# Patient Record
Sex: Female | Born: 1975 | Race: White | Hispanic: No | Marital: Married | Smoking: Never smoker
Health system: Southern US, Community
[De-identification: ages and names within clinical notes are randomized; demographics above are authoritative.]

## PROBLEM LIST (undated history)

## (undated) DIAGNOSIS — Z923 Personal history of irradiation: Secondary | ICD-10-CM

## (undated) DIAGNOSIS — Z8042 Family history of malignant neoplasm of prostate: Secondary | ICD-10-CM

## (undated) DIAGNOSIS — Z808 Family history of malignant neoplasm of other organs or systems: Secondary | ICD-10-CM

## (undated) DIAGNOSIS — K219 Gastro-esophageal reflux disease without esophagitis: Secondary | ICD-10-CM

## (undated) DIAGNOSIS — F329 Major depressive disorder, single episode, unspecified: Secondary | ICD-10-CM

## (undated) DIAGNOSIS — Z8049 Family history of malignant neoplasm of other genital organs: Secondary | ICD-10-CM

## (undated) DIAGNOSIS — T7840XA Allergy, unspecified, initial encounter: Secondary | ICD-10-CM

## (undated) DIAGNOSIS — F32A Depression, unspecified: Secondary | ICD-10-CM

## (undated) HISTORY — PX: ADENOIDECTOMY: SHX5191

## (undated) HISTORY — DX: Family history of malignant neoplasm of prostate: Z80.42

## (undated) HISTORY — DX: Allergy, unspecified, initial encounter: T78.40XA

## (undated) HISTORY — DX: Depression, unspecified: F32.A

## (undated) HISTORY — DX: Family history of malignant neoplasm of other genital organs: Z80.49

## (undated) HISTORY — DX: Gastro-esophageal reflux disease without esophagitis: K21.9

## (undated) HISTORY — DX: Family history of malignant neoplasm of other organs or systems: Z80.8

## (undated) HISTORY — PX: APPENDECTOMY: SHX54

## (undated) HISTORY — DX: Major depressive disorder, single episode, unspecified: F32.9

## (undated) HISTORY — PX: BREAST LUMPECTOMY: SHX2

---

## 1998-05-05 ENCOUNTER — Encounter: Payer: Self-pay | Admitting: Emergency Medicine

## 1998-05-05 ENCOUNTER — Emergency Department (HOSPITAL_COMMUNITY): Admission: EM | Admit: 1998-05-05 | Discharge: 1998-05-05 | Payer: Self-pay | Admitting: Emergency Medicine

## 2000-11-05 ENCOUNTER — Encounter: Admission: RE | Admit: 2000-11-05 | Discharge: 2000-11-05 | Payer: Self-pay | Admitting: Endocrinology

## 2000-11-05 ENCOUNTER — Encounter: Payer: Self-pay | Admitting: Endocrinology

## 2000-11-06 ENCOUNTER — Encounter: Payer: Self-pay | Admitting: Endocrinology

## 2000-11-06 ENCOUNTER — Encounter: Admission: RE | Admit: 2000-11-06 | Discharge: 2000-11-06 | Payer: Self-pay | Admitting: Endocrinology

## 2000-12-25 ENCOUNTER — Encounter (INDEPENDENT_AMBULATORY_CARE_PROVIDER_SITE_OTHER): Payer: Self-pay | Admitting: *Deleted

## 2000-12-25 ENCOUNTER — Observation Stay (HOSPITAL_COMMUNITY): Admission: EM | Admit: 2000-12-25 | Discharge: 2000-12-26 | Payer: Self-pay | Admitting: Emergency Medicine

## 2002-07-20 ENCOUNTER — Other Ambulatory Visit: Admission: RE | Admit: 2002-07-20 | Discharge: 2002-07-20 | Payer: Self-pay | Admitting: Obstetrics and Gynecology

## 2003-09-13 ENCOUNTER — Ambulatory Visit (HOSPITAL_COMMUNITY): Admission: RE | Admit: 2003-09-13 | Discharge: 2003-09-13 | Payer: Self-pay | Admitting: Obstetrics and Gynecology

## 2003-09-13 ENCOUNTER — Encounter (INDEPENDENT_AMBULATORY_CARE_PROVIDER_SITE_OTHER): Payer: Self-pay | Admitting: Specialist

## 2003-09-26 ENCOUNTER — Other Ambulatory Visit: Admission: RE | Admit: 2003-09-26 | Discharge: 2003-09-26 | Payer: Self-pay | Admitting: Obstetrics and Gynecology

## 2004-09-13 ENCOUNTER — Encounter (INDEPENDENT_AMBULATORY_CARE_PROVIDER_SITE_OTHER): Payer: Self-pay | Admitting: *Deleted

## 2004-09-13 ENCOUNTER — Inpatient Hospital Stay (HOSPITAL_COMMUNITY): Admission: AD | Admit: 2004-09-13 | Discharge: 2004-09-16 | Payer: Self-pay | Admitting: Obstetrics and Gynecology

## 2004-10-23 ENCOUNTER — Other Ambulatory Visit: Admission: RE | Admit: 2004-10-23 | Discharge: 2004-10-23 | Payer: Self-pay | Admitting: Obstetrics and Gynecology

## 2008-01-07 ENCOUNTER — Encounter: Admission: RE | Admit: 2008-01-07 | Discharge: 2008-01-31 | Payer: Self-pay | Admitting: Psychiatry

## 2008-02-04 ENCOUNTER — Encounter: Admission: RE | Admit: 2008-02-04 | Discharge: 2008-04-10 | Payer: Self-pay | Admitting: Psychiatry

## 2008-05-29 ENCOUNTER — Encounter: Admission: RE | Admit: 2008-05-29 | Discharge: 2008-08-27 | Payer: Self-pay | Admitting: Psychiatry

## 2010-08-16 NOTE — Op Note (Signed)
NAME:  [name], [name]              ACCOUNT NO.:  [id number]   MEDICAL RECORD NO.:  [id number]          PATIENT TYPE:  INP   LOCATION:  9160                          FACILITY:  [facility]   PHYSICIAN:  Duke Salvia. Marcelle Overlie, M.D.DATE OF BIRTH:  02/17/1976   DATE OF PROCEDURE:  09/13/2004  DATE OF DISCHARGE:                                 OPERATIVE REPORT   PREOPERATIVE DIAGNOSES:  1.  Term intrauterine pregnancy, history of positive GBS.  2.  Chorioamnionitis.  3.  Severe fetal tachycardia with fetal intolerance of labor.   POSTOPERATIVE DIAGNOSES:  1.  Term intrauterine pregnancy, history of positive GBS.  2.  Chorioamnionitis.  3.  Severe fetal tachycardia with fetal intolerance of labor.  4.  OP presentation.   PROCEDURE:  Primary low transverse cesarean section.   SURGEON:  Dr. Marcelle Overlie   ANESTHESIA:  Epidural.   COMPLICATIONS:  None.   DRAINS:  Foley catheter.   ESTIMATED BLOOD LOSS:  800.   PROCEDURE AND FINDINGS:  The patient to the operating room.  After an  adequate level of epidural anesthesia was obtained with the patient in left  tilt position, the prepped and draped in the usual manner for sterile  abdominal procedures.  Foley catheter positioned draining clear urine.  She  had been on IV ampicillin during her labor, immediately prepartum had a 103  fever with persistent fetal tachycardia in the 210-220 range with decreased  variability.  Decision made to proceed with cesarean section.   After prepping and draping, tranverse Pfannenstiel incision made 2  fingerbreadths above the symphysis, carried down to the fascia which was  incised and extended transversely.  Rectus muscles divided in the midline.  Peritoneum entered superiorly without incident and extended in a vertical  fashion.  The vesicouterine serosa was then incised, and the bladder was  bluntly and sharply dissected off of the lower uterine segment, and a  bladder blade was repositioned.  A transverse  incision made in the lower  uterine segment which was extended with blunt dissection.  Clear fluid was  then noted.  The fetus was noted to be straight OP, was easily rotated to  effect easy delivery of a female.  Apgars were 8 and 9, cord pH 7.29.  The  infant was suctioned, cord clamped, and passed to pediatric team for further  care.  Placenta was delivered manually intact, sent to pathology, uterus  exteriorized, cavity wiped clean with a laparotomy pack, closure obtained in  the first layer of 0 chromic interlocked fashion followed by an imbricating  layer of 0 chromic sutures were used to reapproximate this laceration with  good hemostasis.  After this was sutured initially by palpation, could  palpate the ureter lateral and below this operative site.  The remainder of  the incision was closed with running locked 0 chromic suture followed by an  imbricating later of 0 chromic.  This was hemostatic, and the bladder flap  area was intact and hemostatic.  Tubes and ovaries were normal.  Prior to  closure, sponge, needle, and instrument counts were reported as correct x 2.  The peritoneum closed with a running 3-0 Vicryl suture.  Rectus muscles  reapproximated in the midline with 3-0 Vicryl interrupted sutures.  Fascia  closed from laterally to midline on either side with a 0 PDS suture.  Subcutaneous fat was hemostatic.  Clips and Steri-Strips used on the skin.  She tolerated this well and went to recovery room in good condition.  She  did receive Unasyn 3 g IV after the cord was clamped, and this will be  continued.       RMH/MEDQ  D:  09/13/2004  T:  09/13/2004  Job:  [id number]

## 2010-08-16 NOTE — Discharge Summary (Signed)
NAME:  [name], [name]              ACCOUNT NO.:  [id number]   MEDICAL RECORD NO.:  [id number]          PATIENT TYPE:  INP   LOCATION:  9110                          FACILITY:  [facility]   PHYSICIAN:  Guy Sandifer. Henderson Cloud, M.D. DATE OF BIRTH:  05-13-1975   DATE OF ADMISSION:  09/13/2004  DATE OF DISCHARGE:  09/16/2004                                 DISCHARGE SUMMARY   ADMITTING DIAGNOSIS:  1.  Intrauterine pregnancy at term  2.  Spontaneous onset of labor  3.  History of positive group B beta strep.   DISCHARGE DIAGNOSIS:  1.  Status post low transverse cesarean section secondary to fetal      intolerance of labor  2.  Chorioamnionitis  3.  Viable female infant.   PROCEDURE:  Primary low transverse cesarean section.   REASON FOR ADMISSION:  Please see written H&P.   HOSPITAL COURSE:  The patient is 35 year old gravida 2, para 0 that was  admitted to [facility] in labor. Pregnancy had been  otherwise uncomplicated. 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  On admission vital signs were stable with temperature 98.9, blood pressure  120/78. Fetal heart tones were reactive at 142 beats per minute, cervix was  examined which revealed cervical dilatation of 2 cm, 50% effaced vertex.  Artificial rupture of membranes was performed revealing clear fluid. The  patient was started on IV antibiotics prophylactically. An epidural was  administered for pain control. Later that afternoon maternal temperature was  noted to be 103 with persistent fetal tachycardia greater than 200 beats per  minute. No obvious decelerations but decreased beat-to-beat variability.  Decision was made to proceed with the primary cesarean delivery. The patient  was then transferred to the operating room where epidural was dosed to an  adequate surgical level. A low transverse incision was made with delivery of  a viable female infant weighing 7 pounds 3 ounces Apgars of 7 at one minute  and 8 at 5  minutes. Arterial cord pH of 7.29. The patient tolerated  procedure well and taken to the recovery room in stable condition. On  postoperative day #1 the patient was without complaint. Vital signs stable.  She was afebrile. Abdomen soft with good return of bowel function.  Laboratory findings revealed hemoglobin of 10.4. WBC count of 17.2. The  patient continued on IV Unasyn. On postoperative day #2 the patient was  without complaint. She was afebrile. Abdomen soft. Abdominal dressing had  been removed revealing an incision that is clean, dry and intact. Fundus was  firm and nontender. IV antibiotics were discontinued and the patient was  started on oral Augmentin. On postoperative day #3 the patient was without  complaint. Vital signs were stable. She was afebrile. Abdomen soft. Fundus  was firm and nontender and decision was clean, dry and intact. Staples were  removed and the patient was discharged home.   CONDITION ON DISCHARGE:  Good.   DIET:  Regular as tolerated.   ACTIVITY:  No heavy lifting, no driving x2 weeks, no vaginal entry.   FOLLOW-UP:  Patient follow up in  the office in 1-2 weeks for incision check.  She is to call for temperature greater than 100 degrees, persistent nausea  and vomiting, heavy vaginal bleeding and/or redness or drainage from  incisional site.   DISCHARGE MEDICATIONS:  1.  Percocet 5/325 #30 one p.o. every 4 to 6 hours p.r.n.  2.  Motrin 600 milligrams every 6 hours  3.  Augmentin 875 milligrams one p.o. b.i.d. x7 days.      Julio Sicks, N.P.      Guy Sandifer. Henderson Cloud, M.D.  Electronically Signed    CC/MEDQ  D:  10/21/2004  T:  10/21/2004  Job:  [id number]

## 2011-04-21 ENCOUNTER — Encounter (INDEPENDENT_AMBULATORY_CARE_PROVIDER_SITE_OTHER): Payer: 59 | Admitting: Family Medicine

## 2011-04-21 ENCOUNTER — Encounter: Payer: Self-pay | Admitting: Family Medicine

## 2011-04-21 DIAGNOSIS — Z0000 Encounter for general adult medical examination without abnormal findings: Secondary | ICD-10-CM

## 2011-04-21 DIAGNOSIS — F329 Major depressive disorder, single episode, unspecified: Secondary | ICD-10-CM | POA: Insufficient documentation

## 2011-04-21 DIAGNOSIS — F418 Other specified anxiety disorders: Secondary | ICD-10-CM | POA: Insufficient documentation

## 2011-04-21 DIAGNOSIS — G43009 Migraine without aura, not intractable, without status migrainosus: Secondary | ICD-10-CM | POA: Insufficient documentation

## 2011-04-21 DIAGNOSIS — K219 Gastro-esophageal reflux disease without esophagitis: Secondary | ICD-10-CM

## 2011-07-28 ENCOUNTER — Other Ambulatory Visit: Payer: Self-pay | Admitting: Family Medicine

## 2011-11-20 ENCOUNTER — Other Ambulatory Visit: Payer: Self-pay | Admitting: Internal Medicine

## 2011-12-11 ENCOUNTER — Other Ambulatory Visit: Payer: Self-pay | Admitting: Internal Medicine

## 2012-05-17 ENCOUNTER — Other Ambulatory Visit: Payer: Self-pay | Admitting: Physician Assistant

## 2012-07-09 ENCOUNTER — Other Ambulatory Visit: Payer: Self-pay | Admitting: Physician Assistant

## 2012-07-09 NOTE — Telephone Encounter (Signed)
LMOM to CB w/follow-up plans. Has been over a year since last OV.

## 2012-07-09 NOTE — Telephone Encounter (Signed)
Pt called back and reported that she did not get the message on prescriptions that she was due for f/up. I transferred pt to 104 to set up appt to est care w/new provider. She will try to see someone w/in the month. Can we send in a RF of these meds?

## 2012-07-13 ENCOUNTER — Encounter: Payer: Self-pay | Admitting: Family Medicine

## 2012-07-13 ENCOUNTER — Other Ambulatory Visit: Payer: Self-pay | Admitting: Physician Assistant

## 2012-07-13 ENCOUNTER — Ambulatory Visit (INDEPENDENT_AMBULATORY_CARE_PROVIDER_SITE_OTHER): Payer: 59 | Admitting: Family Medicine

## 2012-07-13 VITALS — BP 126/82 | HR 104 | Temp 98.4°F | Resp 18 | Ht 63.5 in | Wt 239.0 lb

## 2012-07-13 DIAGNOSIS — K219 Gastro-esophageal reflux disease without esophagitis: Secondary | ICD-10-CM

## 2012-07-13 DIAGNOSIS — H60399 Other infective otitis externa, unspecified ear: Secondary | ICD-10-CM

## 2012-07-13 DIAGNOSIS — H60391 Other infective otitis externa, right ear: Secondary | ICD-10-CM

## 2012-07-13 DIAGNOSIS — Z760 Encounter for issue of repeat prescription: Secondary | ICD-10-CM

## 2012-07-13 DIAGNOSIS — F329 Major depressive disorder, single episode, unspecified: Secondary | ICD-10-CM

## 2012-07-13 MED ORDER — CEFUROXIME AXETIL 500 MG PO TABS: 500.0000 mg | ORAL_TABLET | Freq: Two times a day (BID) | ORAL | Status: DC

## 2012-07-13 MED ORDER — PANTOPRAZOLE SODIUM 40 MG PO TBEC: 40.0000 mg | DELAYED_RELEASE_TABLET | Freq: Every day | ORAL | Status: AC

## 2012-07-13 MED ORDER — ANTIPYRINE-BENZOCAINE 5.4-1.4 % OT SOLN: 3.0000 [drp] | OTIC | Status: DC | PRN

## 2012-07-13 MED ORDER — SERTRALINE HCL 100 MG PO TABS: ORAL_TABLET | ORAL | Status: DC

## 2012-07-13 NOTE — Patient Instructions (Signed)
Otitis Externa Otitis externa is a bacterial or fungal infection of the outer ear canal. This is the area from the eardrum to the outside of the ear. Otitis externa is sometimes called "swimmer's ear." CAUSES  Possible causes of infection include:  Swimming in dirty water.  Moisture remaining in the ear after swimming or bathing.  Mild injury (trauma) to the ear.  Objects stuck in the ear (foreign body).  Cuts or scrapes (abrasions) on the outside of the ear. SYMPTOMS  The first symptom of infection is often itching in the ear canal. Later signs and symptoms may include swelling and redness of the ear canal, ear pain, and yellowish-white fluid (pus) coming from the ear. The ear pain may be worse when pulling on the earlobe. DIAGNOSIS  Your caregiver will perform a physical exam. A sample of fluid may be taken from the ear and examined for bacteria or fungi. TREATMENT  Antibiotic ear drops are often given for 10 to 14 days. Treatment may also include pain medicine or corticosteroids to reduce itching and swelling. PREVENTION   Keep your ear dry. Use the corner of a towel to absorb water out of the ear canal after swimming or bathing.  Avoid scratching or putting objects inside your ear. This can damage the ear canal or remove the protective wax that lines the canal. This makes it easier for bacteria and fungi to grow.  Avoid swimming in lakes, polluted water, or poorly chlorinated pools.  You may use ear drops made of rubbing alcohol and vinegar after swimming. Combine equal parts of white vinegar and alcohol in a bottle. Put 3 or 4 drops into each ear after swimming. HOME CARE INSTRUCTIONS   Apply antibiotic ear drops to the ear canal as prescribed by your caregiver.  Only take over-the-counter or prescription medicines for pain, discomfort, or fever as directed by your caregiver.  If you have diabetes, follow any additional treatment instructions from your caregiver.  Keep all  follow-up appointments as directed by your caregiver. SEEK MEDICAL CARE IF:   You have a fever.  Your ear is still red, swollen, painful, or draining pus after 3 days.  Your redness, swelling, or pain gets worse.  You have a severe headache.  You have redness, swelling, pain, or tenderness in the area behind your ear. MAKE SURE YOU:   Understand these instructions.  Will watch your condition.  Will get help right away if you are not doing well or get worse. Document Released: 03/17/2005 Document Revised: 06/09/2011 Document Reviewed: 04/03/2011 [employer] Patient Information 2013 [employer].  

## 2012-07-14 ENCOUNTER — Emergency Department (HOSPITAL_COMMUNITY): Payer: 59

## 2012-07-14 ENCOUNTER — Encounter (HOSPITAL_COMMUNITY): Payer: Self-pay | Admitting: *Deleted

## 2012-07-14 DIAGNOSIS — Y9301 Activity, walking, marching and hiking: Secondary | ICD-10-CM | POA: Insufficient documentation

## 2012-07-14 DIAGNOSIS — K219 Gastro-esophageal reflux disease without esophagitis: Secondary | ICD-10-CM | POA: Insufficient documentation

## 2012-07-14 DIAGNOSIS — F3289 Other specified depressive episodes: Secondary | ICD-10-CM | POA: Insufficient documentation

## 2012-07-14 DIAGNOSIS — Y929 Unspecified place or not applicable: Secondary | ICD-10-CM | POA: Insufficient documentation

## 2012-07-14 DIAGNOSIS — F329 Major depressive disorder, single episode, unspecified: Secondary | ICD-10-CM | POA: Insufficient documentation

## 2012-07-14 DIAGNOSIS — W108XXA Fall (on) (from) other stairs and steps, initial encounter: Secondary | ICD-10-CM | POA: Insufficient documentation

## 2012-07-14 DIAGNOSIS — G43909 Migraine, unspecified, not intractable, without status migrainosus: Secondary | ICD-10-CM | POA: Insufficient documentation

## 2012-07-14 DIAGNOSIS — S82899A Other fracture of unspecified lower leg, initial encounter for closed fracture: Secondary | ICD-10-CM | POA: Insufficient documentation

## 2012-07-14 DIAGNOSIS — Z79899 Other long term (current) drug therapy: Secondary | ICD-10-CM | POA: Insufficient documentation

## 2012-07-14 MED ORDER — OXYCODONE-ACETAMINOPHEN 5-325 MG PO TABS
1.0000 | ORAL_TABLET | Freq: Once | ORAL | Status: AC
  Administered 2012-07-14: 1 via ORAL
  Filled 2012-07-14: qty 1

## 2012-07-14 NOTE — ED Notes (Signed)
Pt states she fell down 1 step around 8:30 this evening and twisted her right ankle. C/o right ankle pain and swelling.

## 2012-07-15 ENCOUNTER — Emergency Department (HOSPITAL_COMMUNITY)
Admission: EM | Admit: 2012-07-15 | Discharge: 2012-07-15 | Disposition: A | Discharge status: Home / Self Care | Payer: 59 | Attending: Emergency Medicine | Admitting: Emergency Medicine

## 2012-07-15 DIAGNOSIS — S82891A Other fracture of right lower leg, initial encounter for closed fracture: Secondary | ICD-10-CM

## 2012-07-15 MED ORDER — OXYCODONE-ACETAMINOPHEN 5-325 MG PO TABS
1.0000 | ORAL_TABLET | Freq: Once | ORAL | Status: AC
  Administered 2012-07-15: 1 via ORAL
  Filled 2012-07-15: qty 1

## 2012-07-15 MED ORDER — HYDROCODONE-ACETAMINOPHEN 5-325 MG PO TABS: 1.0000 | ORAL_TABLET | Freq: Four times a day (QID) | ORAL | Status: DC | PRN

## 2012-07-15 MED ORDER — NAPROXEN 500 MG PO TABS: 500.0000 mg | ORAL_TABLET | Freq: Two times a day (BID) | ORAL | Status: DC

## 2012-07-15 NOTE — Progress Notes (Signed)
S:  This 37 y.o. Cauc female is here for follow-up re: Depression and GERD. She is doing well on current medications; pt feels less anxious and irritable. Her mood is stable and she denies concentration or behavior difficulties. Sleep hygiene is better and suicidal ideation is absent. reflux symptoms recur very time she attempts to discontinue medication. Reflux is not food-dependent. Otherwise, GI function is normal.  Pt c/o ear discomfort w/o drainage or fever. Feels like swimmer's ear but pt has not been swimming. She tries to use Q-tip to remove excess water after bathing. Pt denies sore throat or nasal discharge.  ROS: As per HPI.  O: Filed Vitals:   07/13/12 1456  BP: 126/82  Pulse: 104  Temp: 98.4 F (36.9 C)  Resp: 18   GEN: In NAD; Wn,WD. HEENT: [place]/AT. EOMI w/ clear conj/sclerae. R EAC-erythematous and slightly edematous. TMs-normal. Nasal mucosa/ post ph normal. NECK: No LAN or TMG. COR: RRR. LUNGS: Normal resp rate and effort. ABD: Soft; mild epig tenderness w/o guarding, HSM, masses or rebound. SKIN: W&D. NEURO: A&O x 3; CNs intact. Nonfocal.  A/P: Depression- continue Sertraline.  GERD (gastroesophageal reflux disease)- continue medication w/ food anti-reflux measures.  Otitis, externa, infective, right- RX: Cefuroxime 500 mg bid x 7 days plus otic gtts.  Issue of repeat prescriptions  Meds ordered this encounter  Medications  . promethazine (PHENERGAN) 12.5 MG tablet    Sig: Take 12.5 mg by mouth every 6 (six) hours as needed for nausea.  . rizatriptan (MAXALT) 10 MG tablet    Sig: Take 10 mg by mouth as needed for migraine. May repeat in 2 hours if needed  . cyanocobalamin 100 MCG tablet    Sig: Take 100 mcg by mouth daily.  . Multiple Vitamin (MULTIVITAMIN) tablet    Sig: Take 1 tablet by mouth daily.  . pantoprazole (PROTONIX) 40 MG tablet    Sig: Take 1 tablet (40 mg total) by mouth daily.    Dispense:  90 tablet    Refill:  1  . DISCONTD: sertraline  (ZOLOFT) 100 MG tablet    Sig: TAKE 1&1/2 TABLETS ONCE DAILY.    Dispense:  140 tablet    Refill:  1  . antipyrine-benzocaine (AURALGAN) otic solution    Sig: Place 3 drops into the right ear every 2 (two) hours as needed for pain.    Dispense:  10 mL    Refill:  0  . cefUROXime (CEFTIN) 500 MG tablet    Sig: Take 1 tablet (500 mg total) by mouth 2 (two) times daily.    Dispense:  14 tablet    Refill:  0

## 2012-07-15 NOTE — ED Provider Notes (Signed)
Medical screening examination/treatment/procedure(s) were performed by non-physician practitioner and as supervising physician I was immediately available for consultation/collaboration.  Harveer Sadler M Emmelia Holdsworth, MD 07/15/12 0645 

## 2012-07-15 NOTE — ED Provider Notes (Signed)
History     CSN: [id number]  Arrival date & time 07/14/12  2242   First MD Initiated Contact with Patient 07/15/12 0023      Chief Complaint  Patient presents with  . Fall  . Ankle Pain    (Consider location/radiation/quality/duration/timing/severity/associated sxs/prior treatment) Patient is a 37 y.o. female presenting with fall. The history is provided by the patient.  Fall The accident occurred 3 to 5 hours ago. The fall occurred while walking (dwn stairs). She fell from a height of 1 to 2 ft. She landed on a hard floor. There was no blood loss. Point of impact: R ankle. Pain location: R ankle. The pain is at a severity of 5/10. The pain is moderate. She was not ambulatory at the scene. There was no entrapment after the fall. There was no drug use involved in the accident. There was no alcohol use involved in the accident. Pertinent negatives include no numbness, no abdominal pain, no nausea, no headaches, no loss of consciousness and no tingling. The symptoms are aggravated by activity, flexion, extension, pressure on the injury, standing, use of the injured limb, rotation and ambulation. She has tried nothing for the symptoms. The treatment provided no relief.    Past Medical History  Diagnosis Date  . Depression   . Migraine   . GERD (gastroesophageal reflux disease)   . Allergy     Past Surgical History  Procedure Laterality Date  . Appendectomy    . Adenoidectomy    . Cesarean section      Family History  Problem Relation Age of Onset  . Hypertension Father   . Hypertension Mother   . Heart failure Maternal Grandmother   . Emphysema Maternal Grandmother   . Diabetes Maternal Grandfather   . Parkinson's disease Paternal Grandmother   . Heart disease Paternal Grandfather     History  Substance Use Topics  . Smoking status: Never Smoker   . Smokeless tobacco: Not on file  . Alcohol Use: Yes    OB History   Grav Para Term Preterm Abortions TAB SAB Ect Mult  Living                  Review of Systems  Gastrointestinal: Negative for nausea and abdominal pain.  Neurological: Negative for tingling, loss of consciousness, numbness and headaches.  All other systems reviewed and are negative.    Allergies  Review of patient's allergies indicates no known allergies.  Home Medications   Current Outpatient Rx  Name  Route  Sig  Dispense  Refill  . antipyrine-benzocaine (AURALGAN) otic solution   Right Ear   Place 3 drops into the right ear every 2 (two) hours as needed for pain.   10 mL   0   . cefUROXime (CEFTIN) 500 MG tablet   Oral   Take 1 tablet (500 mg total) by mouth 2 (two) times daily.   14 tablet   0   . cyanocobalamin 100 MCG tablet   Oral   Take 100 mcg by mouth daily.         . Multiple Vitamin (MULTIVITAMIN) tablet   Oral   Take 1 tablet by mouth daily.         . naproxen (NAPROSYN) 500 MG tablet   Oral   Take 500 mg by mouth 2 (two) times daily as needed (for pain).          Marland Kitchen NUVARING 0.12-0.015 MG/24HR vaginal ring   Vaginal  Place 1 each vaginally every 30 (thirty) days.         . pantoprazole (PROTONIX) 40 MG tablet   Oral   Take 1 tablet (40 mg total) by mouth daily.   90 tablet   1   . promethazine (PHENERGAN) 12.5 MG tablet   Oral   Take 12.5 mg by mouth every 6 (six) hours as needed for nausea.         . rizatriptan (MAXALT) 10 MG tablet   Oral   Take 10 mg by mouth as needed for migraine. May repeat in 2 hours if needed         . sertraline (ZOLOFT) 100 MG tablet   Oral   Take 150 mg by mouth daily.         . SUMAtriptan (IMITREX) 100 MG tablet   Oral   Take 100 mg by mouth every 2 (two) hours as needed for migraine.          Marland Kitchen zolpidem (AMBIEN) 5 MG tablet   Oral   Take 5 mg by mouth at bedtime as needed for sleep.            BP 147/62  Pulse 111  Temp(Src) 99.1 F (37.3 C) (Oral)  Resp 16  SpO2 95%  LMP 07/11/2012  Physical Exam  Nursing note and vitals  reviewed. Constitutional: She is oriented to person, place, and time. She appears well-developed and well-nourished. No distress.  HENT:  Head: Normocephalic and atraumatic.  Eyes: Conjunctivae and EOM are normal.  Neck: Normal range of motion.  Cardiovascular:  Intact distal pulses  Pulmonary/Chest: Effort normal.  Musculoskeletal: Normal range of motion.  Tenderness to palpation over medial malleolus.  Mild swelling of the ankle joint.  Pain with inversion and eversion of ankle.  Normal range of motion of toes.  No tenderness to palpation over metatarsal region. No proximal fibula tenderness  Neurological: She is alert and oriented to person, place, and time.  Strength 5 out of 5 in extremities bilaterally. sensation intact  Skin: Skin is warm and dry. No rash noted. She is not diaphoretic.  Superficial abrasions to left anterior leg  Psychiatric: She has a normal mood and affect. Her behavior is normal.    ED Course  Procedures (including critical care time)  Labs Reviewed - No data to display Dg Ankle Complete Right  07/14/2012  *RADIOLOGY REPORT*  Clinical Data: Lateral pain and swelling secondary to a twisting fall.  RIGHT ANKLE - COMPLETE 3+ VIEW  Comparison: None.  Findings: There is an acute fracture of an old avulsion or accessory ossification center adjacent to the tip of the lateral malleolus.  Prominent soft tissue swelling.  Slight spurring at the lateral aspect of the calcaneus.  No other acute abnormality.  IMPRESSION: Fracture of an accessory ossicle or old avulsion of the tip of the lateral malleolus.   Original Report Authenticated By: Francene Boyers, M.D.      No diagnosis found.    MDM  Ankle fracture Pt to ER w ankle injury s/p mechanical fall. Tdap up to date. Nerovascularly intact on exam, no muscle soreness. Imaging as above. Pt placed in hard splint, conservative hm tx & return precautions discussed. Ortho follow up and pain meds given.          Jaci Carrel, [place] 07/15/12 0128

## 2012-07-15 NOTE — Progress Notes (Signed)
Orthopedic Tech Progress Note Patient Details:  [name] [birth year] [id number]  Ortho Devices Type of Ortho Device: Stirrup splint;Post (short) splint;Crutches   Haskell Flirt 07/15/2012, 1:57 AM

## 2013-12-17 DIAGNOSIS — H6690 Otitis media, unspecified, unspecified ear: Secondary | ICD-10-CM | POA: Insufficient documentation

## 2014-01-12 ENCOUNTER — Ambulatory Visit (INDEPENDENT_AMBULATORY_CARE_PROVIDER_SITE_OTHER): Payer: 59 | Admitting: Emergency Medicine

## 2014-01-12 VITALS — BP 132/74 | HR 105 | Temp 98.2°F | Resp 16 | Ht 63.5 in | Wt 226.4 lb

## 2014-01-12 DIAGNOSIS — H6092 Unspecified otitis externa, left ear: Secondary | ICD-10-CM

## 2014-01-12 MED ORDER — NEOMYCIN-POLYMYXIN-HC 3.5-10000-1 OT SUSP: 4.0000 [drp] | Freq: Four times a day (QID) | OTIC | Status: DC

## 2014-01-12 NOTE — Patient Instructions (Signed)

## 2014-01-12 NOTE — Progress Notes (Signed)
[facility] [address], [place] [address] [phone]  Date:  01/12/2014   Name:  [name]   DOB:  05-28-1975   MRN:  [id number]  PCP:  Hayden Rasmussen., MD    Chief Complaint: Ear Pain   History of Present Illness:  [name] is a 38 y.o. very pleasant female patient who presents with the following:  Patient was treated for a right OM 2 weeks ago. Resolved with omnicef treatment Awoke this morning with left ear pain and pain around the ear.   No fever, chills, cough, coryza.  No wheezing or shortness of breath. No sore throat. No improvement with over the counter medications or other home remedies.  Denies other complaint or health concern today.   Patient Active Problem List   Diagnosis Date Noted  . Migraine 04/21/2011  . GERD (gastroesophageal reflux disease) 04/21/2011  . Depression 04/21/2011    Past Medical History  Diagnosis Date  . Depression   . Migraine   . GERD (gastroesophageal reflux disease)   . Allergy     Past Surgical History  Procedure Laterality Date  . Appendectomy    . Adenoidectomy    . Cesarean section      History  Substance Use Topics  . Smoking status: Never Smoker   . Smokeless tobacco: Not on file  . Alcohol Use: Yes    Family History  Problem Relation Age of Onset  . Hypertension Father   . Hypertension Mother   . Heart failure Maternal Grandmother   . Emphysema Maternal Grandmother   . Diabetes Maternal Grandfather   . Parkinson's disease Paternal Grandmother   . Heart disease Paternal Grandfather     No Known Allergies  Medication list has been reviewed and updated.  Current Outpatient Prescriptions on File Prior to Visit  Medication Sig Dispense Refill  . antipyrine-benzocaine (AURALGAN) otic solution Place 3 drops into the right ear every 2 (two) hours as needed for pain.  10 mL  0  . NUVARING 0.12-0.015 MG/24HR vaginal ring Place 1 each vaginally every 30 (thirty) days.       . pantoprazole (PROTONIX) 40 MG tablet Take 1 tablet (40 mg total) by mouth daily.  90 tablet  1  . promethazine (PHENERGAN) 12.5 MG tablet Take 12.5 mg by mouth every 6 (six) hours as needed for nausea.      . rizatriptan (MAXALT) 10 MG tablet Take 10 mg by mouth as needed for migraine. May repeat in 2 hours if needed      . sertraline (ZOLOFT) 100 MG tablet Take 150 mg by mouth daily.      . SUMAtriptan (IMITREX) 100 MG tablet Take 100 mg by mouth every 2 (two) hours as needed for migraine.       Marland Kitchen zolpidem (AMBIEN) 5 MG tablet Take 5 mg by mouth at bedtime as needed for sleep.       . cefUROXime (CEFTIN) 500 MG tablet Take 1 tablet (500 mg total) by mouth 2 (two) times daily.  14 tablet  0  . cyanocobalamin 100 MCG tablet Take 100 mcg by mouth daily.      Marland Kitchen HYDROcodone-acetaminophen (NORCO/VICODIN) 5-325 MG per tablet Take 1 tablet by mouth every 6 (six) hours as needed for pain.  15 tablet  0  . Multiple Vitamin (MULTIVITAMIN) tablet Take 1 tablet by mouth daily.      . naproxen (NAPROSYN) 500 MG tablet Take 1 tablet (500 mg  total) by mouth 2 (two) times daily.  30 tablet  0   No current facility-administered medications on file prior to visit.    Review of Systems:  As per HPI, otherwise negative.  Marland Kitchen  Physical Examination: Filed Vitals:   01/12/14 1909  BP: 132/74  Pulse: 105  Temp: 98.2 F (36.8 C)  Resp: 16   Filed Vitals:   01/12/14 1909  Height: 5' 3.5" (1.613 m)  Weight: 226 lb 6.4 oz (102.694 kg)   Body mass index is 39.47 kg/(m^2). Ideal Body Weight: Weight in (lb) to have BMI = 25: 143.1   GEN: WDWN, NAD, Non-toxic, Alert & Oriented x 3 HEENT: Atraumatic, Normocephalic.   Left external otitis Ears and Nose: No external deformity. EXTR: No clubbing/cyanosis/edema NEURO: Normal gait.  PSYCH: Normally interactive. Conversant. Not depressed or anxious appearing.  Calm demeanor.    Assessment and Plan: Otitis externa Cortisporin  Signed,  Ellison Carwin, MD

## 2014-05-08 ENCOUNTER — Other Ambulatory Visit: Payer: Self-pay | Admitting: Family Medicine

## 2016-01-31 DIAGNOSIS — L309 Dermatitis, unspecified: Secondary | ICD-10-CM | POA: Insufficient documentation

## 2017-01-05 DIAGNOSIS — E7800 Pure hypercholesterolemia, unspecified: Secondary | ICD-10-CM | POA: Insufficient documentation

## 2018-02-02 DIAGNOSIS — E669 Obesity, unspecified: Secondary | ICD-10-CM | POA: Insufficient documentation

## 2018-08-30 DIAGNOSIS — C50919 Malignant neoplasm of unspecified site of unspecified female breast: Secondary | ICD-10-CM

## 2018-08-30 HISTORY — DX: Malignant neoplasm of unspecified site of unspecified female breast: C50.919

## 2018-09-15 ENCOUNTER — Other Ambulatory Visit: Payer: Self-pay | Admitting: Obstetrics and Gynecology

## 2018-09-15 DIAGNOSIS — R928 Other abnormal and inconclusive findings on diagnostic imaging of breast: Secondary | ICD-10-CM

## 2018-09-22 ENCOUNTER — Ambulatory Visit
Admission: RE | Admit: 2018-09-22 | Discharge: 2018-09-22 | Disposition: A | Discharge status: Home / Self Care | Payer: 59 | Source: Ambulatory Visit | Attending: Obstetrics and Gynecology | Admitting: Obstetrics and Gynecology

## 2018-09-22 ENCOUNTER — Other Ambulatory Visit: Payer: Self-pay | Admitting: Obstetrics and Gynecology

## 2018-09-22 ENCOUNTER — Other Ambulatory Visit: Payer: Self-pay

## 2018-09-22 ENCOUNTER — Encounter (INDEPENDENT_AMBULATORY_CARE_PROVIDER_SITE_OTHER): Payer: Self-pay

## 2018-09-22 DIAGNOSIS — R928 Other abnormal and inconclusive findings on diagnostic imaging of breast: Secondary | ICD-10-CM

## 2018-09-22 DIAGNOSIS — N6310 Unspecified lump in the right breast, unspecified quadrant: Secondary | ICD-10-CM

## 2018-09-27 ENCOUNTER — Ambulatory Visit
Admission: RE | Admit: 2018-09-27 | Discharge: 2018-09-27 | Disposition: A | Discharge status: Home / Self Care | Payer: 59 | Source: Ambulatory Visit | Attending: Obstetrics and Gynecology | Admitting: Obstetrics and Gynecology

## 2018-09-27 ENCOUNTER — Other Ambulatory Visit: Payer: Self-pay

## 2018-09-27 DIAGNOSIS — N6310 Unspecified lump in the right breast, unspecified quadrant: Secondary | ICD-10-CM

## 2018-10-04 ENCOUNTER — Ambulatory Visit: Payer: Self-pay | Admitting: Surgery

## 2018-10-04 DIAGNOSIS — C50911 Malignant neoplasm of unspecified site of right female breast: Secondary | ICD-10-CM

## 2018-10-04 NOTE — H&P (View-Only) (Signed)
[name] Documented: 10/04/2018 2:33 PM Location: [facility] Patient #: [id number] DOB: 03-14-1976 Married / Language: Cleophus Molt / Race: White Female  History of Present Illness Kaitlyn Moores A. Catlyn Shipton MD; 10/04/2018 3:49 PM) Patient words: Patient sent at the request of Lajean Manes M.D. for abnormal screening mammogram. She underwent screening mammography which showed an 8 mm lesion right breast lower outer quadrant. 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 She has no family history of breast cancer other malignancies. She is otherwise in good health she states. The patient denies any history of breast pain, breast mass, nipple discharge or other problem with either breast.                  CLINICAL DATA: Screening recall for possible right breast mass.  EXAM: DIGITAL DIAGNOSTIC RIGHT MAMMOGRAM WITH CAD AND TOMO  ULTRASOUND RIGHT BREAST  COMPARISON: Previous exam(s).  ACR Breast Density Category c: The breast tissue is heterogeneously dense, which may obscure small masses.  FINDINGS: The possible mass noted in the posterior, inferior right breast persists on the spot compression imaging. It measures approximately 7 mm in size with partly irregular margins.  Mammographic images were processed with CAD.  On physical exam, no mass is palpated along the inferior right breast.  Targeted ultrasound is performed, showing a hypoechoic oval, vascular mass in the right breast at 6:30 o'clock, 15 cm the nipple, measuring 8 x 5 x 7 mm. There is a tiny adjacent mass, with a 3 mm, measuring 2 mm. Sonographic evaluation of the right axilla shows no enlarged or abnormal lymph nodes.  IMPRESSION: 1. 8 mm mass in the inferior right breast highly suspicious for breast carcinoma. Tissue sampling is warranted.  RECOMMENDATION: 1. Ultrasound-guided core needle biopsy of the 8 mm mass at the 6:30 o'clock position the right breast.  I  have discussed the findings and recommendations with the patient. Results were also provided in writing at the conclusion of the visit. If applicable, a reminder letter will be sent to the patient regarding the next appointment.  BI-RADS CATEGORY 5: Highly suggestive of malignancy.   Electronically Signed By: Lajean Manes M.D. On: 09/22/2018 14:16                     ADDITIONAL INFORMATION: PROGNOSTIC INDICATORS Results: IMMUNOHISTOCHEMICAL AND MORPHOMETRIC ANALYSIS PERFORMED MANUALLY The tumor cells are NEGATIVE for Her2 (0). Estrogen Receptor: 0%, NEGATIVE Progesterone Receptor: 0%, NEGATIVE Proliferation Marker Ki67: 90% COMMENT: The negative hormone receptor study(ies) in this case has An internal positive control. REFERENCE RANGE ESTROGEN RECEPTOR NEGATIVE 0% POSITIVE =>1% REFERENCE RANGE PROGESTERONE RECEPTOR NEGATIVE 0% POSITIVE =>1% All controls stained appropriately Enid Cutter MD Pathologist, Electronic Signature ( Signed 09/30/2018) FINAL DIAGNOSIS 1 of 2 FINAL for [name], [name] [phone]) Diagnosis Breast, right, needle core biopsy, lower outer 6:30 o'clock position - INVASIVE DUCTAL CARCINOMA. - DUCTAL CARCINOMA IN SITU. - SEE COMMENT. Microscopic Comment The carcinoma is grade III. A breast prognostic profile will be performed and the results reported separately. The results were called to the [facility] on 09/28/2018. (JBK:kh 09/28/2018) Enid Cutter MD Pathologist, Electronic Signature (Case signed 09/28/2018) Specimen Gross and Clinical Information Specimen Comment In formalin 8:45, extracted < 65 min; 43 year old female with immediately adjacent 0.8 cm and 0.2 cm masses in LO right breast Specimen(s) Obtained: Breast, right, needle core biopsy, lower outer 6:30 o'clock position Gross Received labeled as "[name],[name]" and "Rt breast 6:30  LO breast" (TIF 0845 CIT <1 min) are 4 cores of yellow  to gray white soft to firm tissue, ranging from 0.9 x 0.15 x 0.15 cm to 1.4 x 0.15 x 0.15 cm. One block submitted. (SSW 6/29) Stain(s) used in Diagnosis: The following stain(s) were used in diagnosing the case: ER-ACIS, PR-ACIS, Her2 by IHC, KI-67-ACIS. The control(s) stained appropriately. Disclaimer Estrogen receptor (6F11), immunohistochemical stains are performed on formalin fixed, paraffin embedded tissue using a 3,3"-diaminobenzidine (DAB) chromogen and Leica Bond Autostainer System. The staining intensity of the nucleus is scored manually and is reported as the percentage of tumor cell nuclei demonstrating specific nuclear staining.Specimens are fixed in 10% Neutral Buffered Formalin for at least 6 hours and up to 72 hours. These tests have not be validated on decalcified tissue. Results should be interpreted with caution given the possibility of false negative results on decalcified specimens. IHC scores are reported using ASCO/CAP scoring criteria. An IHC Score of 0 or 1+ is NEGATIVE for HER2, 3+ is POSITIVE for HER2, and 2+ is EQUIVOCAL. Equivocal results are reflexed to either FISH or IHC testing. Specimens are fixed in 10% Neutral Buffered Formalin for at least 6 hours and up to 72 hours. These tests have not be validated on decalcified tissue. Results should be interpreted with caution given the possibility of false negative results on decalcified specimens. Ki-67 (MM1), immunohistochemical stains are performed on formalin fixed, paraffin embedded tissue using a 3,3"-diaminobenzidine (DAB) chromogen and Leica Bond Autostainer System. The staining intensity of the nucleus is scored manually and is reported as the percentage of tumor cell nuclei demonstrating specific nuclear staining.Specimens are fixed in 10% Neutral Buffered Formalin for at least 6 hours and up to 72 hours. These tests have not be validated on decalcified tissue. Results should be interpreted with caution given the  possibility of false negative results on decalcified specimens. PR progesterone receptor (16), immunohistochemical stains are performed on formalin fixed, paraffin embedded tissue using a 3,3"-diaminobenzidine (DAB) chromogen and Leica Bond Autostainer System. The staining intensity of the nucleus is scored manually and is reported as the percentage of tumor cell nuclei demonstrating specific nuclear staining.Specimens are fixed in 10% Neutral Buffered Formalin for at least 6 hours and up to 72 hours. These tests have.  The patient is a 43 year old female.   Past Surgical History [name], [place]; 10/04/2018 2:33 PM) Appendectomy Breast Biopsy Right. Cesarean Section - 1 Oral Surgery Tonsillectomy  Diagnostic Studies History [name], [place]; 10/04/2018 2:33 PM) Colonoscopy never Mammogram within last year Pap Smear 1-5 years ago  Allergies [name], [place]; 10/04/2018 2:34 PM) No Known Drug Allergies [10/04/2018]: Allergies Reconciled  Medication History [name], CMA; 10/04/2018 2:37 PM) Topiramate (100MG Tablet, Oral) Active. Rizatriptan Benzoate (10MG Tablet, Oral) Active. SUMAtriptan Succinate (100MG Tablet, Oral) Active. Sertraline HCl (100MG Tablet, Oral) Active. NuvaRing (0.12-0.015MG/24HR Ring, Vaginal) Active. Aimovig (70MG/ML Soln Auto-inj, Subcutaneous) Active. Protonix (40MG Packet, Oral) Active. Phenergan (12.5MG Tablet, Oral) Active. traMADol HCl (50MG Tablet, Oral) Active. Medications Reconciled  Social History [name], [place]; 10/04/2018 2:33 PM) Alcohol use Occasional alcohol use. Caffeine use Carbonated beverages, Coffee, Tea. Tobacco use Former smoker.  Family History [name], [place]; 10/04/2018 2:33 PM) Arthritis Mother. Depression Mother, Sister. Hypertension Father. Migraine Headache Mother, Tora Duck, Pandora Leiter.  Pregnancy / Birth History [name], [place]; 10/04/2018 2:33 PM) Age at menarche 81  years. Contraceptive History Contraceptive implant. Gravida 2 Irregular periods Maternal age 5-30 Para 1  Other Problems [name], [place]; 10/04/2018 2:33 PM)  Anxiety Disorder Gastroesophageal Reflux Disease Migraine Headache     Review of Systems [name] CMA; 10/04/2018 2:33 PM) General Not Present- Appetite Loss, Chills, Fatigue, Fever, Night Sweats, Weight Gain and Weight Loss. Skin Not Present- Change in Wart/Mole, Dryness, Hives, Jaundice, New Lesions, Non-Healing Wounds, Rash and Ulcer. HEENT Present- Wears glasses/contact lenses. Not Present- Earache, Hearing Loss, Hoarseness, Nose Bleed, Oral Ulcers, Ringing in the Ears, Seasonal Allergies, Sinus Pain, Sore Throat, Visual Disturbances and Yellow Eyes. Breast Present- Breast Mass. Not Present- Breast Pain, Nipple Discharge and Skin Changes. Cardiovascular Not Present- Chest Pain, Difficulty Breathing Lying Down, Leg Cramps, Palpitations, Rapid Heart Rate, Shortness of Breath and Swelling of Extremities. Gastrointestinal Not Present- Abdominal Pain, Bloating, Bloody Stool, Change in Bowel Habits, Chronic diarrhea, Constipation, Difficulty Swallowing, Excessive gas, Gets full quickly at meals, Hemorrhoids, Indigestion, Nausea, Rectal Pain and Vomiting. Female Genitourinary Not Present- Frequency, Nocturia, Painful Urination, Pelvic Pain and Urgency. Musculoskeletal Not Present- Back Pain, Joint Pain, Joint Stiffness, Muscle Pain, Muscle Weakness and Swelling of Extremities. Neurological Not Present- Decreased Memory, Fainting, Headaches, Numbness, Seizures, Tingling, Tremor, Trouble walking and Weakness. Psychiatric Not Present- Anxiety, Bipolar, Change in Sleep Pattern, Depression, Fearful and Frequent crying. Endocrine Not Present- Cold Intolerance, Excessive Hunger, Hair Changes, Heat Intolerance, Hot flashes and New Diabetes. Hematology Not Present- Blood Thinners, Easy Bruising, Excessive bleeding, Gland  problems, HIV and Persistent Infections.  Vitals [name] CMA; 10/04/2018 2:34 PM) 10/04/2018 2:34 PM Weight: 236 lb Height: 64in Body Surface Area: 2.1 m Body Mass Index: 40.51 kg/m  Temp.: 98.22F  Pulse: 112 (Regular)  BP: 140/78 (Sitting, Left Arm, Standard)        Physical Exam (Lester Crickenberger A. Caterine Mcmeans MD; 10/04/2018 3:50 PM)  General Mental Status-Alert. General Appearance-Consistent with stated age. Hydration-Well hydrated. Voice-Normal.  Head and Neck Head-normocephalic, atraumatic with no lesions or palpable masses. Trachea-midline. Thyroid Gland Characteristics - normal size and consistency.  Eye Eyeball - Bilateral-Extraocular movements intact. Sclera/Conjunctiva - Bilateral-No scleral icterus.  Breast Breast - Left-Symmetric, Non Tender, No Biopsy scars, no Dimpling - Left, No Inflammation, No Lumpectomy scars, No Mastectomy scars, No Peau d' Orange. Breast - Right-Symmetric, Non Tender, No Biopsy scars, no Dimpling - Right, No Inflammation, No Lumpectomy scars, No Mastectomy scars, No Peau d' Orange. Breast Lump-No Palpable Breast Mass.  Cardiovascular Cardiovascular examination reveals -normal heart sounds, regular rate and rhythm with no murmurs and normal pedal pulses bilaterally.  Neurologic Neurologic evaluation reveals -alert and oriented x 3 with no impairment of recent or remote memory. Mental Status-Normal.  Lymphatic Head & Neck  General Head & Neck Lymphatics: Bilateral - Description - Normal. Axillary  General Axillary Region: Bilateral - Description - Normal. Tenderness - Non Tender.    Assessment & Plan (Malaki Koury A. Orrie Schubert MD; 10/04/2018 3:51 PM)  BREAST CANCER, RIGHT (C50.911) Impression: Referral for medical oncology consultation, radiation oncology consultation, genetics consultation. Discussed breast conserving surgery mastectomy and reconstruction. Discussed the role of each the pros and cons  of each as well as long-term expectations, recurrence rates and the need further procedures. Discussed potential Port-A-Cath placement she qualifies for chemotherapy as well. She would like to proceed with right breast lumpectomy with right sentinel lymph node mapping as long as her genetics are negative. Risk of lumpectomy include bleeding, infection, seroma, more surgery, use of seed/wire, wound care, cosmetic deformity and the need for other treatments, death , blood clots, death. Pt agrees to proceed. Risk of sentinel lymph node mapping include bleeding, infection, lymphedema, shoulder pain. stiffness, dye  allergy. cosmetic deformity , blood clots, death, need for more surgery. Pt agres to proceed. Pt requires port placement for chemotherapy. Risk include bleeding, infection, pneumothorax, hemothorax, mediastinal injury, nerve injury , blood vessel injury, strke, blood clots, death, migration. embolization and need for additional procedures. Pt agrees to proceed.  Current Plans Pt Education - [facility] Free Text Education/Instructions: discussed with patient and provided information. Pt Education - [facility] Breast Cancer Information Given - [facility] "Breast Journey" Package You are being scheduled for surgery- Our schedulers will call you.  You should hear from our office's scheduling department within 5 working days about the location, date, and time of surgery. We try to make accommodations for patient's preferences in scheduling surgery, but sometimes the OR schedule or the surgeon's schedule prevents [place] from making those accommodations.  If you have not heard from our office [phone]) in 5 working days, call the office and ask for your surgeon's nurse.  If you have other questions about your diagnosis, plan, or surgery, call the office and ask for your surgeon's nurse.  We discussed the staging and pathophysiology of breast cancer. We discussed all of the different options for treatment for breast  cancer including surgery, chemotherapy, radiation therapy, Herceptin, and antiestrogen therapy. We discussed a sentinel lymph node biopsy as she does not appear to having lymph node involvement right now. We discussed the performance of that with injection of radioactive tracer and blue dye. We discussed that she would have an incision underneath her axillary hairline. We discussed that there is a bout a 10-20% chance of having a positive node with a sentinel lymph node biopsy and we will await the permanent pathology to make any other first further decisions in terms of her treatment. One of these options might be to return to the operating room to perform an axillary lymph node dissection. We discussed about a 1-2% risk lifetime of chronic shoulder pain as well as lymphedema associated with a sentinel lymph node biopsy. We discussed the options for treatment of the breast cancer which included lumpectomy versus a mastectomy. We discussed the performance of the lumpectomy with a wire placement. We discussed a 10-20% chance of a positive margin requiring reexcision in the operating room. We also discussed that she may need radiation therapy or antiestrogen therapy or both if she undergoes lumpectomy. We discussed the mastectomy and the postoperative care for that as well. We discussed that there is no difference in her survival whether she undergoes lumpectomy with radiation therapy or antiestrogen therapy versus a mastectomy. There is a slight difference in the local recurrence rate being 3-5% with lumpectomy and about 1% with a mastectomy. We discussed the risks of operation including bleeding, infection, possible reoperation. She understands her further therapy will be based on what her stages at the time of her operation.  Pt Education - Pamphlet Given - Breast Biopsy: discussed with patient and provided information. Pt Education - [facility]b breast cancer surgery: discussed with patient and provided  information. Pt Education - [facility] Breast Biopsy HCI: discussed with patient and provided information.

## 2018-10-04 NOTE — H&P (Signed)
[name] Documented: 10/04/2018 2:33 PM Location: [facility] Patient #: [id number] DOB: 03-14-1976 Married / Language: [name] / Race: White Female  History of Present Illness Marcello Moores A. Kaylean Tupou MD; 10/04/2018 3:49 PM) Patient words: Patient sent at the request of [name] M.D. for abnormal screening mammogram. She underwent screening mammography which showed an 8 mm lesion right breast lower outer quadrant. 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 She has no family history of breast cancer other malignancies. She is otherwise in good health she states. The patient denies any history of breast pain, breast mass, nipple discharge or other problem with either breast.                  CLINICAL DATA: Screening recall for possible right breast mass.  EXAM: DIGITAL DIAGNOSTIC RIGHT MAMMOGRAM WITH CAD AND TOMO  ULTRASOUND RIGHT BREAST  COMPARISON: Previous exam(s).  ACR Breast Density Category c: The breast tissue is heterogeneously dense, which may obscure small masses.  FINDINGS: The possible mass noted in the posterior, inferior right breast persists on the spot compression imaging. It measures approximately 7 mm in size with partly irregular margins.  Mammographic images were processed with CAD.  On physical exam, no mass is palpated along the inferior right breast.  Targeted ultrasound is performed, showing a hypoechoic oval, vascular mass in the right breast at 6:30 o'clock, 15 cm the nipple, measuring 8 x 5 x 7 mm. There is a tiny adjacent mass, with a 3 mm, measuring 2 mm. Sonographic evaluation of the right axilla shows no enlarged or abnormal lymph nodes.  IMPRESSION: 1. 8 mm mass in the inferior right breast highly suspicious for breast carcinoma. Tissue sampling is warranted.  RECOMMENDATION: 1. Ultrasound-guided core needle biopsy of the 8 mm mass at the 6:30 o'clock position the right breast.  I  have discussed the findings and recommendations with the patient. Results were also provided in writing at the conclusion of the visit. If applicable, a reminder letter will be sent to the patient regarding the next appointment.  BI-RADS CATEGORY 5: Highly suggestive of malignancy.   Electronically Signed By: [name] M.D. On: 09/22/2018 14:16                     ADDITIONAL INFORMATION: PROGNOSTIC INDICATORS Results: IMMUNOHISTOCHEMICAL AND MORPHOMETRIC ANALYSIS PERFORMED MANUALLY The tumor cells are NEGATIVE for Her2 (0). Estrogen Receptor: 0%, NEGATIVE Progesterone Receptor: 0%, NEGATIVE Proliferation Marker Ki67: 90% COMMENT: The negative hormone receptor study(ies) in this case has An internal positive control. REFERENCE RANGE ESTROGEN RECEPTOR NEGATIVE 0% POSITIVE =>1% REFERENCE RANGE PROGESTERONE RECEPTOR NEGATIVE 0% POSITIVE =>1% All controls stained appropriately Enid Cutter MD Pathologist, Electronic Signature ( Signed 09/30/2018) FINAL DIAGNOSIS 1 of 2 FINAL for Anderson, Kaitlyn WALLER [phone]) Diagnosis Breast, right, needle core biopsy, lower outer 6:30 o'clock position - INVASIVE DUCTAL CARCINOMA. - DUCTAL CARCINOMA IN SITU. - SEE COMMENT. Microscopic Comment The carcinoma is grade III. A breast prognostic profile will be performed and the results reported separately. The results were called to the [place] on 09/28/2018. (JBK:kh 09/28/2018) Enid Cutter MD Pathologist, Electronic Signature (Case signed 09/28/2018) Specimen Gross and Clinical Information Specimen Comment In formalin 8:45, extracted < 65 min; 43 year old female with immediately adjacent 0.8 cm and 0.2 cm masses in LO right breast Specimen(s) Obtained: Breast, right, needle core biopsy, lower outer 6:30 o'clock position Gross Received labeled as "Anderson,Kaitlyn" and "Rt breast 6:30  LO breast" (TIF 0845 CIT <1 min) are 4 cores of yellow  to gray white soft to firm tissue, ranging from 0.9 x 0.15 x 0.15 cm to 1.4 x 0.15 x 0.15 cm. One block submitted. (SSW 6/29) Stain(s) used in Diagnosis: The following stain(s) were used in diagnosing the case: ER-ACIS, PR-ACIS, Her2 by IHC, KI-67-ACIS. The control(s) stained appropriately. Disclaimer Estrogen receptor (6F11), immunohistochemical stains are performed on formalin fixed, paraffin embedded tissue using a 3,3"-diaminobenzidine (DAB) chromogen and Leica Bond Autostainer System. The staining intensity of the nucleus is scored manually and is reported as the percentage of tumor cell nuclei demonstrating specific nuclear staining.Specimens are fixed in 10% Neutral Buffered Formalin for at least 6 hours and up to 72 hours. These tests have not be validated on decalcified tissue. Results should be interpreted with caution given the possibility of false negative results on decalcified specimens. IHC scores are reported using ASCO/CAP scoring criteria. An IHC Score of 0 or 1+ is NEGATIVE for HER2, 3+ is POSITIVE for HER2, and 2+ is EQUIVOCAL. Equivocal results are reflexed to either FISH or IHC testing. Specimens are fixed in 10% Neutral Buffered Formalin for at least 6 hours and up to 72 hours. These tests have not be validated on decalcified tissue. Results should be interpreted with caution given the possibility of false negative results on decalcified specimens. Ki-67 (MM1), immunohistochemical stains are performed on formalin fixed, paraffin embedded tissue using a 3,3"-diaminobenzidine (DAB) chromogen and Leica Bond Autostainer System. The staining intensity of the nucleus is scored manually and is reported as the percentage of tumor cell nuclei demonstrating specific nuclear staining.Specimens are fixed in 10% Neutral Buffered Formalin for at least 6 hours and up to 72 hours. These tests have not be validated on decalcified tissue. Results should be interpreted with caution given the  possibility of false negative results on decalcified specimens. PR progesterone receptor (16), immunohistochemical stains are performed on formalin fixed, paraffin embedded tissue using a 3,3"-diaminobenzidine (DAB) chromogen and Leica Bond Autostainer System. The staining intensity of the nucleus is scored manually and is reported as the percentage of tumor cell nuclei demonstrating specific nuclear staining.Specimens are fixed in 10% Neutral Buffered Formalin for at least 6 hours and up to 72 hours. These tests have.  The patient is a 43 year old female.   Past Surgical History Emeline Gins, [place]; 10/04/2018 2:33 PM) Appendectomy Breast Biopsy Right. Cesarean Section - 1 Oral Surgery Tonsillectomy  Diagnostic Studies History Emeline Gins, [place]; 10/04/2018 2:33 PM) Colonoscopy never Mammogram within last year Pap Smear 1-5 years ago  Allergies Emeline Gins, [place]; 10/04/2018 2:34 PM) No Known Drug Allergies [10/04/2018]: Allergies Reconciled  Medication History Emeline Gins, CMA; 10/04/2018 2:37 PM) Topiramate (100MG Tablet, Oral) Active. Rizatriptan Benzoate (10MG Tablet, Oral) Active. SUMAtriptan Succinate (100MG Tablet, Oral) Active. Sertraline HCl (100MG Tablet, Oral) Active. NuvaRing (0.12-0.015MG/24HR Ring, Vaginal) Active. Aimovig (70MG/ML Soln Auto-inj, Subcutaneous) Active. Protonix (40MG Packet, Oral) Active. Phenergan (12.5MG Tablet, Oral) Active. traMADol HCl (50MG Tablet, Oral) Active. Medications Reconciled  Social History Emeline Gins, [place]; 10/04/2018 2:33 PM) Alcohol use Occasional alcohol use. Caffeine use Carbonated beverages, Coffee, Tea. Tobacco use Former smoker.  Family History Emeline Gins, [place]; 10/04/2018 2:33 PM) Arthritis Mother. Depression Mother, Sister. Hypertension Father. Migraine Headache Mother, Tora Duck, Pandora Leiter.  Pregnancy / Birth History Emeline Gins, [place]; 10/04/2018 2:33 PM) Age at menarche 81  years. Contraceptive History Contraceptive implant. Gravida 2 Irregular periods Maternal age 5-30 Para 1  Other Problems Emeline Gins, [place]; 10/04/2018 2:33 PM)  Anxiety Disorder Gastroesophageal Reflux Disease Migraine Headache     Review of Systems Emeline Gins CMA; 10/04/2018 2:33 PM) General Not Present- Appetite Loss, Chills, Fatigue, Fever, Night Sweats, Weight Gain and Weight Loss. Skin Not Present- Change in Wart/Mole, Dryness, Hives, Jaundice, New Lesions, Non-Healing Wounds, Rash and Ulcer. HEENT Present- Wears glasses/contact lenses. Not Present- Earache, Hearing Loss, Hoarseness, Nose Bleed, Oral Ulcers, Ringing in the Ears, Seasonal Allergies, Sinus Pain, Sore Throat, Visual Disturbances and Yellow Eyes. Breast Present- Breast Mass. Not Present- Breast Pain, Nipple Discharge and Skin Changes. Cardiovascular Not Present- Chest Pain, Difficulty Breathing Lying Down, Leg Cramps, Palpitations, Rapid Heart Rate, Shortness of Breath and Swelling of Extremities. Gastrointestinal Not Present- Abdominal Pain, Bloating, Bloody Stool, Change in Bowel Habits, Chronic diarrhea, Constipation, Difficulty Swallowing, Excessive gas, Gets full quickly at meals, Hemorrhoids, Indigestion, Nausea, Rectal Pain and Vomiting. Female Genitourinary Not Present- Frequency, Nocturia, Painful Urination, Pelvic Pain and Urgency. Musculoskeletal Not Present- Back Pain, Joint Pain, Joint Stiffness, Muscle Pain, Muscle Weakness and Swelling of Extremities. Neurological Not Present- Decreased Memory, Fainting, Headaches, Numbness, Seizures, Tingling, Tremor, Trouble walking and Weakness. Psychiatric Not Present- Anxiety, Bipolar, Change in Sleep Pattern, Depression, Fearful and Frequent crying. Endocrine Not Present- Cold Intolerance, Excessive Hunger, Hair Changes, Heat Intolerance, Hot flashes and New Diabetes. Hematology Not Present- Blood Thinners, Easy Bruising, Excessive bleeding, Gland  problems, HIV and Persistent Infections.  Vitals Emeline Gins CMA; 10/04/2018 2:34 PM) 10/04/2018 2:34 PM Weight: 236 lb Height: 64in Body Surface Area: 2.1 m Body Mass Index: 40.51 kg/m  Temp.: 98.22F  Pulse: 112 (Regular)  BP: 140/78 (Sitting, Left Arm, Standard)        Physical Exam (Payeton Germani A. Edona Schreffler MD; 10/04/2018 3:50 PM)  General Mental Status-Alert. General Appearance-Consistent with stated age. Hydration-Well hydrated. Voice-Normal.  Head and Neck Head-normocephalic, atraumatic with no lesions or palpable masses. Trachea-midline. Thyroid Gland Characteristics - normal size and consistency.  Eye Eyeball - Bilateral-Extraocular movements intact. Sclera/Conjunctiva - Bilateral-No scleral icterus.  Breast Breast - Left-Symmetric, Non Tender, No Biopsy scars, no Dimpling - Left, No Inflammation, No Lumpectomy scars, No Mastectomy scars, No Peau d' Orange. Breast - Right-Symmetric, Non Tender, No Biopsy scars, no Dimpling - Right, No Inflammation, No Lumpectomy scars, No Mastectomy scars, No Peau d' Orange. Breast Lump-No Palpable Breast Mass.  Cardiovascular Cardiovascular examination reveals -normal heart sounds, regular rate and rhythm with no murmurs and normal pedal pulses bilaterally.  Neurologic Neurologic evaluation reveals -alert and oriented x 3 with no impairment of recent or remote memory. Mental Status-Normal.  Lymphatic Head & Neck  General Head & Neck Lymphatics: Bilateral - Description - Normal. Axillary  General Axillary Region: Bilateral - Description - Normal. Tenderness - Non Tender.    Assessment & Plan (Olive Motyka A. Iisha Soyars MD; 10/04/2018 3:51 PM)  BREAST CANCER, RIGHT (C50.911) Impression: Referral for medical oncology consultation, radiation oncology consultation, genetics consultation. Discussed breast conserving surgery mastectomy and reconstruction. Discussed the role of each the pros and cons  of each as well as long-term expectations, recurrence rates and the need further procedures. Discussed potential Port-A-Cath placement she qualifies for chemotherapy as well. She would like to proceed with right breast lumpectomy with right sentinel lymph node mapping as long as her genetics are negative. Risk of lumpectomy include bleeding, infection, seroma, more surgery, use of seed/wire, wound care, cosmetic deformity and the need for other treatments, death , blood clots, death. Pt agrees to proceed. Risk of sentinel lymph node mapping include bleeding, infection, lymphedema, shoulder pain. stiffness, dye  allergy. cosmetic deformity , blood clots, death, need for more surgery. Pt agres to proceed. Pt requires port placement for chemotherapy. Risk include bleeding, infection, pneumothorax, hemothorax, mediastinal injury, nerve injury , blood vessel injury, strke, blood clots, death, migration. embolization and need for additional procedures. Pt agrees to proceed.  Current Plans Pt Education - [facility] Free Text Education/Instructions: discussed with patient and provided information. Pt Education - [facility] Breast Cancer Information Given - [facility] "Breast Journey" Package You are being scheduled for surgery- Our schedulers will call you.  You should hear from our office's scheduling department within 5 working days about the location, date, and time of surgery. We try to make accommodations for patient's preferences in scheduling surgery, but sometimes the OR schedule or the surgeon's schedule prevents [place] from making those accommodations.  If you have not heard from our office [phone]) in 5 working days, call the office and ask for your surgeon's nurse.  If you have other questions about your diagnosis, plan, or surgery, call the office and ask for your surgeon's nurse.  We discussed the staging and pathophysiology of breast cancer. We discussed all of the different options for treatment for breast  cancer including surgery, chemotherapy, radiation therapy, Herceptin, and antiestrogen therapy. We discussed a sentinel lymph node biopsy as she does not appear to having lymph node involvement right now. We discussed the performance of that with injection of radioactive tracer and blue dye. We discussed that she would have an incision underneath her axillary hairline. We discussed that there is a bout a 10-20% chance of having a positive node with a sentinel lymph node biopsy and we will await the permanent pathology to make any other first further decisions in terms of her treatment. One of these options might be to return to the operating room to perform an axillary lymph node dissection. We discussed about a 1-2% risk lifetime of chronic shoulder pain as well as lymphedema associated with a sentinel lymph node biopsy. We discussed the options for treatment of the breast cancer which included lumpectomy versus a mastectomy. We discussed the performance of the lumpectomy with a wire placement. We discussed a 10-20% chance of a positive margin requiring reexcision in the operating room. We also discussed that she may need radiation therapy or antiestrogen therapy or both if she undergoes lumpectomy. We discussed the mastectomy and the postoperative care for that as well. We discussed that there is no difference in her survival whether she undergoes lumpectomy with radiation therapy or antiestrogen therapy versus a mastectomy. There is a slight difference in the local recurrence rate being 3-5% with lumpectomy and about 1% with a mastectomy. We discussed the risks of operation including bleeding, infection, possible reoperation. She understands her further therapy will be based on what her stages at the time of her operation.  Pt Education - Pamphlet Given - Breast Biopsy: discussed with patient and provided information. Pt Education - flb breast cancer surgery: discussed with patient and provided  information. Pt Education - [facility] Breast Biopsy HCI: discussed with patient and provided information.

## 2018-10-05 ENCOUNTER — Other Ambulatory Visit: Payer: 59

## 2018-10-05 ENCOUNTER — Telehealth: Payer: Self-pay | Admitting: Radiation Oncology

## 2018-10-05 NOTE — Telephone Encounter (Signed)
New message: ° ° °LVM for patient to return call to schedule from referral received. °

## 2018-10-06 ENCOUNTER — Encounter: Payer: Self-pay | Admitting: Adult Health

## 2018-10-06 DIAGNOSIS — C50511 Malignant neoplasm of lower-outer quadrant of right female breast: Secondary | ICD-10-CM | POA: Insufficient documentation

## 2018-10-06 DIAGNOSIS — Z171 Estrogen receptor negative status [ER-]: Secondary | ICD-10-CM | POA: Insufficient documentation

## 2018-10-07 ENCOUNTER — Encounter: Payer: Self-pay | Admitting: Genetic Counselor

## 2018-10-07 ENCOUNTER — Telehealth: Payer: Self-pay | Admitting: *Deleted

## 2018-10-07 ENCOUNTER — Inpatient Hospital Stay: Payer: 59 | Attending: Genetic Counselor | Admitting: Genetic Counselor

## 2018-10-07 ENCOUNTER — Inpatient Hospital Stay: Payer: 59

## 2018-10-07 ENCOUNTER — Other Ambulatory Visit: Payer: Self-pay | Admitting: Surgery

## 2018-10-07 ENCOUNTER — Ambulatory Visit: Payer: Self-pay | Admitting: Surgery

## 2018-10-07 ENCOUNTER — Other Ambulatory Visit: Payer: Self-pay

## 2018-10-07 DIAGNOSIS — Z793 Long term (current) use of hormonal contraceptives: Secondary | ICD-10-CM | POA: Insufficient documentation

## 2018-10-07 DIAGNOSIS — Z8042 Family history of malignant neoplasm of prostate: Secondary | ICD-10-CM | POA: Insufficient documentation

## 2018-10-07 DIAGNOSIS — Z808 Family history of malignant neoplasm of other organs or systems: Secondary | ICD-10-CM | POA: Insufficient documentation

## 2018-10-07 DIAGNOSIS — F329 Major depressive disorder, single episode, unspecified: Secondary | ICD-10-CM | POA: Insufficient documentation

## 2018-10-07 DIAGNOSIS — Z79899 Other long term (current) drug therapy: Secondary | ICD-10-CM | POA: Insufficient documentation

## 2018-10-07 DIAGNOSIS — Z8049 Family history of malignant neoplasm of other genital organs: Secondary | ICD-10-CM | POA: Diagnosis not present

## 2018-10-07 DIAGNOSIS — Z171 Estrogen receptor negative status [ER-]: Secondary | ICD-10-CM

## 2018-10-07 DIAGNOSIS — C50911 Malignant neoplasm of unspecified site of right female breast: Secondary | ICD-10-CM

## 2018-10-07 DIAGNOSIS — C50511 Malignant neoplasm of lower-outer quadrant of right female breast: Secondary | ICD-10-CM | POA: Insufficient documentation

## 2018-10-07 NOTE — Progress Notes (Signed)
REFERRING PROVIDER: Erroll Luna, MD [address] [place],   [address]  PRIMARY PROVIDER:  Loyola Mast, PA-C  PRIMARY REASON FOR VISIT:  1. Malignant neoplasm of lower-outer quadrant of right breast of female, estrogen receptor negative ([place])   2. Family history of melanoma   3. Family history of prostate cancer   4. Family history of cervical cancer      HISTORY OF PRESENT ILLNESS:   Ms. Akopyan, a 43 y.o. female, was seen for a [place] cancer genetics consultation at the request of Dr. Brantley Stage due to a personal history of breast cancer.  Ms. Capell presents to clinic today to discuss the possibility of a hereditary predisposition to cancer, genetic testing, and to further clarify her future cancer risks, as well as potential cancer risks for family members.   In 2020, at the age of 43, Ms. Bettes was diagnosed with DCIS and IDS, ER-/PR-/Her2-, of the right breast. The treatment plan includes surgery.   CANCER HISTORY:  Oncology History  Malignant neoplasm of lower-outer quadrant of right breast of female, estrogen receptor negative ([place])  09/27/2018 Cancer Staging   Staging form: Breast, AJCC 8th Edition - Clinical stage from 09/27/2018: Stage IB (cT1b, cN0, cM0, G3, ER-, PR-, HER2-) - Signed by Gardenia Phlegm, NP on 10/06/2018   10/06/2018 Initial Diagnosis   Malignant neoplasm of lower-outer quadrant of right breast of female, estrogen receptor negative ([place])      RISK FACTORS:  Menarche was at age 36.  First live birth at age 35.  OCP use for approximately 10 years.  Ovaries intact: yes.  Hysterectomy: yes.  Menopausal status: premenopausal.  HRT use: 0 years. Mammogram within the last year: yes. Number of breast biopsies: 1.  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    Employer: UNEMPLOYED  Social Designer, fashion/clothing strain: Not on file   Food insecurity    Worry: Not on file    Inability: Not on file   Transportation needs    Medical: Not on file    Non-medical: Not on file  Tobacco Use   Smoking status: Never Smoker  Substance and Sexual Activity   Alcohol use: Yes   Drug use: No   Sexual activity: Yes  Lifestyle   Physical activity    Days per week: Not on file    Minutes per session: Not on file   Stress: Not on file  Relationships   Social connections    Talks on phone: Not on file    Gets together: Not on file    Attends religious service: Not on file    Active member of club or organization: Not on file    Attends meetings of clubs or organizations: Not on file  Relationship status: Not on file  Other Topics Concern   Not on file  Social History Narrative   Not on file     FAMILY HISTORY:  We obtained a detailed, 4-generation family history.  Significant diagnoses are listed below: Family History  Problem Relation Age of Onset   Hypertension Father    Hypertension Mother    Heart failure Maternal Grandmother    Emphysema Maternal Grandmother    Diabetes Maternal Grandfather    Parkinson's disease Paternal Grandmother    Heart disease Paternal Grandfather    Prostate cancer Paternal Grandfather 75       not metastatic   Melanoma Maternal Aunt 58       sun exposure   Cervical cancer Maternal Great-grandmother    Breast cancer Neg Hx    Ms. Preisler has a maternal aunt who had melanoma in  her late 62s who frequently sunbathed prior to her diagnosis. She also has a maternal great-grandmother who had cervical cancer at an unknown age. There are no other known cancer diagnoses on her maternal side.  Ms. Rumler had a paternal grandfather who had prostate cancer at age 52. His prostate cancer was not metastatic. There are no other known cancer diagnoses on her paternal side.  Ms. Schlottman is unaware of previous family history of genetic testing for hereditary cancer risks. Ms. Everly maternal ancestors are of European descent, and paternal ancestors are of [place] and Native American descent. There is no reported Ashkenazi Jewish ancestry. There is no known consanguinity.    GENETIC COUNSELING ASSESSMENT: Ms. Teehan is a 43 y.o. female with a personal history of breast cancer which is somewhat suggestive of a hereditary cancer syndrome and predisposition to cancer. We, therefore, discussed and recommended the following at today's visit.   DISCUSSION: We discussed that 5 - 10% of breast cancer is hereditary, with most cases associated with BRCA1/2.  There are other genes that can be associated with hereditary cancer syndromes.  These include ATM, CHEK2, PALB2, etc.  We discussed that testing is beneficial for several reasons including surgical decision-making for breast cancer, knowing how to follow individuals after completing their treatment, and to understand if other family members could be at risk for cancer and allow them to undergo genetic testing.   We reviewed the characteristics, features and inheritance patterns of hereditary cancer syndromes. We also discussed genetic testing, including the appropriate family members to test, the process of testing, insurance coverage and turn-around-time for results. We discussed the implications of a negative, positive and/or variant of uncertain significant result. In order to get genetic test results in a timely manner so that Ms. Fehrenbach can use  these genetic test results for surgical decisions, we recommended Ms. Breithaupt pursue genetic testing for the [facility] Breast Cancer STAT gene panel. Once complete, we recommend Ms. Moncada pursue reflex genetic testing to the [facility] Common Hereditary Cancers gene panel.   Based on Ms. Blue's personal history of cancer, she meets medical criteria for genetic testing. Despite that she meets criteria, she may still have an out of pocket cost.   PLAN: After considering the risks, benefits, and limitations, Ms. Almendarez provided informed consent to pursue genetic testing and the blood sample was sent to [facility] for analysis of the Breast Cancer STAT gene panel + Common Hereditary Cancers gene panel. Results should be available within approximately one weeks' time, at which point they will be disclosed by telephone to Ms. Willison, as will any additional recommendations warranted by  these results. Ms. Corona will receive a summary of her genetic counseling visit and a copy of her results once available. This information will also be available in Epic.   Ms. Karel questions were answered to her satisfaction today. Our contact information was provided should additional questions or concerns arise. Thank you for the referral and allowing [place] to share in the care of your patient.   Clint Guy, MS Genetic Counselor [email or web address] Phone: [phone]   The patient was seen for a total of 30 minutes in face-to-face genetic counseling.  This patient was discussed with Drs. Magrinat, Lindi Adie and/or Burr Medico who agrees with the above.   _______________________________________________________________________ For Office Staff:  Number of people involved in session: 1 Was an Intern/ student involved with case: no

## 2018-10-07 NOTE — Telephone Encounter (Signed)
Received call from patient inquiring about her referral status and new appt with oncologist. She has an appt with Dr. Lisbeth Renshaw in [place] on 10/12/18.   Will she be seen by one of the oncologists here as well?  Please advise patient

## 2018-10-07 NOTE — Progress Notes (Signed)
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  Mammogram: 8 mm mass in the inferior right breast highly suspicious for breast carcinoma.  Sonographic evaluation of the right axilla shows no enlarged or abnormal lymph nodes.  Histology per Pathology Report: Right Breast 09/27/2018  Receptor Status: ER(0% -), PR (0% -), Her2-neu (-), Ki-67(90%)   Past/Anticipated interventions by surgeon, if any: Dr. Brantley Stage 10/04/2018 -Discussed breast conserving surgery, mastectomy, and reconstruction.  Discussed the potential Port-A-Cath placement she qualifies for chemotherapy as well. -She would like to proceed with right breast lumpectomy with sentinel lymph node mapping as long as her genetics are negative. -referral to medical oncology, radiation oncology and genetics. -Right Breast lumpectomy with radioactive seed and right sentinel lymph node mapping 11/03/2018.  Past/Anticipated interventions by medical oncology, if any: Chemotherapy  Dr. Lindi Adie 10/11/2018 Recommendations: 1. Breast conserving surgery followed by 2.  Adjuvant chemotherapy with dose dense Adriamycin and Cytoxan followed by Taxol weekly x12 (the final tumor is 6 mm or greater) 3. Adjuvant radiation therapy followed by 4. Adjuvant antiestrogen therapy  Lymphedema issues, if any:  No  Pain issues, if any: No  SAFETY ISSUES:  Prior radiation? No  Pacemaker/ICD? No  Possible current pregnancy? No  Is the patient on methotrexate? No  Current Complaints / other details:   -Genetic testing  10/07/2018    Cori Razor, RN 10/07/2018,2:04 PM

## 2018-10-09 NOTE — Progress Notes (Signed)
[place] CONSULT NOTE  Patient Care Team: Annye English as PCP - General (Physician Assistant)  CHIEF COMPLAINTS/PURPOSE OF CONSULTATION:  Newly diagnosed breast cancer  HISTORY OF PRESENTING ILLNESS:  [name] 43 y.o. female is here because of recent diagnosis of invasive ductal carcinoma of the right breast. The cancer was detected on a routine screening mammogram. Diagnostic mammogram on 09/22/18 showed an 63m mass and adjacent 337mmass in the right breast suspicious for malignancy with no axillary adenopathy. 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 She presents to the clinic today for initial evaluation.   I reviewed her records extensively and collaborated the history with the patient.  SUMMARY OF ONCOLOGIC HISTORY: Oncology History  Malignant neoplasm of lower-outer quadrant of right breast of female, estrogen receptor negative [place] 09[place] Cancer Staging   Staging form: Breast, AJCC 8th Edition - Clinical stage from 09/27/2018: Stage IB (cT1b, cN0, cM0, G3, ER-, PR-, HER2-) - Signed by CaGardenia PhlegmNP on 10/06/2018   10/06/2018 Initial Diagnosis   Screening mammogram detected right breast mass. Diagnostic mammogram showed 50m15mass with adjacent 3mm62mss in the right breast with no axillary adenopathy. 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       MEDICAL HISTORY:  Past Medical History:  Diagnosis Date  . Allergy   . Depression   . Family history of cervical cancer   . Family history of melanoma   . Family history of prostate cancer   . GERD (gastroesophageal reflux disease)   . Migraine     SURGICAL HISTORY: Past Surgical History:  Procedure Laterality Date  . ADENOIDECTOMY    . APPENDECTOMY    . CESAREAN SECTION      SOCIAL HISTORY: Social History   Socioeconomic History  . Marital status:  Married    Spouse name: Not on file  . Number of children: Not on file  . Years of education: Not on file  . Highest education level: Not on file  Occupational History  . 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 Financial resource strain: Not on file  . Food insecurity    Worry: Not on file    Inability: Not on file  . Transportation needs    Medical: Not on file    Non-medical: Not on file  Tobacco Use  . Smoking status: Never Smoker  Substance and Sexual Activity  . Alcohol use: Yes  . Drug use: No  . Sexual activity: Yes  Lifestyle  . Physical activity    Days per week: Not on file    Minutes per session: Not on file  . Stress: Not on file  Relationships  . Social connHerbalistphone: Not on file    Gets together: Not on file    Attends religious service: Not on file    Active member of club or organization: Not on file    Attends meetings of clubs or organizations: Not on file    Relationship status: Not on file  . Intimate partner violence    Fear of current or ex partner: Not on file    Emotionally abused: Not on file    Physically abused: Not on file    Forced sexual activity: Not on file  Other Topics Concern  . Not on file  Social History  Narrative  . Not on file    FAMILY HISTORY: Family History  Problem Relation Age of Onset  . Hypertension Father   . Hypertension Mother   . Heart failure Maternal Grandmother   . Emphysema Maternal Grandmother   . Diabetes Maternal Grandfather   . Parkinson's disease Paternal Grandmother   . Heart disease Paternal Grandfather   . Prostate cancer Paternal Grandfather 63       not metastatic  . Melanoma Maternal Aunt 58       sun exposure  . Cervical cancer Maternal Great-grandmother   . Breast cancer Neg Hx     ALLERGIES:  has No Known Allergies.  MEDICATIONS:  Current Outpatient Medications  Medication Sig Dispense Refill  . antipyrine-benzocaine (AURALGAN) otic  solution Place 3 drops into the right ear every 2 (two) hours as needed for pain. 10 mL 0  . cefUROXime (CEFTIN) 500 MG tablet Take 1 tablet (500 mg total) by mouth 2 (two) times daily. 14 tablet 0  . cyanocobalamin 100 MCG tablet Take 100 mcg by mouth daily.    Marland Kitchen HYDROcodone-acetaminophen (NORCO/VICODIN) 5-325 MG per tablet Take 1 tablet by mouth every 6 (six) hours as needed for pain. 15 tablet 0  . Multiple Vitamin (MULTIVITAMIN) tablet Take 1 tablet by mouth daily.    . naproxen (NAPROSYN) 500 MG tablet Take 1 tablet (500 mg total) by mouth 2 (two) times daily. 30 tablet 0  . neomycin-polymyxin-hydrocortisone (CORTISPORIN) 3.5-10000-1 otic suspension Place 4 drops into the left ear 4 (four) times daily. 10 mL 0  . NUVARING 0.12-0.015 MG/24HR vaginal ring Place 1 each vaginally every 30 (thirty) days.    . pantoprazole (PROTONIX) 40 MG tablet Take 1 tablet (40 mg total) by mouth daily. 90 tablet 1  . promethazine (PHENERGAN) 12.5 MG tablet Take 12.5 mg by mouth every 6 (six) hours as needed for nausea.    . rizatriptan (MAXALT) 10 MG tablet Take 10 mg by mouth as needed for migraine. May repeat in 2 hours if needed    . sertraline (ZOLOFT) 100 MG tablet Take 1.5 tablets (150 mg total) by mouth daily. PATIENT NEEDS OFFICE VISIT FOR ADDITIONAL REFILLS 45 tablet 0  . SUMAtriptan (IMITREX) 100 MG tablet Take 100 mg by mouth every 2 (two) hours as needed for migraine.     . topiramate (TOPAMAX) 100 MG tablet Take 100 mg by mouth daily.    Marland Kitchen zolpidem (AMBIEN) 5 MG tablet Take 5 mg by mouth at bedtime as needed for sleep.      No current facility-administered medications for this visit.     REVIEW OF SYSTEMS:   Constitutional: Denies fevers, chills or abnormal night sweats Eyes: Denies blurriness of vision, double vision or watery eyes Ears, nose, mouth, throat, and face: Denies mucositis or sore throat Respiratory: Denies cough, dyspnea or wheezes Cardiovascular: Denies palpitation, chest  discomfort or lower extremity swelling Gastrointestinal:  Denies nausea, heartburn or change in bowel habits Skin: Denies abnormal skin rashes Lymphatics: Denies new lymphadenopathy or easy bruising Neurological:Denies numbness, tingling or new weaknesses Behavioral/Psych: Mood is stable, no new changes  Breast: Denies any palpable lumps or discharge All other systems were reviewed with the patient and are negative.  PHYSICAL EXAMINATION: ECOG PERFORMANCE STATUS: 1 - Symptomatic but completely ambulatory  Vitals:   10/11/18 1531  BP: (!) 142/90  Pulse: 97  Resp: 18  Temp: 98.7 F (37.1 C)  SpO2: 99%   Filed Weights   10/11/18 1531  Weight: 239  lb 12.8 oz (108.8 kg)    Physical exam not done due to COVID-19 precautions LABORATORY DATA:  I have reviewed the data as listed No results found for: WBC, HGB, HCT, MCV, PLT No results found for: NA, K, CL, CO2  RADIOGRAPHIC STUDIES: I have personally reviewed the radiological reports and agreed with the findings in the report.  ASSESSMENT AND PLAN:  Malignant neoplasm of lower-outer quadrant of right breast of female, estrogen receptor negative ([place]) 10/06/2018:Screening mammogram detected right breast mass. Diagnostic mammogram showed 25m mass with adjacent 361mmass in the right breast with no axillary adenopathy. 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   Pathology and radiology counseling:Discussed with the patient, the details of pathology including the type of breast cancer,the clinical staging, the significance of ER, PR and HER-2/neu receptors and the implications for treatment. After reviewing the pathology in detail, we proceeded to discuss the different treatment options between surgery, radiation, chemotherapy, antiestrogen therapies.  Recommendations: 1. Breast conserving surgery followed by 2.  Adjuvant chemotherapy with dose dense Adriamycin and Cytoxan followed by Taxol  weekly x12 (the final tumor is 6 mm or greater) 3. Adjuvant radiation therapy followed by 4. Adjuvant antiestrogen therapy  Chemotherapy Counseling: I discussed the risks and benefits of chemotherapy including the risks of nausea/ vomiting, risk of infection from low WBC count, fatigue due to chemo or anemia, bruising or bleeding due to low platelets, mouth sores, loss/ change in taste and decreased appetite. Liver and kidney function will be monitored through out chemotherapy as abnormalities in liver and kidney function may be a side effect of treatment. Cardiac dysfunction due to Adriamycin was discussed in detail. Risk of permanent bone marrow dysfunction and leukemia due to chemo were also discussed.  Risk of neuropathy from Taxol was also discussed.  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   All questions were answered. The patient knows to call the clinic with any problems, questions or concerns.   ViRulon EisenmengerMD 10/11/2018    I, Molly Dorshimer, am acting as scribe for ViNicholas LoseMD.  I have reviewed the above documentation for accuracy and completeness, and I agree with the above.

## 2018-10-11 ENCOUNTER — Inpatient Hospital Stay (HOSPITAL_BASED_OUTPATIENT_CLINIC_OR_DEPARTMENT_OTHER): Payer: 59 | Admitting: Hematology and Oncology

## 2018-10-11 ENCOUNTER — Telehealth: Payer: Self-pay | Admitting: Radiation Oncology

## 2018-10-11 ENCOUNTER — Other Ambulatory Visit: Payer: Self-pay

## 2018-10-11 DIAGNOSIS — Z808 Family history of malignant neoplasm of other organs or systems: Secondary | ICD-10-CM

## 2018-10-11 DIAGNOSIS — Z793 Long term (current) use of hormonal contraceptives: Secondary | ICD-10-CM | POA: Diagnosis not present

## 2018-10-11 DIAGNOSIS — Z79899 Other long term (current) drug therapy: Secondary | ICD-10-CM

## 2018-10-11 DIAGNOSIS — Z171 Estrogen receptor negative status [ER-]: Secondary | ICD-10-CM | POA: Diagnosis not present

## 2018-10-11 DIAGNOSIS — Z8042 Family history of malignant neoplasm of prostate: Secondary | ICD-10-CM

## 2018-10-11 DIAGNOSIS — F329 Major depressive disorder, single episode, unspecified: Secondary | ICD-10-CM | POA: Diagnosis not present

## 2018-10-11 DIAGNOSIS — Z8049 Family history of malignant neoplasm of other genital organs: Secondary | ICD-10-CM | POA: Diagnosis not present

## 2018-10-11 DIAGNOSIS — C50511 Malignant neoplasm of lower-outer quadrant of right female breast: Secondary | ICD-10-CM

## 2018-10-11 MED ORDER — AIMOVIG 140 MG/ML ~~LOC~~ SOAJ: 140.0000 mg | SUBCUTANEOUS | Status: DC

## 2018-10-11 MED ORDER — SUMATRIPTAN SUCCINATE REFILL 6 MG/0.5ML ~~LOC~~ SOCT: 6.0000 mg | SUBCUTANEOUS | Status: DC | PRN

## 2018-10-11 NOTE — Assessment & Plan Note (Addendum)
10/06/2018:Screening mammogram detected right breast mass. Diagnostic mammogram showed 56m mass with adjacent 340mmass in the right breast with no axillary adenopathy. 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   Pathology and radiology counseling:Discussed with the patient, the details of pathology including the type of breast cancer,the clinical staging, the significance of ER, PR and HER-2/neu receptors and the implications for treatment. After reviewing the pathology in detail, we proceeded to discuss the different treatment options between surgery, radiation, chemotherapy, antiestrogen therapies.  Recommendations: 1. Breast conserving surgery followed by 2.  Adjuvant chemotherapy with dose dense Adriamycin and Cytoxan followed by Taxol weekly x12 3. Adjuvant radiation therapy followed by 4. Adjuvant antiestrogen therapy  Return to clinic after surgery to discuss final pathology report and then plan her chemo.

## 2018-10-12 ENCOUNTER — Other Ambulatory Visit: Payer: Self-pay

## 2018-10-12 ENCOUNTER — Telehealth: Payer: Self-pay | Admitting: Hematology and Oncology

## 2018-10-12 ENCOUNTER — Encounter: Payer: Self-pay | Admitting: Radiation Oncology

## 2018-10-12 ENCOUNTER — Ambulatory Visit
Admission: RE | Admit: 2018-10-12 | Discharge: 2018-10-12 | Disposition: A | Discharge status: Home / Self Care | Payer: 59 | Source: Ambulatory Visit | Attending: Radiation Oncology | Admitting: Radiation Oncology

## 2018-10-12 ENCOUNTER — Encounter: Payer: Self-pay | Admitting: *Deleted

## 2018-10-12 VITALS — Ht 63.5 in | Wt 239.0 lb

## 2018-10-12 DIAGNOSIS — Z171 Estrogen receptor negative status [ER-]: Secondary | ICD-10-CM

## 2018-10-12 DIAGNOSIS — C50511 Malignant neoplasm of lower-outer quadrant of right female breast: Secondary | ICD-10-CM

## 2018-10-12 NOTE — Telephone Encounter (Signed)
I left a message regarding 8/12

## 2018-10-12 NOTE — Progress Notes (Signed)
Radiation Oncology         ([phone]) [phone] ________________________________  Name: [name]        MRN: [id number]  Date of Service: 10/12/2018 DOB: 1975-06-02  CC:Vincent Gros, MD     REFERRING PHYSICIAN: Erroll Luna, MD   DIAGNOSIS: The encounter diagnosis was Malignant neoplasm of lower-outer quadrant of right breast of female, estrogen receptor negative ([place]).   HISTORY OF PRESENT ILLNESS: [name] is a 43 y.o. female with a new diagnosis of right breast cancer. The patient was noted to have a screening detected abnormality in the right breast. Diagnostic imaging revealed an 8 mm and a 3 mm lesion in the breast and her axilla was negative for adenopathy. 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 Her tumor was triple negative with a Ki 67 of 90%. She has met with genetic counseling, but even with these results would plan breast conservation. She is scheduled to undergo right lumpectomy with sentinel node biopsy on 11/03/2018. She is seen today on MyChart telemedicine to discuss options of adjuvant radiotherapy at the appropriate time.    PREVIOUS RADIATION THERAPY: No   PAST MEDICAL HISTORY:  Past Medical History:  Diagnosis Date  . Allergy   . Depression   . Family history of cervical cancer   . Family history of melanoma   . Family history of prostate cancer   . GERD (gastroesophageal reflux disease)   . Migraine        PAST SURGICAL HISTORY: Past Surgical History:  Procedure Laterality Date  . ADENOIDECTOMY    . APPENDECTOMY    . CESAREAN SECTION       FAMILY HISTORY:  Family History  Problem Relation Age of Onset  . Hypertension Father   . Hypertension Mother   . Heart failure Maternal Grandmother   . Emphysema Maternal Grandmother   . Diabetes Maternal Grandfather   . Parkinson's disease Paternal Grandmother   . Heart disease Paternal Grandfather   . Prostate cancer  Paternal Grandfather 6       not metastatic  . Melanoma Maternal Aunt 58       sun exposure  . Cervical cancer Maternal Great-grandmother   . Breast cancer Neg Hx      SOCIAL HISTORY:  reports that she has never smoked. She has never used smokeless tobacco. She reports current alcohol use. She reports that she does not use drugs. The patient is married and lives in [place]. She has three children. 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    ALLERGIES: Patient has no known allergies.   MEDICATIONS:  Current Outpatient Medications  Medication Sig Dispense Refill  . Erenumab-aooe (AIMOVIG) 140 MG/ML SOAJ Inject 140 mg into the skin every 30 (thirty) days. 1.12 mg   . Multiple Vitamin (MULTIVITAMIN) tablet Take 1 tablet by mouth daily.    Marland Kitchen NUVARING 0.12-0.015 MG/24HR vaginal ring Place 1 each vaginally every 30 (thirty) days.    . pantoprazole (PROTONIX) 40 MG tablet Take 1 tablet (40 mg total) by mouth daily. 90 tablet 1  . promethazine (PHENERGAN) 12.5 MG tablet Take 12.5 mg by mouth every 6 (six) hours as needed for nausea.    . rizatriptan (MAXALT) 10 MG tablet Take 10 mg by mouth as needed for migraine. May repeat in 2 hours if needed    . sertraline (ZOLOFT) 100 MG  tablet Take 1.5 tablets (150 mg total) by mouth daily. PATIENT NEEDS OFFICE VISIT FOR ADDITIONAL REFILLS 45 tablet 0  . SUMAtriptan Succinate Refill 6 MG/0.5ML SOCT Inject 6 mg into the skin as needed. 15 mL   . topiramate (TOPAMAX) 100 MG tablet Take 100 mg by mouth daily.    . traMADol (ULTRAM) 50 MG tablet tramadol 50 mg tablet  Take 1 tablet every 6 hours by oral route.    Marland Kitchen zolpidem (AMBIEN) 5 MG tablet Take 5 mg by mouth at bedtime as needed for sleep.      No current facility-administered medications for this encounter.      REVIEW OF SYSTEMS: On review of systems, the patient reports that she is doing well overall. She denies any chest pain, shortness of  breath, cough, fevers, chills, night sweats, unintended weight changes. She denies any bowel or bladder disturbances, and denies abdominal pain, nausea or vomiting. She denies any new musculoskeletal or joint aches or pains. A complete review of systems is obtained and is otherwise negative.     PHYSICAL EXAM:  Wt Readings from Last 3 Encounters:  10/12/18 239 lb (108.4 kg)  10/11/18 239 lb 12.8 oz (108.8 kg)  01/12/14 226 lb 6.4 oz (102.7 kg)   Temp Readings from Last 3 Encounters:  10/11/18 98.7 F (37.1 C) (Oral)  01/12/14 98.2 F (36.8 C) (Oral)  07/15/12 98.4 F (36.9 C) (Oral)   BP Readings from Last 3 Encounters:  10/11/18 (!) 142/90  01/12/14 132/74  07/15/12 119/75   Pulse Readings from Last 3 Encounters:  10/11/18 97  01/12/14 (!) 105  07/15/12 104    In general this is a well appearing caucasian female in no acute distress. She's alert and oriented x4 and appropriate throughout the examination. Cardiopulmonary assessment is negative for acute distress and she exhibits normal effort. Breast exam is deferred.   ECOG = 0  0 - Asymptomatic (Fully active, able to carry on all predisease activities without restriction)  1 - Symptomatic but completely ambulatory (Restricted in physically strenuous activity but ambulatory and able to carry out work of a light or sedentary nature. For example, light housework, office work)  2 - Symptomatic, <50% in bed during the day (Ambulatory and capable of all self care but unable to carry out any work activities. Up and about more than 50% of waking hours)  3 - Symptomatic, >50% in bed, but not bedbound (Capable of only limited self-care, confined to bed or chair 50% or more of waking hours)  4 - Bedbound (Completely disabled. Cannot carry on any self-care. Totally confined to bed or chair)  5 - Death   Eustace Pen MM, Creech RH, Tormey DC, et al. [phone]). "Toxicity and response criteria of the [facility]". [place] Oncol. 5 (6): 649-55    LABORATORY DATA:  No results found for: WBC, HGB, HCT, MCV, PLT No results found for: NA, K, CL, CO2 No results found for: ALT, AST, GGT, ALKPHOS, BILITOT    RADIOGRAPHY: US Breast Ltd Uni Right Inc Axilla  Result Date: 09/22/2018 CLINICAL DATA:  Screening recall for possible right breast mass. EXAM: DIGITAL DIAGNOSTIC RIGHT MAMMOGRAM WITH CAD AND TOMO ULTRASOUND RIGHT BREAST COMPARISON:  Previous exam(s). ACR Breast Density Category c: The breast tissue is heterogeneously dense, which may obscure small masses. FINDINGS: The possible mass noted in the posterior, inferior right breast persists on the spot compression imaging. It measures approximately 7 mm in size with partly irregular  margins. Mammographic images were processed with CAD. On physical exam, no mass is palpated along the inferior right breast. Targeted ultrasound is performed, showing a hypoechoic oval, vascular mass in the right breast at 6:30 o'clock, 15 cm the nipple, measuring 8 x 5 x 7 mm. There is a tiny adjacent mass, with a 3 mm, measuring 2 mm. Sonographic evaluation of the right axilla shows no enlarged or abnormal lymph nodes. IMPRESSION: 1. 8 mm mass in the inferior right breast highly suspicious for breast carcinoma. Tissue sampling is warranted. RECOMMENDATION: 1. Ultrasound-guided core needle biopsy of the 8 mm mass at the 6:30 o'clock position the right breast. I have discussed the findings and recommendations with the patient. Results were also provided in writing at the conclusion of the visit. If applicable, a reminder letter will be sent to the patient regarding the next appointment. BI-RADS CATEGORY  5: Highly suggestive of malignancy. Electronically Signed   By: Lajean Manes M.D.   On: 09/22/2018 14:16   Mm Diag Breast Tomo Uni Right  Result Date: 09/22/2018 CLINICAL DATA:  Screening recall for possible right breast mass. EXAM: DIGITAL DIAGNOSTIC RIGHT MAMMOGRAM WITH CAD AND TOMO  ULTRASOUND RIGHT BREAST COMPARISON:  Previous exam(s). ACR Breast Density Category c: The breast tissue is heterogeneously dense, which may obscure small masses. FINDINGS: The possible mass noted in the posterior, inferior right breast persists on the spot compression imaging. It measures approximately 7 mm in size with partly irregular margins. Mammographic images were processed with CAD. On physical exam, no mass is palpated along the inferior right breast. Targeted ultrasound is performed, showing a hypoechoic oval, vascular mass in the right breast at 6:30 o'clock, 15 cm the nipple, measuring 8 x 5 x 7 mm. There is a tiny adjacent mass, with a 3 mm, measuring 2 mm. Sonographic evaluation of the right axilla shows no enlarged or abnormal lymph nodes. IMPRESSION: 1. 8 mm mass in the inferior right breast highly suspicious for breast carcinoma. Tissue sampling is warranted. RECOMMENDATION: 1. Ultrasound-guided core needle biopsy of the 8 mm mass at the 6:30 o'clock position the right breast. I have discussed the findings and recommendations with the patient. Results were also provided in writing at the conclusion of the visit. If applicable, a reminder letter will be sent to the patient regarding the next appointment. BI-RADS CATEGORY  5: Highly suggestive of malignancy. Electronically Signed   By: Lajean Manes M.D.   On: 09/22/2018 14:16   Mm Clip Placement Right  Result Date: 09/27/2018 CLINICAL DATA:  Evaluate HEART clip placement following ultrasound-guided RIGHT breast biopsy. EXAM: DIAGNOSTIC RIGHT MAMMOGRAM POST ULTRASOUND BIOPSY COMPARISON:  Previous exam(s). FINDINGS: Mammographic images were obtained following ultrasound guided biopsy of the adjacent 0.8 cm and 0.2 cm masses at the 6:30 position of the RIGHT breast. The HEART clip is in satisfactory position. IMPRESSION: Satisfactory position of HEART biopsy clip following ultrasound-guided RIGHT breast biopsy. Final Assessment: Post Procedure  Mammograms for Marker Placement Electronically Signed   By: Margarette Canada M.D.   On: 09/27/2018 09:13   [place] Rt Breast Bx W Loc Dev 1st Lesion Img Bx Spec US Guide  Addendum Date: 09/28/2018   ADDENDUM REPORT: 09/28/2018 14:59 ADDENDUM: Pathology revealed GRADE III INVASIVE DUCTAL CARCINOMA, DUCTAL CARCINOMA IN SITU of the RIGHT breast, lower outer 6:30 o'clock position. This was found to be concordant by Dr. Hassan Rowan. Pathology results were discussed with the patient by telephone. The patient reported doing well after the biopsy with tenderness at  the site. Post biopsy instructions and care were reviewed and questions were answered. The patient was encouraged to call [facility] for any additional concerns. Surgical consultation has been arranged with Dr. Erroll Luna at [facility] on October 04, 2018. Pathology results reported by Terie Purser, RN on 09/28/2018. Electronically Signed   By: Margarette Canada M.D.   On: 09/28/2018 14:59   Result Date: 09/28/2018 CLINICAL DATA:  43 year old female for tissue sampling of immediately adjacent 0.8 cm and 0.2 cm masses within the LOWER OUTER RIGHT breast. EXAM: ULTRASOUND GUIDED RIGHT BREAST CORE NEEDLE BIOPSY COMPARISON:  Previous exam(s). FINDINGS: I met with the patient and we discussed the procedure of ultrasound-guided biopsy, including benefits and alternatives. We discussed the high likelihood of a successful procedure. We discussed the risks of the procedure, including infection, bleeding, tissue injury, clip migration, and inadequate sampling. Informed written consent was given. The usual time-out protocol was performed immediately prior to the procedure. Lesion quadrant: LOWER OUTER RIGHT breast Using sterile technique and 1% Lidocaine as local anesthetic, under direct ultrasound visualization, a 12 gauge spring-loaded device was used to perform biopsy of the adjacent 0.8 cm and 0.2 cm masses at the 6:30 position of the RIGHT  breast using a LATERAL approach. Given very close proximity, these 2 masses were biopsied as 1 entity. At the conclusion of the procedure a HEART tissue marker clip was deployed into the biopsy cavity between the 2 masses. Follow up 2 view mammogram was performed and dictated separately. IMPRESSION: Ultrasound guided biopsy of adjacent 0.8 cm and 0.2 cm masses within the LOWER OUTER RIGHT breast. The HEART biopsy clip was placed between the 2 masses. No apparent complications. Electronically Signed: By: Margarette Canada M.D. On: 09/27/2018 09:08       IMPRESSION/PLAN: 1. 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 Dr. Lisbeth Renshaw discusses the pathology findings and reviews the nature of triple negative breast disease. She is awaiting her genetic testing but reports that the results would not lead her to choose mastectomy. She would benefit from adjuvant radiotherapy following chemotherapy to reduce the risks of local recurrence.  We discussed the risks, benefits, short, and long term effects of radiotherapy, and the patient is interested in proceeding. Dr. Lisbeth Renshaw discusses the delivery and logistics of radiotherapy and anticipates a course of 6 1/2 weeks of radiotherapy. We will see her back about 2-3 weeks after completing chemotherapy and coordinate treatment planning at that time.  2. Contraception. The patient is currently using Nuvaring, but Dr. Lindi Adie anticipates other forms of contraception during chemotherapy. We will follow up with her decision making but even if she has nuvaring at the time of radiotherapy, we would proceed with urine pregnancy testing prior to simulation.   This encounter was provided by telemedicine platform MyChart.  The patient has given verbal consent for this type of encounter and has been advised to only accept a meeting of this type in a secure network environment. The time spent during this encounter was 45 minutes. The attendants for  this meeting include Blenda Nicely, RN, Dr. Lisbeth Renshaw, Hayden Pedro  and Massie Bougie. Mr. Bouvier also joined our conversation. During the encounter,  Blenda Nicely, RN, Dr. Lisbeth Renshaw, and Hayden Pedro were located at [facility].  Massie Bougie and Mr. Single were located at home.    The above documentation reflects my direct findings during this shared  patient visit. Please see the separate note by Dr. Lisbeth Renshaw on this date for the remainder of the patient's plan of care.    Carola Rhine, PAC

## 2018-10-13 ENCOUNTER — Telehealth: Payer: Self-pay | Admitting: Genetic Counselor

## 2018-10-13 DIAGNOSIS — Z1379 Encounter for other screening for genetic and chromosomal anomalies: Secondary | ICD-10-CM | POA: Insufficient documentation

## 2018-10-13 NOTE — Telephone Encounter (Signed)
Revealed negative genetic testing for the [facility] Breast Cancer STAT panel.  Discussed that we do not know why she has breast cancer or why there is cancer in the family. It could be due to a different gene that we are not testing, or maybe our current technology may not be able to pick something up.  Also advised [name] to call [employer] regarding the cost of her genetic testing.

## 2018-10-13 NOTE — Telephone Encounter (Signed)
Revealed genetic testing results for the [facility] Common Hereditary Cancers panel. 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 Since she has only one pathogenic mutation in MUTYH, she is NOT affected with autosomal recessive MUTYH-associated polyposis, but instead is a carrier. Because she has no family history of colon cancer, no specialized screening is warranted per NCCN guidelines (v3.2019). We also discussed how this information pertains to her son, who has a 50% chance to also be a carrier. Genetic testing also revealed a variant of uncertain significance in one of her NTHL1 genes called c.16G>A.

## 2018-10-14 ENCOUNTER — Ambulatory Visit: Payer: Self-pay | Admitting: Genetic Counselor

## 2018-10-14 ENCOUNTER — Encounter: Payer: Self-pay | Admitting: Genetic Counselor

## 2018-10-14 DIAGNOSIS — Z1379 Encounter for other screening for genetic and chromosomal anomalies: Secondary | ICD-10-CM

## 2018-10-14 NOTE — Progress Notes (Signed)
HPI:  Ms. Weseman was previously seen in the [place] clinic due to a personal history of breast cancer and concerns regarding a hereditary predisposition to cancer. Please refer to our prior cancer genetics clinic note for more information regarding our discussion, assessment and recommendations, at the time. Ms. Heward recent genetic test results were disclosed to her, as were recommendations warranted by these results. These results and recommendations are discussed in more detail below.  CANCER HISTORY:  Oncology History  Malignant neoplasm of lower-outer quadrant of right breast of female, estrogen receptor negative ([place])  09/27/2018 Cancer Staging   Staging form: Breast, AJCC 8th Edition - Clinical stage from 09/27/2018: Stage IB (cT1b, cN0, cM0, G3, ER-, PR-, HER2-) - Signed by Gardenia Phlegm, NP on 10/06/2018   10/06/2018 Initial Diagnosis   Screening mammogram detected right breast mass. Diagnostic mammogram showed 23m mass with adjacent 369mmass in the right breast with no axillary adenopathy. 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  The report date is 10/13/2018.  The Common Hereditary Gene Panel offered by [facility] includes sequencing and/or deletion duplication testing of the following 48 genes: APC, ATM, AXIN2, BARD1, BMPR1A, BRCA1, BRCA2, BRIP1, CDH1, CDK4, CDKN2A (p14ARF), CDKN2A (p16INK4a), CHEK2, CTNNA1, DICER1, EPCAM (Deletion/duplication testing only), GREM1 (promoter region deletion/duplication testing only), KIT, MEN1, MLH1, MSH2, MSH3, MSH6, MUTYH, NBN, NF1, NHTL1, PALB2, PDGFRA, PMS2, POLD1, POLE, PTEN, RAD50, RAD51C, RAD51D, RNF43, SDHB, SDHC, SDHD, SMAD4, SMARCA4. STK11, TP53, TSC1, TSC2, and VHL.  The  following genes were evaluated for sequence changes only: SDHA and HOXB13 c.251G>A variant only.      FAMILY HISTORY:  We obtained a detailed, 4-generation family history.  Significant diagnoses are listed below: Family History  Problem Relation Age of Onset  . Hypertension Father   . Hypertension Mother   . Heart failure Maternal Grandmother   . Emphysema Maternal Grandmother   . Diabetes Maternal Grandfather   . Parkinson's disease Paternal Grandmother   . Heart disease Paternal Grandfather   . Prostate cancer Paternal Grandfather 756     not metastatic  . Melanoma Maternal Aunt 58       sun exposure  . Cervical cancer Maternal Great-grandmother   . Breast cancer Neg Hx     Ms. McBilliotas a maternal aunt who had melanoma in her late 89sho frequently sunbathed prior to her diagnosis. She also has a maternal great-grandmother who had cervical cancer at an unknown age. There are no other known cancer diagnoses on her maternal side.  Ms. McKinserad a paternal grandfather who had prostate cancer at age 1620His prostate cancer was not metastatic. There are no other known cancer diagnoses on her paternal side.  Ms. McDaughetys unaware of previous family history of genetic testing for hereditary cancer risks. Ms. McPerrymanaternal ancestors are of European descent, and paternal ancestors are of [place] Native [place] descent. There is no reported Ashkenazi Jewish ancestry. There is no known consanguinity.    GENETIC TEST RESULTS: Initial genetic testing reported out on 10/13/2018 through the Breast Cancer STAT panel found no pathogenic variants. The STAT Breast cancer panel offered by [facility] includes sequencing and rearrangement analysis for the following 9 genes:  ATM, BRCA1, BRCA2, CDH1, CHEK2, PALB2,  PTEN, STK11 and TP53.   We discussed with Ms. Pankowski that because current genetic testing is not perfect, it is possible there may be a pathogenic variant in one of these genes that  current testing cannot detect, but that chance is small.  We also discussed that there could be another gene that has not yet been discovered, or that we have not yet tested, that is responsible for her cancer diagnosis. Therefore, it is important to remain in touch with cancer genetics in the future so that we can continue to offer Ms. Tankard the most up to date genetic testing.   At the time of Ms. Swickard's visit, we also recommended reflex testing to the Common Hereditary Cancers gene panel offered by [facility].  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    Since Ms. Hor has only one pathogenic variant in MUTYH, she is NOT affected with MYH-associated polyposis, but instead is a carrier. A copy of the test report has been scanned into Epic and is located under the Molecular Pathology section of the Results Review tab. A portion of the report is included below for reference.     Genetic testing did identify a variant of uncertain significance (VUS) was identified in the [place] gene called c.16G>A.  At this time, it is unknown if this variant is associated with increased cancer risk or if this is a normal finding, but most variants such as this get reclassified to being inconsequential. It should not be used to make medical management decisions. With time, we suspect the lab will determine the significance of this variant, if any. If we do learn more about it, we will try to contact Ms. Russi to discuss it further. However, it is important to stay in touch with [place] periodically and keep the address and phone number up to date.  SCREENING RECOMMENDATIONS: We discussed the implications of a heterozygous MUTYH pathogenic variant for Ms. Lupo, and discussed who else in the family should have genetic testing. We recommended Ms. Vanwagner follow the most updated medical management guidelines (NCCN Guidelines v3.2019) for heterozygous  MUTYH pathogenic variants; all of which are outlined below. These can be coordinated by Ms. Lowder's GI doctor or her primary provider.   Personal history of colon cancer  Follow instructions provided by your physician based on your personal history.  Do not have a personal history of colon cancer but have a parent/sibling/child with colon cancer: Colonoscopy every 5 years starting at age 68 or 78 years younger than the earliest age of onset, whichever is younger.  Do not have a personal history of colon cancer but do not have a parent/sibling/child with colon cancer: Data is uncertain on whether specialized screening is warranted.  Of note, Ms. Breon does not currently have a parent, sibling, or child affected with colon cancer. Therefore, no specialized screening is recommending based on her MUTYH pathogenic variant at this time.  FAMILY MEMBERS: Regarding Ms. Sapia's personal history of breast cancer: Individuals in her family might be at some increased risk of developing cancer over the general population risk simply due to the family history of cancer.  We recommended women in this family have a yearly mammogram beginning at age 10, or 59 years younger than the earliest onset of cancer, an annual clinical breast exam, and perform monthly breast self-exams. Women in this family should also have a gynecological exam as recommended by their primary provider. All family members  should have a colonoscopy by age 53.  Regarding Ms. Bergfeld's heterozygous MUTYH pathogenic variant: Since we now know the pathogenic variant in Ms. Presswood, we can test at-risk relatives to determine whether or not they have inherited the pathogenic variant to determine both their cancer risks and their reproductive risks. We will be happy to meet with any of the family members or refer them to a genetic counselor in their local area. To locate genetic counselors in other cities, individuals can visit the website of the  [employer] (ArtistMovie.[email or web address]) and Secretary/administrator for a Social worker by zip code.   FOLLOW-UP: Lastly, we discussed with Ms. Wojnar that cancer genetics is a rapidly advancing field and it is possible that new genetic tests will be appropriate for her and/or her family members in the future. We encouraged her to remain in contact with cancer genetics on an annual basis so we can update her personal and family histories and let her know of advances in cancer genetics that may benefit this family.   Our contact number was provided. Ms. Sara questions were answered to her satisfaction, and she knows she is welcome to call us at anytime with additional questions or concerns.   Clint Guy, MS Genetic Counselor [email or web address] Phone: [phone]

## 2018-10-26 ENCOUNTER — Other Ambulatory Visit: Payer: Self-pay

## 2018-10-26 ENCOUNTER — Encounter (HOSPITAL_BASED_OUTPATIENT_CLINIC_OR_DEPARTMENT_OTHER): Payer: Self-pay | Admitting: *Deleted

## 2018-10-30 ENCOUNTER — Other Ambulatory Visit (HOSPITAL_COMMUNITY)
Admission: RE | Admit: 2018-10-30 | Discharge: 2018-10-30 | Disposition: A | Discharge status: Home / Self Care | Payer: 59 | Source: Ambulatory Visit | Attending: Surgery | Admitting: Surgery

## 2018-10-30 DIAGNOSIS — Z01812 Encounter for preprocedural laboratory examination: Secondary | ICD-10-CM | POA: Insufficient documentation

## 2018-10-30 DIAGNOSIS — Z20828 Contact with and (suspected) exposure to other viral communicable diseases: Secondary | ICD-10-CM | POA: Insufficient documentation

## 2018-10-30 LAB — SARS CORONAVIRUS 2 (TAT 6-24 HRS): SARS Coronavirus 2: NEGATIVE

## 2018-11-01 ENCOUNTER — Other Ambulatory Visit: Payer: Self-pay

## 2018-11-01 ENCOUNTER — Encounter (HOSPITAL_BASED_OUTPATIENT_CLINIC_OR_DEPARTMENT_OTHER)
Admission: RE | Admit: 2018-11-01 | Discharge: 2018-11-01 | Disposition: A | Discharge status: Home / Self Care | Payer: 59 | Source: Ambulatory Visit | Attending: Surgery | Admitting: Surgery

## 2018-11-01 DIAGNOSIS — D0511 Intraductal carcinoma in situ of right breast: Secondary | ICD-10-CM | POA: Diagnosis not present

## 2018-11-01 DIAGNOSIS — K219 Gastro-esophageal reflux disease without esophagitis: Secondary | ICD-10-CM | POA: Diagnosis not present

## 2018-11-01 DIAGNOSIS — F419 Anxiety disorder, unspecified: Secondary | ICD-10-CM | POA: Diagnosis not present

## 2018-11-01 DIAGNOSIS — Z79899 Other long term (current) drug therapy: Secondary | ICD-10-CM | POA: Diagnosis not present

## 2018-11-01 DIAGNOSIS — Z6841 Body Mass Index (BMI) 40.0 and over, adult: Secondary | ICD-10-CM | POA: Diagnosis not present

## 2018-11-01 DIAGNOSIS — Z171 Estrogen receptor negative status [ER-]: Secondary | ICD-10-CM | POA: Diagnosis not present

## 2018-11-01 DIAGNOSIS — G43909 Migraine, unspecified, not intractable, without status migrainosus: Secondary | ICD-10-CM | POA: Diagnosis not present

## 2018-11-01 DIAGNOSIS — Z87891 Personal history of nicotine dependence: Secondary | ICD-10-CM | POA: Diagnosis not present

## 2018-11-01 LAB — COMPREHENSIVE METABOLIC PANEL
ALT: 15 U/L (ref 0–44)
AST: 17 U/L (ref 15–41)
Albumin: 3.3 g/dL — ABNORMAL LOW (ref 3.5–5.0)
Alkaline Phosphatase: 86 U/L (ref 38–126)
Anion gap: 10 (ref 5–15)
BUN: 10 mg/dL (ref 6–20)
CO2: 20 mmol/L — ABNORMAL LOW (ref 22–32)
Calcium: 9 mg/dL (ref 8.9–10.3)
Chloride: 109 mmol/L (ref 98–111)
Creatinine, Ser: 0.97 mg/dL (ref 0.44–1.00)
GFR calc Af Amer: >60 mL/min (ref >60)
GFR calc non Af Amer: >60 mL/min (ref >60)
Glucose, Bld: 106 mg/dL — ABNORMAL HIGH (ref 70–99)
Potassium: 3.7 mmol/L (ref 3.5–5.1)
Sodium: 139 mmol/L (ref 135–145)
Total Bilirubin: 0.2 mg/dL — ABNORMAL LOW (ref 0.3–1.2)
Total Protein: 6.7 g/dL (ref 6.5–8.1)

## 2018-11-01 LAB — CBC WITH DIFFERENTIAL/PLATELET
Abs Immature Granulocytes: 0.02 10*3/uL (ref 0.00–0.07)
Basophils Absolute: 0.1 10*3/uL (ref 0.0–0.1)
Basophils Relative: 1 %
Eosinophils Absolute: 0.1 10*3/uL (ref 0.0–0.5)
Eosinophils Relative: 2 %
HCT: 47.4 % — ABNORMAL HIGH (ref 36.0–46.0)
Hemoglobin: 15.5 g/dL — ABNORMAL HIGH (ref 12.0–15.0)
Immature Granulocytes: 0 %
Lymphocytes Relative: 38 %
Lymphs Abs: 2.9 10*3/uL (ref 0.7–4.0)
MCH: 28.6 pg (ref 26.0–34.0)
MCHC: 32.7 g/dL (ref 30.0–36.0)
MCV: 87.5 fL (ref 80.0–100.0)
Monocytes Absolute: 0.6 10*3/uL (ref 0.1–1.0)
Monocytes Relative: 7 %
Neutro Abs: 4 10*3/uL (ref 1.7–7.7)
Neutrophils Relative %: 52 %
Platelets: 248 10*3/uL (ref 150–400)
RBC: 5.42 MIL/uL — ABNORMAL HIGH (ref 3.87–5.11)
RDW: 13.5 % (ref 11.5–15.5)
WBC: 7.7 10*3/uL (ref 4.0–10.5)
nRBC: 0 % (ref 0.0–0.2)

## 2018-11-01 LAB — POCT PREGNANCY, URINE: Preg Test, Ur: NEGATIVE

## 2018-11-01 NOTE — Progress Notes (Signed)
Ensure pre surgery drink and surgical soap given with instructions, pt verbalized understanding. 

## 2018-11-02 ENCOUNTER — Ambulatory Visit
Admission: RE | Admit: 2018-11-02 | Discharge: 2018-11-02 | Disposition: A | Discharge status: Home / Self Care | Payer: 59 | Source: Ambulatory Visit | Attending: Surgery | Admitting: Surgery

## 2018-11-02 DIAGNOSIS — C50911 Malignant neoplasm of unspecified site of right female breast: Secondary | ICD-10-CM

## 2018-11-02 DIAGNOSIS — Z171 Estrogen receptor negative status [ER-]: Secondary | ICD-10-CM

## 2018-11-03 ENCOUNTER — Ambulatory Visit
Admission: RE | Admit: 2018-11-03 | Discharge: 2018-11-03 | Disposition: A | Discharge status: Home / Self Care | Payer: 59 | Source: Ambulatory Visit | Attending: Surgery | Admitting: Surgery

## 2018-11-03 ENCOUNTER — Ambulatory Visit (HOSPITAL_BASED_OUTPATIENT_CLINIC_OR_DEPARTMENT_OTHER)
Admission: RE | Admit: 2018-11-03 | Discharge: 2018-11-03 | Disposition: A | Discharge status: Home / Self Care | Payer: 59 | Attending: Surgery | Admitting: Surgery

## 2018-11-03 ENCOUNTER — Ambulatory Visit (HOSPITAL_COMMUNITY): Payer: 59

## 2018-11-03 ENCOUNTER — Encounter (HOSPITAL_BASED_OUTPATIENT_CLINIC_OR_DEPARTMENT_OTHER)
Admission: RE | Disposition: A | Discharge status: Home / Self Care | Payer: Self-pay | Source: Home / Self Care | Attending: Surgery

## 2018-11-03 ENCOUNTER — Ambulatory Visit (HOSPITAL_BASED_OUTPATIENT_CLINIC_OR_DEPARTMENT_OTHER): Payer: 59 | Admitting: Anesthesiology

## 2018-11-03 ENCOUNTER — Other Ambulatory Visit: Payer: Self-pay

## 2018-11-03 ENCOUNTER — Encounter (HOSPITAL_BASED_OUTPATIENT_CLINIC_OR_DEPARTMENT_OTHER): Payer: Self-pay | Admitting: *Deleted

## 2018-11-03 ENCOUNTER — Ambulatory Visit (HOSPITAL_COMMUNITY)
Admission: RE | Admit: 2018-11-03 | Discharge: 2018-11-03 | Disposition: A | Discharge status: Home / Self Care | Payer: 59 | Source: Ambulatory Visit | Attending: Surgery | Admitting: Surgery

## 2018-11-03 DIAGNOSIS — D0511 Intraductal carcinoma in situ of right breast: Secondary | ICD-10-CM | POA: Diagnosis not present

## 2018-11-03 DIAGNOSIS — Z79899 Other long term (current) drug therapy: Secondary | ICD-10-CM | POA: Insufficient documentation

## 2018-11-03 DIAGNOSIS — K219 Gastro-esophageal reflux disease without esophagitis: Secondary | ICD-10-CM | POA: Insufficient documentation

## 2018-11-03 DIAGNOSIS — C50911 Malignant neoplasm of unspecified site of right female breast: Secondary | ICD-10-CM

## 2018-11-03 DIAGNOSIS — Z95828 Presence of other vascular implants and grafts: Secondary | ICD-10-CM

## 2018-11-03 DIAGNOSIS — Z87891 Personal history of nicotine dependence: Secondary | ICD-10-CM | POA: Insufficient documentation

## 2018-11-03 DIAGNOSIS — F419 Anxiety disorder, unspecified: Secondary | ICD-10-CM | POA: Insufficient documentation

## 2018-11-03 DIAGNOSIS — G43909 Migraine, unspecified, not intractable, without status migrainosus: Secondary | ICD-10-CM | POA: Insufficient documentation

## 2018-11-03 DIAGNOSIS — Z6841 Body Mass Index (BMI) 40.0 and over, adult: Secondary | ICD-10-CM | POA: Insufficient documentation

## 2018-11-03 DIAGNOSIS — Z171 Estrogen receptor negative status [ER-]: Secondary | ICD-10-CM | POA: Insufficient documentation

## 2018-11-03 HISTORY — PX: BREAST LUMPECTOMY WITH RADIOACTIVE SEED AND SENTINEL LYMPH NODE BIOPSY: SHX6550

## 2018-11-03 HISTORY — PX: OPERATIVE ULTRASOUND: SHX5996

## 2018-11-03 HISTORY — PX: PORTACATH PLACEMENT: SHX2246

## 2018-11-03 SURGERY — BREAST LUMPECTOMY WITH RADIOACTIVE SEED AND SENTINEL LYMPH NODE BIOPSY
Anesthesia: General | Site: Neck | Laterality: Right

## 2018-11-03 MED ORDER — LACTATED RINGERS IV SOLN
INTRAVENOUS | Status: DC
  Administered 2018-11-03 (×2): via INTRAVENOUS

## 2018-11-03 MED ORDER — HEPARIN (PORCINE) IN NACL 2-0.9 UNITS/ML
INTRAMUSCULAR | Status: AC | PRN
  Administered 2018-11-03: 500 mL via INTRAVENOUS

## 2018-11-03 MED ORDER — CEFAZOLIN SODIUM-DEXTROSE 2-4 GM/100ML-% IV SOLN
2.0000 g | INTRAVENOUS | Status: AC
  Administered 2018-11-03: 2 g via INTRAVENOUS

## 2018-11-03 MED ORDER — SCOPOLAMINE 1 MG/3DAYS TD PT72: 1.0000 | MEDICATED_PATCH | Freq: Once | TRANSDERMAL | Status: DC

## 2018-11-03 MED ORDER — ACETAMINOPHEN 500 MG PO TABS
1000.0000 mg | ORAL_TABLET | ORAL | Status: AC
  Administered 2018-11-03: 1000 mg via ORAL

## 2018-11-03 MED ORDER — MIDAZOLAM HCL 2 MG/2ML IJ SOLN
INTRAMUSCULAR | Status: AC
  Filled 2018-11-03: qty 2

## 2018-11-03 MED ORDER — FENTANYL CITRATE (PF) 100 MCG/2ML IJ SOLN
INTRAMUSCULAR | Status: AC
  Filled 2018-11-03: qty 2

## 2018-11-03 MED ORDER — CEFAZOLIN SODIUM-DEXTROSE 2-4 GM/100ML-% IV SOLN
INTRAVENOUS | Status: AC
  Filled 2018-11-03: qty 100

## 2018-11-03 MED ORDER — MEPERIDINE HCL 25 MG/ML IJ SOLN: 6.2500 mg | INTRAMUSCULAR | Status: DC | PRN

## 2018-11-03 MED ORDER — PROPOFOL 10 MG/ML IV BOLUS
INTRAVENOUS | Status: DC | PRN
  Administered 2018-11-03: 180 mg via INTRAVENOUS

## 2018-11-03 MED ORDER — IBUPROFEN 800 MG PO TABS: 800.0000 mg | ORAL_TABLET | Freq: Three times a day (TID) | ORAL | 0 refills | Status: DC | PRN

## 2018-11-03 MED ORDER — TECHNETIUM TC 99M SULFUR COLLOID FILTERED: 1.0000 | Freq: Once | INTRAVENOUS | Status: DC | PRN

## 2018-11-03 MED ORDER — ACETAMINOPHEN 500 MG PO TABS
ORAL_TABLET | ORAL | Status: AC
  Filled 2018-11-03: qty 2

## 2018-11-03 MED ORDER — OXYCODONE HCL 5 MG PO TABS: 5.0000 mg | ORAL_TABLET | Freq: Four times a day (QID) | ORAL | 0 refills | Status: DC | PRN

## 2018-11-03 MED ORDER — METOCLOPRAMIDE HCL 5 MG/ML IJ SOLN: 10.0000 mg | Freq: Once | INTRAMUSCULAR | Status: DC | PRN

## 2018-11-03 MED ORDER — HEPARIN SOD (PORK) LOCK FLUSH 100 UNIT/ML IV SOLN
INTRAVENOUS | Status: DC | PRN
  Administered 2018-11-03: 500 [IU] via INTRAVENOUS

## 2018-11-03 MED ORDER — ONDANSETRON HCL 4 MG/2ML IJ SOLN
INTRAMUSCULAR | Status: AC
  Filled 2018-11-03: qty 2

## 2018-11-03 MED ORDER — FENTANYL CITRATE (PF) 100 MCG/2ML IJ SOLN
50.0000 ug | INTRAMUSCULAR | Status: AC | PRN
  Administered 2018-11-03 (×3): 50 ug via INTRAVENOUS
  Administered 2018-11-03 (×2): 25 ug via INTRAVENOUS
  Administered 2018-11-03: 100 ug via INTRAVENOUS

## 2018-11-03 MED ORDER — DEXAMETHASONE SODIUM PHOSPHATE 10 MG/ML IJ SOLN
INTRAMUSCULAR | Status: AC
  Filled 2018-11-03: qty 1

## 2018-11-03 MED ORDER — BUPIVACAINE-EPINEPHRINE 0.25% -1:200000 IJ SOLN
INTRAMUSCULAR | Status: DC | PRN
  Administered 2018-11-03: 30 mL

## 2018-11-03 MED ORDER — PROPOFOL 10 MG/ML IV BOLUS
INTRAVENOUS | Status: AC
  Filled 2018-11-03: qty 40

## 2018-11-03 MED ORDER — CHLORHEXIDINE GLUCONATE CLOTH 2 % EX PADS: 6.0000 | MEDICATED_PAD | Freq: Once | CUTANEOUS | Status: DC

## 2018-11-03 MED ORDER — LACTATED RINGERS IV SOLN: INTRAVENOUS | Status: DC

## 2018-11-03 MED ORDER — GABAPENTIN 300 MG PO CAPS
300.0000 mg | ORAL_CAPSULE | ORAL | Status: AC
  Administered 2018-11-03: 300 mg via ORAL

## 2018-11-03 MED ORDER — LIDOCAINE 2% (20 MG/ML) 5 ML SYRINGE
INTRAMUSCULAR | Status: AC
  Filled 2018-11-03: qty 5

## 2018-11-03 MED ORDER — DEXAMETHASONE SODIUM PHOSPHATE 4 MG/ML IJ SOLN
INTRAMUSCULAR | Status: DC | PRN
  Administered 2018-11-03: 5 mg via INTRAVENOUS

## 2018-11-03 MED ORDER — FENTANYL CITRATE (PF) 100 MCG/2ML IJ SOLN
25.0000 ug | INTRAMUSCULAR | Status: DC | PRN
  Administered 2018-11-03 (×2): 50 ug via INTRAVENOUS

## 2018-11-03 MED ORDER — CEFAZOLIN SODIUM-DEXTROSE 2-4 GM/100ML-% IV SOLN: 2.0000 g | INTRAVENOUS | Status: DC

## 2018-11-03 MED ORDER — GABAPENTIN 300 MG PO CAPS
ORAL_CAPSULE | ORAL | Status: AC
  Filled 2018-11-03: qty 1

## 2018-11-03 MED ORDER — ROPIVACAINE HCL 5 MG/ML IJ SOLN
INTRAMUSCULAR | Status: DC | PRN
  Administered 2018-11-03: 30 mL via PERINEURAL

## 2018-11-03 MED ORDER — LIDOCAINE HCL 1 % IJ SOLN
INTRAMUSCULAR | Status: DC | PRN
  Administered 2018-11-03: 40 mg via INTRADERMAL

## 2018-11-03 MED ORDER — MIDAZOLAM HCL 2 MG/2ML IJ SOLN
1.0000 mg | INTRAMUSCULAR | Status: DC | PRN
  Administered 2018-11-03: 2 mg via INTRAVENOUS
  Administered 2018-11-03 (×2): 1 mg via INTRAVENOUS

## 2018-11-03 MED ORDER — ONDANSETRON HCL 4 MG/2ML IJ SOLN
INTRAMUSCULAR | Status: DC | PRN
  Administered 2018-11-03: 4 mg via INTRAVENOUS

## 2018-11-03 SURGICAL SUPPLY — 77 items
APPLIER CLIP 9.375 MED OPEN (MISCELLANEOUS) ×4
BAG DECANTER FOR FLEXI CONT (MISCELLANEOUS) ×4 IMPLANT
BENZOIN TINCTURE PRP APPL 2/3 (GAUZE/BANDAGES/DRESSINGS) IMPLANT
BINDER BREAST 3XL (GAUZE/BANDAGES/DRESSINGS) ×4 IMPLANT
BINDER BREAST LRG (GAUZE/BANDAGES/DRESSINGS) IMPLANT
BINDER BREAST MEDIUM (GAUZE/BANDAGES/DRESSINGS) IMPLANT
BINDER BREAST XLRG (GAUZE/BANDAGES/DRESSINGS) IMPLANT
BINDER BREAST XXLRG (GAUZE/BANDAGES/DRESSINGS) IMPLANT
BLADE HEX COATED 2.75 (ELECTRODE) IMPLANT
BLADE SURG 11 STRL SS (BLADE) ×4 IMPLANT
BLADE SURG 15 STRL LF DISP TIS (BLADE) ×2 IMPLANT
BLADE SURG 15 STRL SS (BLADE) ×2
CANISTER SUC SOCK COL 7IN (MISCELLANEOUS) IMPLANT
CANISTER SUCT 1200ML W/VALVE (MISCELLANEOUS) ×4 IMPLANT
CHLORAPREP W/TINT 26 (MISCELLANEOUS) ×4 IMPLANT
CLIP APPLIE 9.375 MED OPEN (MISCELLANEOUS) ×2 IMPLANT
CLOSURE WOUND 1/2 X4 (GAUZE/BANDAGES/DRESSINGS)
COVER BACK TABLE REUSABLE LG (DRAPES) ×4 IMPLANT
COVER MAYO STAND REUSABLE (DRAPES) ×4 IMPLANT
COVER PROBE 5X48 (MISCELLANEOUS) ×2
COVER PROBE W GEL 5X96 (DRAPES) ×4 IMPLANT
COVER WAND RF STERILE (DRAPES) IMPLANT
DECANTER SPIKE VIAL GLASS SM (MISCELLANEOUS) IMPLANT
DERMABOND ADVANCED (GAUZE/BANDAGES/DRESSINGS) ×2
DERMABOND ADVANCED .7 DNX12 (GAUZE/BANDAGES/DRESSINGS) ×2 IMPLANT
DRAPE C-ARM 42X72 X-RAY (DRAPES) ×4 IMPLANT
DRAPE LAPAROSCOPIC ABDOMINAL (DRAPES) ×4 IMPLANT
DRAPE UTILITY XL STRL (DRAPES) ×8 IMPLANT
DRSG TEGADERM 2-3/8X2-3/4 SM (GAUZE/BANDAGES/DRESSINGS) IMPLANT
DRSG TEGADERM 4X4.75 (GAUZE/BANDAGES/DRESSINGS) IMPLANT
ELECT COATED BLADE 2.86 ST (ELECTRODE) ×4 IMPLANT
ELECT REM PT RETURN 9FT ADLT (ELECTROSURGICAL) ×4
ELECTRODE REM PT RTRN 9FT ADLT (ELECTROSURGICAL) ×2 IMPLANT
GAUZE SPONGE 4X4 12PLY STRL LF (GAUZE/BANDAGES/DRESSINGS) IMPLANT
GLOVE BIOGEL PI IND STRL 7.0 (GLOVE) ×2 IMPLANT
GLOVE BIOGEL PI IND STRL 8 (GLOVE) ×2 IMPLANT
GLOVE BIOGEL PI INDICATOR 7.0 (GLOVE) ×2
GLOVE BIOGEL PI INDICATOR 8 (GLOVE) ×2
GLOVE ECLIPSE 8.0 STRL XLNG CF (GLOVE) ×4 IMPLANT
GLOVE EXAM NITRILE MD LF STRL (GLOVE) ×4 IMPLANT
GLOVE SURG SS PI 6.5 STRL IVOR (GLOVE) ×4 IMPLANT
GOWN STRL REUS W/ TWL LRG LVL3 (GOWN DISPOSABLE) ×4 IMPLANT
GOWN STRL REUS W/TWL LRG LVL3 (GOWN DISPOSABLE) ×4
HEMOSTAT ARISTA ABSORB 3G PWDR (HEMOSTASIS) ×4 IMPLANT
HEMOSTAT SNOW SURGICEL 2X4 (HEMOSTASIS) IMPLANT
IV KIT MINILOC 20X1 SAFETY (NEEDLE) IMPLANT
KIT CVR 48X5XPRB PLUP LF (MISCELLANEOUS) ×2 IMPLANT
KIT MARKER MARGIN INK (KITS) ×4 IMPLANT
KIT PORT POWER 8FR ISP CVUE (Port) ×4 IMPLANT
NDL SAFETY ECLIPSE 18X1.5 (NEEDLE) IMPLANT
NEEDLE HYPO 18GX1.5 SHARP (NEEDLE)
NEEDLE HYPO 22GX1.5 SAFETY (NEEDLE) IMPLANT
NEEDLE HYPO 25X1 1.5 SAFETY (NEEDLE) ×4 IMPLANT
NEEDLE SPNL 22GX3.5 QUINCKE BK (NEEDLE) IMPLANT
NS IRRIG 1000ML POUR BTL (IV SOLUTION) ×4 IMPLANT
PACK BASIN DAY SURGERY FS (CUSTOM PROCEDURE TRAY) ×4 IMPLANT
PENCIL BUTTON HOLSTER BLD 10FT (ELECTRODE) ×4 IMPLANT
SET SHEATH INTRODUCER 10FR (MISCELLANEOUS) IMPLANT
SHEATH COOK PEEL AWAY SET 9F (SHEATH) IMPLANT
SLEEVE SCD COMPRESS KNEE MED (MISCELLANEOUS) ×4 IMPLANT
SPONGE LAP 4X18 RFD (DISPOSABLE) ×4 IMPLANT
STRIP CLOSURE SKIN 1/2X4 (GAUZE/BANDAGES/DRESSINGS) IMPLANT
SUT MNCRL AB 4-0 PS2 18 (SUTURE) ×8 IMPLANT
SUT MON AB 4-0 PC3 18 (SUTURE) ×4 IMPLANT
SUT PROLENE 2 0 CT2 30 (SUTURE) IMPLANT
SUT PROLENE 2 0 SH DA (SUTURE) ×4 IMPLANT
SUT SILK 2 0 TIES 17X18 (SUTURE)
SUT SILK 2-0 18XBRD TIE BLK (SUTURE) IMPLANT
SUT VICRYL 3-0 CR8 SH (SUTURE) ×4 IMPLANT
SYR 5ML LUER SLIP (SYRINGE) ×4 IMPLANT
SYR BULB 3OZ (MISCELLANEOUS) ×4 IMPLANT
SYR CONTROL 10ML LL (SYRINGE) ×8 IMPLANT
TOWEL GREEN STERILE FF (TOWEL DISPOSABLE) ×8 IMPLANT
TRAY FAXITRON CT DISP (TRAY / TRAY PROCEDURE) ×4 IMPLANT
TUBE CONNECTING 20'X1/4 (TUBING) ×1
TUBE CONNECTING 20X1/4 (TUBING) ×3 IMPLANT
YANKAUER SUCT BULB TIP NO VENT (SUCTIONS) ×4 IMPLANT

## 2018-11-03 NOTE — Progress Notes (Signed)
Emotional support during breast injections °

## 2018-11-03 NOTE — Anesthesia Procedure Notes (Signed)
Anesthesia Regional Block: Pectoralis block   Pre-Anesthetic Checklist: ,, timeout performed, Correct Patient, Correct Site, Correct Laterality, Correct Procedure, Correct Position, site marked, Risks and benefits discussed,  Surgical consent,  Pre-op evaluation,  At surgeon's request and post-op pain management  Laterality: Right  Prep: Maximum Sterile Barrier Precautions used, chloraprep       Needles:  Injection technique: Single-shot  Needle Type: Echogenic Stimulator Needle     Needle Length: 10cm      Additional Needles:   Procedures:,,,, ultrasound used (permanent image in chart),,,,  Narrative:  Start time: 11/03/2018 9:41 AM End time: 11/03/2018 9:51 AM Injection made incrementally with aspirations every 5 mL.  Performed by: Personally  Anesthesiologist: Montez Hageman, MD  Additional Notes: Risks, benefits and alternative to block explained extensively.  Patient tolerated procedure well, without complications.

## 2018-11-03 NOTE — Anesthesia Procedure Notes (Signed)
Procedure Name: LMA Insertion Date/Time: 11/03/2018 10:43 AM Performed by: Garrel Ridgel, CRNA Pre-anesthesia Checklist: Patient identified, Emergency Drugs available, Suction available, Patient being monitored and Timeout performed Patient Re-evaluated:Patient Re-evaluated prior to induction Oxygen Delivery Method: Circle system utilized Induction Type: IV induction Ventilation: Mask ventilation without difficulty LMA: LMA inserted LMA Size: 4.0 Number of attempts: 1 Tube secured with: Tape Dental Injury: Teeth and Oropharynx as per pre-operative assessment

## 2018-11-03 NOTE — Anesthesia Preprocedure Evaluation (Signed)
Anesthesia Evaluation  Patient identified by MRN, date of birth, ID band Patient awake    Reviewed: Allergy & Precautions, NPO status , Patient's Chart, lab work & pertinent test results  Airway Mallampati: II  TM Distance: >3 FB Neck ROM: Full    Dental no notable dental hx.    Pulmonary neg pulmonary ROS,    Pulmonary exam normal breath sounds clear to auscultation       Cardiovascular negative cardio ROS Normal cardiovascular exam Rhythm:Regular Rate:Normal     Neuro/Psych negative neurological ROS  negative psych ROS   GI/Hepatic Neg liver ROS, GERD  Controlled,  Endo/Other  Morbid obesity  Renal/GU negative Renal ROS  negative genitourinary   Musculoskeletal negative musculoskeletal ROS (+)   Abdominal   Peds negative pediatric ROS (+)  Hematology negative hematology ROS (+)   Anesthesia Other Findings   Reproductive/Obstetrics negative OB ROS                             Anesthesia Physical Anesthesia Plan  ASA: III  Anesthesia Plan: General   Post-op Pain Management:  Regional for Post-op pain   Induction: Intravenous  PONV Risk Score and Plan: 3 and Ondansetron, Dexamethasone, Midazolam and Treatment may vary due to age or medical condition  Airway Management Planned: LMA  Additional Equipment:   Intra-op Plan:   Post-operative Plan: Extubation in OR  Informed Consent: I have reviewed the patients History and Physical, chart, labs and discussed the procedure including the risks, benefits and alternatives for the proposed anesthesia with the patient or authorized representative who has indicated his/her understanding and acceptance.     Dental advisory given  Plan Discussed with: CRNA  Anesthesia Plan Comments:         Anesthesia Quick Evaluation

## 2018-11-03 NOTE — Assessment & Plan Note (Signed)
10/06/2018:Screening mammogram detected right breast mass. Diagnostic mammogram showed 45m mass with adjacent 31mmass in the right breast with no axillary adenopathy. 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   Recommendations: 1. Breast conserving surgery followed by 2.  Adjuvant chemotherapy with dose dense Adriamycin and Cytoxan followed by Taxol weekly x12 (the final tumor is 6 mm or greater) 3. Adjuvant radiation therapy followed by 4. Adjuvant antiestrogen therapy  Patient works as a [employer], applicationsnd her husband KeLennette Bihariorks as a poEngineer, structural---------------------------------------------------------------------------------------------------------------------------------------- 11/03/2018: Right lumpectomy  Pathology counseling: I discussed the final pathology report of the patient provided  a copy of this report. I discussed the margins as well as lymph node surgeries. We also discussed the final staging along with previously performed ER/PR and HER-2/neu testing.

## 2018-11-03 NOTE — Op Note (Signed)
Preoperative diagnosis: Right breast cancer stage I lower outer quadrant  Postoperative diagnosis: Same  Procedure: Right breast seed localized lumpectomy with right axillary sentinel lymph node mapping of deep right axillary lymph node with placement of right internal jugular 8 French [place] Port-A-Cath using ultrasound and C arm guidance  Surgeon: Erroll Luna, MD  Anesthesia: LMA with pectoral block and local  EBL: 70 cc  Specimen: Right breast tissue with seed and clip verified by Faxitron and 3 right axillary sentinel nodes hot  Drains: None  IV fluids: Per anesthesia record  Indications for procedure: Patient is a 43 year old female with stage I right breast cancer.  She opted for breast conserving surgery and only postoperative chemotherapy after medical oncology evaluation.The procedure has been discussed with the patient. Alternatives to surgery have been discussed with the patient.  Risks of surgery include bleeding,  Infection,  Seroma formation, death,  and the need for further surgery.   The patient understands and wishes to proceed.The procedure has been discussed with the patient. Alternatives to surgery have been discussed with the patient.  Risks of surgery include bleeding,  Infection,  Seroma formation, death,  and the need for further surgery.   The patient understands and wishes to proceed.Sentinel lymph node mapping and dissection has been discussed with the patient.  Risk of bleeding,  Infection,  Seroma formation,  Additional procedures,,  Shoulder weakness ,  Shoulder stiffness,  Nerve and blood vessel injury and reaction to the mapping dyes have been discussed.  Alternatives to surgery have been discussed with the patient.  The patient agrees to proceed.  Risk of port placement include bleeding, infection, vascular injury, nerve injury, injury to structures in the neck and mediastinum, hemothorax, pneumothorax, injury to heart and major blood vessels, catheter  infection, catheter migration, catheter erosion, and the need further treatments.  Also risk of blood clots related to catheter use as well.  Description of procedure: The patient was met in the holding area.  Neoprobe used to verify seed location and questions were answered.  Right side was marked as correct.  She underwent pectoral block and injection of technetium sulfur colloid by degree medicine.  She was then taken to the operative room placed supine.  After induction of LMA anesthesia, the right breast and neck region were prepped and draped in sterile fashion timeout was done.  The Port-A-Cath was placed first.  Her arm was brought to her side.  Ultrasound used to identify the right internal jugular vein and a needle was inserted directly under ultrasound guidance.  A wire was fed through this it was C-arm guidance was fed down through the superior vena cava down through the inferior vena cava.  A small stab incision was made at the wire insertion site.  A second incision was made below the clavicle measuring 2 cm using a scalpel.  A pocket was made from this and an 8 French port was brought to the field attached.  It was flushed.  It was tunneled from lower incision to the upper incision.  With the patient Trendelenburg a dilator introducer complex was fed over the wire moving the wire to and fro without resistance.  This was put in place and then the wire and dilator were removed.  The peel-away sheath was left in place the catheter was cut to 20 cm and fed through this.  The peel-away sheath was peeled away without difficulty.  C arm images obtained showed the catheter in the very distal superior vena  cava.  There is no kinking.  We then drew back on the port and got dark nonpulsatile blood and this was flushed with heparinized saline.  5 cc of half saline were placed to hep lock it.  This is secured to the chest wall with 2-0 Prolene.  Incisions closed with 3-0 Vicryl and 4-0 Monocryl.  The arm was  placed back out on the arm board.  Neoprobe used and seed location minified lower right breast.  Incision made along the inframammary crease to try to hide the incision.  Dissection was carried up in all tissue around the seed and clip were excised with a grossly negative margin.  Faxitron revealed both seed and clip to be in specimen.  This was made hemostatic and clip for radiation therapy.  He was then closed with 3-0 Vicryl and 4-0 Monocryl.  The neoprobe was changed to technetium.  Hot spot identified in the right axilla and a 4 cm incision was made along the right inferior axillary hairline.  Dissection was carried in the level 1 deep contents.  There were 3 nodes that were hot and these were excised for level 1 nodes.  Background counts approached 0.  Hemostasis achieved with cautery and Arista.  Cavity closed with 3-0 Vicryl and 4-0 Monocryl.  Dermabond applied.  Of note to the long thoracic nerve, thoracodorsal trunk and axillary vein were all preserved.  Upon closure Dermabond was placed in all incisions.  Breast binder placed.  All counts were found to be correct.  The patient was awoke extubated taken recovery in satisfactory condition.

## 2018-11-03 NOTE — Discharge Instructions (Signed)
[facility] °Office Phone Number [phone] ° °BREAST BIOPSY/ PARTIAL MASTECTOMY: POST OP INSTRUCTIONS ° °Always review your discharge instruction sheet given to you by the facility where your surgery was performed. ° °IF YOU HAVE DISABILITY OR FAMILY LEAVE FORMS, YOU MUST BRING THEM TO THE OFFICE FOR PROCESSING.  DO NOT GIVE THEM TO YOUR DOCTOR. ° °1. A prescription for pain medication may be given to you upon discharge.  Take your pain medication as prescribed, if needed.  If narcotic pain medicine is not needed, then you may take acetaminophen (Tylenol) or ibuprofen (Advil) as needed. °2. Take your usually prescribed medications unless otherwise directed °3. If you need a refill on your pain medication, please contact your pharmacy.  They will contact our office to request authorization.  Prescriptions will not be filled after 5pm or on week-ends. °4. You should eat very light the first 24 hours after surgery, such as soup, crackers, pudding, etc.  Resume your normal diet the day after surgery. °5. Most patients will experience some swelling and bruising in the breast.  Ice packs and a good support bra will help.  Swelling and bruising can take several days to resolve.  °6. It is common to experience some constipation if taking pain medication after surgery.  Increasing fluid intake and taking a stool softener will usually help or prevent this problem from occurring.  A mild laxative (Milk of Magnesia or Miralax) should be taken according to package directions if there are no bowel movements after 48 hours. °7. Unless discharge instructions indicate otherwise, you may remove your bandages 24-48 hours after surgery, and you may shower at that time.  You may have steri-strips (small skin tapes) in place directly over the incision.  These strips should be left on the skin for 7-10 days.  If your surgeon used skin glue on the incision, you may shower in 24 hours.  The glue will flake off over the  next 2-3 weeks.  Any sutures or staples will be removed at the office during your follow-up visit. °8. ACTIVITIES:  You may resume regular daily activities (gradually increasing) beginning the next day.  Wearing a good support bra or sports bra minimizes pain and swelling.  You may have sexual intercourse when it is comfortable. °a. You may drive when you no longer are taking prescription pain medication, you can comfortably wear a seatbelt, and you can safely maneuver your car and apply brakes. °b. RETURN TO WORK:  ______________________________________________________________________________________ °9. You should see your doctor in the office for a follow-up appointment approximately two weeks after your surgery.  Your doctor’s nurse will typically make your follow-up appointment when she calls you with your pathology report.  Expect your pathology report 2-3 business days after your surgery.  You may call to check if you do not hear from us after three days. °10. OTHER INSTRUCTIONS: _______________________________________________________________________________________________ _____________________________________________________________________________________________________________________________________ °_____________________________________________________________________________________________________________________________________ °_____________________________________________________________________________________________________________________________________ ° °WHEN TO CALL YOUR DOCTOR: °1. Fever over 101.0 °2. Nausea and/or vomiting. °3. Extreme swelling or bruising. °4. Continued bleeding from incision. °5. Increased pain, redness, or drainage from the incision. ° °The clinic staff is available to answer your questions during regular business hours.  Please don’t hesitate to call and ask to speak to one of the nurses for clinical concerns.  If you have a medical emergency, go to the nearest  emergency room or call 911.  A surgeon from [facility] is always on call at the hospital. ° °For further questions, please visit [email or web address]  ° ° ° ° °  PORT-A-CATH: POST OP INSTRUCTIONS  Always review your discharge instruction sheet given to you by the facility where your surgery was performed.   1. A prescription for pain medication may be given to you upon discharge. Take your pain medication as prescribed, if needed. If narcotic pain medicine is not needed, then you make take acetaminophen (Tylenol) or ibuprofen (Advil) as needed.  2. Take your usually prescribed medications unless otherwise directed. 3. If you need a refill on your pain medication, please contact our office. All narcotic pain medicine now requires a paper prescription.  Phoned in and fax refills are no longer allowed by law.  Prescriptions will not be filled after 5 pm or on weekends.  4. You should follow a light diet for the remainder of the day after your procedure. 5. Most patients will experience some mild swelling and/or bruising in the area of the incision. It may take several days to resolve. 6. It is common to experience some constipation if taking pain medication after surgery. Increasing fluid intake and taking a stool softener (such as Colace) will usually help or prevent this problem from occurring. A mild laxative (Milk of Magnesia or Miralax) should be taken according to package directions if there are no bowel movements after 48 hours.  7. Unless discharge instructions indicate otherwise, you may remove your bandages 48 hours after surgery, and you may shower at that time. You may have steri-strips (small white skin tapes) in place directly over the incision.  These strips should be left on the skin for 7-10 days.  If your surgeon used Dermabond (skin glue) on the incision, you may shower in 24 hours.  The glue will flake off over the next 2-3 weeks.  8. If your port is left accessed at the  end of surgery (needle left in port), the dressing cannot get wet and should only by changed by a healthcare professional. When the port is no longer accessed (when the needle has been removed), follow step 7.   9. ACTIVITIES:  Limit activity involving your arms for the next 72 hours. Do no strenuous exercise or activity for 1 week. You may drive when you are no longer taking prescription pain medication, you can comfortably wear a seatbelt, and you can maneuver your car. 10.You may need to see your doctor in the office for a follow-up appointment.  Please       check with your doctor.  11.When you receive a new Port-a-Cath, you will get a product guide and        ID card.  Please keep them in case you need them.  WHEN TO CALL YOUR DOCTOR [phone]): 1. Fever over 101.0 2. Chills 3. Continued bleeding from incision 4. Increased redness and tenderness at the site 5. Shortness of breath, difficulty breathing   The clinic staff is available to answer your questions during regular business hours. Please dont hesitate to call and ask to speak to one of the nurses or medical assistants for clinical concerns. If you have a medical emergency, go to the nearest emergency room or call 911.  A surgeon from [facility] is always on call at the hospital.     For further information, please visit [email or web address]    Post Anesthesia Home Care Instructions  Activity: Get plenty of rest for the remainder of the day. A responsible individual must stay with you for 24 hours following the procedure.  For the next 24 hours, DO NOT: -Drive a car -Operate  machinery -Drink alcoholic beverages -Take any medication unless instructed by your physician -Make any legal decisions or sign important papers.  Meals: Start with liquid foods such as gelatin or soup. Progress to regular foods as tolerated. Avoid greasy, spicy, heavy foods. If nausea and/or vomiting occur, drink only  clear liquids until the nausea and/or vomiting subsides. Call your physician if vomiting continues.  Special Instructions/Symptoms: Your throat may feel dry or sore from the anesthesia or the breathing tube placed in your throat during surgery. If this causes discomfort, gargle with warm salt water. The discomfort should disappear within 24 hours.  If you had a scopolamine patch placed behind your ear for the management of post- operative nausea and/or vomiting:  1. The medication in the patch is effective for 72 hours, after which it should be removed.  Wrap patch in a tissue and discard in the trash. Wash hands thoroughly with soap and water. 2. You may remove the patch earlier than 72 hours if you experience unpleasant side effects which may include dry mouth, dizziness or visual disturbances. 3. Avoid touching the patch. Wash your hands with soap and water after contact with the patch.

## 2018-11-03 NOTE — Progress Notes (Signed)
Assisted Dr. Carignan with right, ultrasound guided, pectoralis block. Side rails up, monitors on throughout procedure. See vital signs in flow sheet. Tolerated Procedure well. 

## 2018-11-03 NOTE — Anesthesia Postprocedure Evaluation (Signed)
Anesthesia Post Note  Patient: [name]  Procedure(s) Performed: RIGHT BREAST LUMPECTOMY WITH RADIOACTIVE SEED AND RIGHT SENTINEL LYMPH NODE MAPPING (Right Breast) INSERTION PORT-A-CATH (Right Neck) OPERATIVE ULTRASOUND (Right Neck)     Patient location during evaluation: PACU Anesthesia Type: General Level of consciousness: awake and alert Pain management: pain level controlled Vital Signs Assessment: post-procedure vital signs reviewed and stable Respiratory status: spontaneous breathing, nonlabored ventilation, respiratory function stable and patient connected to nasal cannula oxygen Cardiovascular status: blood pressure returned to baseline and stable Postop Assessment: no apparent nausea or vomiting Anesthetic complications: no    Last Vitals:  Vitals:   11/03/18 1245 11/03/18 1300  BP: 126/77 137/67  Pulse: 98 (!) 102  Resp: 15 16  Temp:    SpO2: 99% 99%    Last Pain:  Vitals:   11/03/18 1300  TempSrc:   PainSc: 4                  Montez Hageman

## 2018-11-03 NOTE — Transfer of Care (Signed)
Immediate Anesthesia Transfer of Care Note  Patient: [name]  Procedure(s) Performed: RIGHT BREAST LUMPECTOMY WITH RADIOACTIVE SEED AND RIGHT SENTINEL LYMPH NODE MAPPING (Right Breast) INSERTION PORT-A-CATH (Right Neck) OPERATIVE ULTRASOUND (Right Neck)  Patient Location: PACU  Anesthesia Type:General  Level of Consciousness: awake, alert , oriented and patient cooperative  Airway & Oxygen Therapy: Patient Spontanous Breathing and Patient connected to nasal cannula oxygen  Post-op Assessment: Report given to RN and Post -op Vital signs reviewed and stable  Post vital signs: Reviewed and stable  Last Vitals:  Vitals Value Taken Time  BP 113/97 11/03/18 1230  Temp    Pulse 104 11/03/18 1232  Resp 13 11/03/18 1232  SpO2 98 % 11/03/18 1232  Vitals shown include unvalidated device data.  Last Pain:  Vitals:   11/03/18 0851  TempSrc: Oral  PainSc: 0-No pain      Patients Stated Pain Goal: 1 (11/46/43 1427)  Complications: No apparent anesthesia complications

## 2018-11-03 NOTE — Interval H&P Note (Signed)
History and Physical Interval Note:  11/03/2018 10:06 AM  [name]  has presented today for surgery, with the diagnosis of RIGHT BREAST CANCER.  The various methods of treatment have been discussed with the patient and family. After consideration of risks, benefits and other options for treatment, the patient has consented to  Procedure(s): RIGHT BREAST LUMPECTOMY WITH RADIOACTIVE SEED AND RIGHT SENTINEL LYMPH NODE MAPPING (Right) INSERTION PORT-A-CATH (N/A) OPERATIVE ULTRASOUND (N/A) as a surgical intervention.  The patient's history has been reviewed, patient examined, no change in status, stable for surgery.  I have reviewed the patient's chart and labs.  Questions were answered to the patient's satisfaction.     [place]

## 2018-11-04 ENCOUNTER — Encounter (HOSPITAL_BASED_OUTPATIENT_CLINIC_OR_DEPARTMENT_OTHER): Payer: Self-pay | Admitting: Surgery

## 2018-11-09 NOTE — Progress Notes (Signed)
Patient Care Team: Annye English as PCP - General (Physician Assistant) Rockwell Germany, RN as Oncology Nurse Navigator Mauro Kaufmann, RN as Oncology Nurse Navigator  DIAGNOSIS:    ICD-10-CM   1. Malignant neoplasm of lower-outer quadrant of right breast of female, estrogen receptor negative ([place])  C50.511    Z17.1     SUMMARY OF ONCOLOGIC HISTORY: Oncology History  Malignant neoplasm of lower-outer quadrant of right breast of female, estrogen receptor negative ([place])  09/27/2018 Cancer Staging   Staging form: Breast, AJCC 8th Edition - Clinical stage from 09/27/2018: Stage IB (cT1b, cN0, cM0, G3, ER-, PR-, HER2-) - Signed by Gardenia Phlegm, NP on 10/06/2018   10/06/2018 Initial Diagnosis   Screening mammogram detected right breast mass. Diagnostic mammogram showed 47m mass with adjacent 375mmass in the right breast with no axillary adenopathy. 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  The report date is 10/13/2018.  The Common Hereditary Gene Panel offered by [facility] includes sequencing and/or deletion duplication testing of the following 48 genes: APC, ATM, AXIN2, BARD1, BMPR1A, BRCA1, BRCA2, BRIP1, CDH1, CDK4, CDKN2A (p14ARF), CDKN2A (p16INK4a), CHEK2, CTNNA1, DICER1, EPCAM (Deletion/duplication testing only), GREM1 (promoter region deletion/duplication testing only), KIT, MEN1, MLH1, MSH2, MSH3, MSH6, MUTYH, NBN, NF1, NHTL1, PALB2, PDGFRA, PMS2, POLD1, POLE, PTEN, RAD50, RAD51C, RAD51D, RNF43, SDHB, SDHC, SDHD, SMAD4, SMARCA4. STK11, TP53, TSC1, TSC2, and VHL.  The following genes were evaluated for sequence changes only: SDHA and HOXB13 c.251G>A variant only.    11/03/2018 Surgery   Right lumpectomy ([facility]): IDC,  0.5cm, grade 3, clear margins, 2 axillary lymph nodes negative.      CHIEF COMPLIANT: Follow-up s/p lumpectomy to review pathology  INTERVAL HISTORY: [name] a [age].o. with above-mentioned history of triple negative right breast cancer. Genetic testing was negative. She underwent a lumpectomy on 11/03/18 with Dr. CoBrantley Stageor which pathology confirmed 0.5cm invasive ductal carcinoma, grade 3, clear margins, 2 axillary lymph nodes negative for carcinoma. She presents to the clinic today for follow-up to discuss her pathology report and further treatment.    REVIEW OF SYSTEMS:   Constitutional: Denies fevers, chills or abnormal weight loss Eyes: Denies blurriness of vision Ears, nose, mouth, throat, and face: Denies mucositis or sore throat Respiratory: Denies cough, dyspnea or wheezes Cardiovascular: Denies palpitation, chest discomfort Gastrointestinal: Denies nausea, heartburn or change in bowel habits Skin: Denies abnormal skin rashes Lymphatics: Denies new lymphadenopathy or easy bruising Neurological: Denies numbness, tingling or new weaknesses Behavioral/Psych: Mood is stable, no new changes  Extremities: No lower extremity edema Breast: s/p right lumpectomy All other systems were reviewed with the patient and are negative.  I have reviewed the past medical history, past surgical history, social history and family history with the patient and they are unchanged from previous note.  ALLERGIES:  has No Known Allergies.  MEDICATIONS:  Current Outpatient Medications  Medication Sig Dispense Refill  . Erenumab-aooe (AIMOVIG) 140 MG/ML SOAJ Inject 140 mg into the skin every 30 (thirty) days. 1.12 mg   . ibuprofen (ADVIL) 800 MG tablet Take 1 tablet (800 mg total) by mouth every 8 (eight) hours as needed. 30 tablet 0  . Multiple Vitamin (MULTIVITAMIN) tablet Take 1 tablet by mouth daily.    .Marland Kitchen  NUVARING 0.12-0.015 MG/24HR vaginal ring Place 1 each vaginally every 30 (thirty)  days.    Marland Kitchen oxyCODONE (OXY IR/ROXICODONE) 5 MG immediate release tablet Take 1 tablet (5 mg total) by mouth every 6 (six) hours as needed for severe pain. 15 tablet 0  . pantoprazole (PROTONIX) 40 MG tablet Take 1 tablet (40 mg total) by mouth daily. 90 tablet 1  . promethazine (PHENERGAN) 12.5 MG tablet Take 12.5 mg by mouth every 6 (six) hours as needed for nausea.    . rizatriptan (MAXALT) 10 MG tablet Take 10 mg by mouth as needed for migraine. May repeat in 2 hours if needed    . sertraline (ZOLOFT) 100 MG tablet Take 1.5 tablets (150 mg total) by mouth daily. PATIENT NEEDS OFFICE VISIT FOR ADDITIONAL REFILLS 45 tablet 0  . SUMAtriptan Succinate Refill 6 MG/0.5ML SOCT Inject 6 mg into the skin as needed. 15 mL   . topiramate (TOPAMAX) 100 MG tablet Take 100 mg by mouth daily.    . traMADol (ULTRAM) 50 MG tablet tramadol 50 mg tablet  Take 1 tablet every 6 hours by oral route.     No current facility-administered medications for this visit.     PHYSICAL EXAMINATION: ECOG PERFORMANCE STATUS: 1 - Symptomatic but completely ambulatory  Vitals:   11/10/18 0935  BP: (!) 143/83  Pulse: 98  Resp: 18  Temp: 98.3 F (36.8 C)  SpO2: 99%   Filed Weights   11/10/18 0935  Weight: 238 lb 11.2 oz (108.3 kg)    GENERAL: alert, no distress and comfortable SKIN: skin color, texture, turgor are normal, no rashes or significant lesions EYES: normal, Conjunctiva are pink and non-injected, sclera clear OROPHARYNX: no exudate, no erythema and lips, buccal mucosa, and tongue normal  NECK: supple, thyroid normal size, non-tender, without nodularity LYMPH: no palpable lymphadenopathy in the cervical, axillary or inguinal LUNGS: clear to auscultation and percussion with normal breathing effort HEART: regular rate & rhythm and no murmurs and no lower extremity edema ABDOMEN: abdomen soft, non-tender and normal bowel sounds MUSCULOSKELETAL: no cyanosis of digits and no clubbing  NEURO: alert &  oriented x 3 with fluent speech, no focal motor/sensory deficits EXTREMITIES: No lower extremity edema  LABORATORY DATA:  I have reviewed the data as listed CMP Latest Ref Rng & Units 11/01/2018  Glucose 70 - 99 mg/dL 106(H)  BUN 6 - 20 mg/dL 10  Creatinine 0.44 - 1.00 mg/dL 0.97  Sodium 135 - 145 mmol/L 139  Potassium 3.5 - 5.1 mmol/L 3.7  Chloride 98 - 111 mmol/L 109  CO2 22 - 32 mmol/L 20(L)  Calcium 8.9 - 10.3 mg/dL 9.0  Total Protein 6.5 - 8.1 g/dL 6.7  Total Bilirubin 0.3 - 1.2 mg/dL 0.2(L)  Alkaline Phos 38 - 126 U/L 86  AST 15 - 41 U/L 17  ALT 0 - 44 U/L 15    Lab Results  Component Value Date   WBC 7.7 11/01/2018   HGB 15.5 (H) 11/01/2018   HCT 47.4 (H) 11/01/2018   MCV 87.5 11/01/2018   PLT 248 11/01/2018   NEUTROABS 4.0 11/01/2018    ASSESSMENT & PLAN:  Malignant neoplasm of lower-outer quadrant of right breast of female, estrogen receptor negative ([place]) 10/06/2018:Screening mammogram detected right breast mass. Diagnostic mammogram showed 80m mass with adjacent 328mmass in the right breast with no axillary adenopathy. 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   Recommendations: 1. Breast conserving surgery 11/03/2018: IDC  0.5 cm, grade 3, 0/2 lymph nodes negative, triple negative, Ki-67 90% 2. 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 ---------------------------------------------------------------------------------------------------------------------------------------- 11/03/2018: Right lumpectomy Right lumpectomy ([facility]): IDC, 0.5cm, grade 3, clear margins, 2 axillary lymph nodes negative.  Triple negative with a Ki-67 of 90% Pathological stage: Stage Ib  Pathology counseling: I discussed the final pathology report of the patient provided  a copy of this report. I discussed the margins as well as lymph node surgeries. We also discussed the final staging  along with previously performed ER/PR and HER-2/neu testing.  Based on the final tumor size, I did not recommend systemic chemotherapy. Patient will undergo adjuvant radiation therapy followed by surveillance.     No orders of the defined types were placed in this encounter.  The patient has a good understanding of the overall plan. she agrees with it. she will call with any problems that may develop before the next visit here.  Nicholas Lose, MD 11/10/2018  Julious Oka Dorshimer am acting as scribe for Dr. Nicholas Lose.  I have reviewed the above documentation for accuracy and completeness, and I agree with the above.

## 2018-11-10 ENCOUNTER — Inpatient Hospital Stay: Payer: 59 | Attending: Genetic Counselor | Admitting: Hematology and Oncology

## 2018-11-10 ENCOUNTER — Other Ambulatory Visit: Payer: Self-pay

## 2018-11-10 DIAGNOSIS — Z171 Estrogen receptor negative status [ER-]: Secondary | ICD-10-CM | POA: Diagnosis not present

## 2018-11-10 DIAGNOSIS — C50511 Malignant neoplasm of lower-outer quadrant of right female breast: Secondary | ICD-10-CM | POA: Diagnosis present

## 2018-11-10 DIAGNOSIS — Z79899 Other long term (current) drug therapy: Secondary | ICD-10-CM | POA: Insufficient documentation

## 2018-11-10 DIAGNOSIS — Z923 Personal history of irradiation: Secondary | ICD-10-CM | POA: Diagnosis not present

## 2018-11-10 DIAGNOSIS — Z791 Long term (current) use of non-steroidal anti-inflammatories (NSAID): Secondary | ICD-10-CM | POA: Insufficient documentation

## 2018-11-10 DIAGNOSIS — Z793 Long term (current) use of hormonal contraceptives: Secondary | ICD-10-CM | POA: Diagnosis not present

## 2018-11-15 ENCOUNTER — Telehealth: Payer: Self-pay

## 2018-11-15 ENCOUNTER — Ambulatory Visit: Payer: Self-pay | Admitting: Surgery

## 2018-11-15 NOTE — Telephone Encounter (Signed)
RN returned call regarding port removal with Dr. Brantley Stage.    Left message notifying patient that we will reach out to Dr. Brantley Stage to ensure port removal is set up.

## 2018-11-20 ENCOUNTER — Ambulatory Visit: Payer: 59 | Admitting: Radiation Oncology

## 2018-11-23 ENCOUNTER — Ambulatory Visit: Payer: 59 | Admitting: Radiation Oncology

## 2018-11-24 ENCOUNTER — Telehealth: Payer: Self-pay | Admitting: Radiation Oncology

## 2018-11-25 ENCOUNTER — Ambulatory Visit
Admission: RE | Admit: 2018-11-25 | Discharge: 2018-11-25 | Disposition: A | Discharge status: Home / Self Care | Payer: 59 | Source: Ambulatory Visit | Attending: Radiation Oncology | Admitting: Radiation Oncology

## 2018-11-25 ENCOUNTER — Other Ambulatory Visit: Payer: Self-pay

## 2018-11-25 DIAGNOSIS — Z171 Estrogen receptor negative status [ER-]: Secondary | ICD-10-CM

## 2018-11-25 DIAGNOSIS — C50511 Malignant neoplasm of lower-outer quadrant of right female breast: Secondary | ICD-10-CM

## 2018-11-25 NOTE — Addendum Note (Signed)
Encounter addended by: Hayden Pedro, PA-C on: 11/25/2018 11:42 AM  Actions taken: Level of Service modified

## 2018-11-25 NOTE — Progress Notes (Signed)
Radiation Oncology         (336) [phone] ________________________________  Name: [name]        MRN: [id number]  Date of Service: 11/25/2018 DOB: 19-Dec-1975  CC:Vincent Gros, MD     REFERRING PHYSICIAN: Erroll Luna, MD   DIAGNOSIS: The encounter diagnosis was Malignant neoplasm of lower-outer quadrant of right breast of female, estrogen receptor negative ([place]).   HISTORY OF PRESENT ILLNESS: [name] is a 43 y.o. female with a recent diagnosis of right breast cancer. The patient was noted to have a screening detected abnormality in the right breast. Diagnostic imaging revealed an 8 mm and a 3 mm lesion in the breast and her axilla was negative for adenopathy. 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 Her tumor was triple negative with a Ki 67 of 90%. She has met with genetic counseling, but even with these results would plan breast conservation. She underwent lumpectomy and sentinel node biopsy on 11/03/2018. Final pathology revealed a 5 mm grade 3 invasive ductal carcinoma with negative margins and negative nodes. Her tumor was triple negative, but given the final size of her tumor, she does not require chemotherapy. She's scheduled for a PAC removal. She is seen today via MyChart to discuss adjuvant radiotherapy.   PREVIOUS RADIATION THERAPY: No   PAST MEDICAL HISTORY:  Past Medical History:  Diagnosis Date  . Allergy   . Depression   . Family history of cervical cancer   . Family history of melanoma   . Family history of prostate cancer   . GERD (gastroesophageal reflux disease)   . Migraine        PAST SURGICAL HISTORY: Past Surgical History:  Procedure Laterality Date  . ADENOIDECTOMY    . APPENDECTOMY    . BREAST LUMPECTOMY WITH RADIOACTIVE SEED AND SENTINEL LYMPH NODE BIOPSY Right 11/03/2018   Procedure: RIGHT BREAST LUMPECTOMY WITH RADIOACTIVE SEED AND RIGHT SENTINEL LYMPH NODE  MAPPING;  Surgeon: Erroll Luna, MD;  Location: [place];  Service: General;  Laterality: Right;  . CESAREAN SECTION    . OPERATIVE ULTRASOUND Right 11/03/2018   Procedure: OPERATIVE ULTRASOUND;  Surgeon: Erroll Luna, MD;  Location: [place];  Service: General;  Laterality: Right;  . PORTACATH PLACEMENT Right 11/03/2018   Procedure: INSERTION PORT-A-CATH;  Surgeon: Erroll Luna, MD;  Location: [place];  Service: General;  Laterality: Right;     FAMILY HISTORY:  Family History  Problem Relation Age of Onset  . Hypertension Father   . Hypertension Mother   . Heart failure Maternal Grandmother   . Emphysema Maternal Grandmother   . Diabetes Maternal Grandfather   . Parkinson's disease Paternal Grandmother   . Heart disease Paternal Grandfather   . Prostate cancer Paternal Grandfather 63       not metastatic  . Melanoma Maternal Aunt 58       sun exposure  . Cervical cancer Maternal Great-grandmother   . Breast cancer Neg Hx      SOCIAL HISTORY:  reports that she has never smoked. She has never used smokeless tobacco. She reports current alcohol use. She reports that she does not use drugs. The patient is married and lives in [place]. She has three children. 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    ALLERGIES: Patient has no known allergies.   MEDICATIONS:  Current Outpatient Medications  Medication Sig Dispense Refill  . Erenumab-aooe (AIMOVIG) 140 MG/ML SOAJ Inject 140 mg into the skin every 30 (thirty) days. 1.12 mg   . ibuprofen (ADVIL) 800 MG tablet Take 1 tablet (800 mg total) by mouth every 8 (eight) hours as needed. 30 tablet 0  . Multiple Vitamin (MULTIVITAMIN) tablet Take 1 tablet by mouth daily.    Marland Kitchen NUVARING 0.12-0.015 MG/24HR vaginal ring Place 1 each vaginally every 30 (thirty) days.    Marland Kitchen oxyCODONE (OXY IR/ROXICODONE) 5 MG immediate release tablet Take  1 tablet (5 mg total) by mouth every 6 (six) hours as needed for severe pain. (Patient not taking: Reported on 11/25/2018) 15 tablet 0  . pantoprazole (PROTONIX) 40 MG tablet Take 1 tablet (40 mg total) by mouth daily. 90 tablet 1  . promethazine (PHENERGAN) 12.5 MG tablet Take 12.5 mg by mouth every 6 (six) hours as needed for nausea.    . rizatriptan (MAXALT) 10 MG tablet Take 10 mg by mouth as needed for migraine. May repeat in 2 hours if needed    . sertraline (ZOLOFT) 100 MG tablet Take 1.5 tablets (150 mg total) by mouth daily. PATIENT NEEDS OFFICE VISIT FOR ADDITIONAL REFILLS 45 tablet 0  . SUMAtriptan Succinate Refill 6 MG/0.5ML SOCT Inject 6 mg into the skin as needed. 15 mL   . topiramate (TOPAMAX) 100 MG tablet Take 100 mg by mouth daily.    . traMADol (ULTRAM) 50 MG tablet tramadol 50 mg tablet  Take 1 tablet every 6 hours by oral route.     No current facility-administered medications for this encounter.      REVIEW OF SYSTEMS: On review of systems, the patient reports that she is doing well overall. She does have some persistent healing at her lumpectomy site and mild drainage from this that is clear. She denies any chest pain, shortness of breath, cough, fevers, chills, night sweats, unintended weight changes. She denies any bowel or bladder disturbances, and denies abdominal pain, nausea or vomiting. She denies any new musculoskeletal or joint aches or pains. A complete review of systems is obtained and is otherwise negative.     PHYSICAL EXAM:  Wt Readings from Last 3 Encounters:  11/10/18 238 lb 11.2 oz (108.3 kg)  11/03/18 238 lb 8.6 oz (108.2 kg)  10/12/18 239 lb (108.4 kg)   Temp Readings from Last 3 Encounters:  11/10/18 98.3 F (36.8 C) (Oral)  11/03/18 98.4 F (36.9 C)  10/11/18 98.7 F (37.1 C) (Oral)   BP Readings from Last 3 Encounters:  11/10/18 (!) 143/83  11/03/18 131/81  10/11/18 (!) 142/90   Pulse Readings from Last 3 Encounters:  11/10/18 98   11/03/18 100  10/11/18 97    In general this is a well appearing caucasian female in no acute distress. She's alert and oriented x4 and appropriate throughout the examination. Cardiopulmonary assessment is negative for acute distress and she exhibits normal effort. Breast exam is deferred.   ECOG = 0  0 - Asymptomatic (Fully active, able to carry on all predisease activities without restriction)  1 - Symptomatic but completely ambulatory (Restricted in physically strenuous activity but ambulatory and able to carry out work of a light or sedentary nature. For example, light housework, office work)  2 - Symptomatic, <50% in bed during the day (Ambulatory and capable of all self care but unable to carry out any work activities. Up and about more than 50% of waking hours)  3 - Symptomatic, >  50% in bed, but not bedbound (Capable of only limited self-care, confined to bed or chair 50% or more of waking hours)  4 - Bedbound (Completely disabled. Cannot carry on any self-care. Totally confined to bed or chair)  5 - Death   Eustace Pen MM, Creech RH, Tormey DC, et al. [phone]). "Toxicity and response criteria of the [facility]". [place] Oncol. 5 (6): 649-55    LABORATORY DATA:  Lab Results  Component Value Date   WBC 7.7 11/01/2018   HGB 15.5 (H) 11/01/2018   HCT 47.4 (H) 11/01/2018   MCV 87.5 11/01/2018   PLT 248 11/01/2018   Lab Results  Component Value Date   NA 139 11/01/2018   K 3.7 11/01/2018   CL 109 11/01/2018   CO2 20 (L) 11/01/2018   Lab Results  Component Value Date   ALT 15 11/01/2018   AST 17 11/01/2018   ALKPHOS 86 11/01/2018   BILITOT 0.2 (L) 11/01/2018      RADIOGRAPHY: Nm Sentinel Node Inj-no Rpt (breast)  Result Date: 11/03/2018 Sulfur colloid was injected by the nuclear medicine technologist for melanoma sentinel node.   Mm Breast Surgical Specimen  Result Date: 11/03/2018 CLINICAL DATA:  Status post seed localized lumpectomy of the  RIGHT breast for grade 3 invasive ductal carcinoma with ductal carcinoma in situ. EXAM: SPECIMEN RADIOGRAPH OF THE RIGHT BREAST COMPARISON:  Previous exam(s). FINDINGS: Status post excision of the right breast. The radioactive seed and biopsy marker clip are present, completely intact, and were marked for pathology. IMPRESSION: Specimen radiograph of the right breast. Electronically Signed   By: Nolon Nations M.D.   On: 11/03/2018 11:58   Dg Chest Port 1 View  Result Date: 11/03/2018 CLINICAL DATA:  Postop Port-A-Cath EXAM: PORTABLE CHEST 1 VIEW COMPARISON:  None. FINDINGS: There is interval placement of a right-sided MediPort catheter with the tip at the lower SVC. No pneumothorax. The cardiomediastinal silhouette is unremarkable. The lungs are clear. IMPRESSION: Right-sided MediPort catheter with the tip at the lower SVC. Electronically Signed   By: Prudencio Pair M.D.   On: 11/03/2018 13:25   Dg Fluoro Guide Cv Line-no Report  Result Date: 11/03/2018 Fluoroscopy was utilized by the requesting physician.  No radiographic interpretation.   Mm Rt Radioactive Seed Loc Mammo Guide  Result Date: 11/02/2018 CLINICAL DATA:  The patient presents for radioactive seed localization of a lesion within the LOWER OUTER QUADRANT of the RIGHT breast. Recent ultrasound-guided core biopsy a lesion in the 6:30 o'clock location shows grade 3 invasive ductal carcinoma with ductal carcinoma in situ. EXAM: MAMMOGRAPHIC GUIDED RADIOACTIVE SEED LOCALIZATION OF THE RIGHT BREAST COMPARISON:  Previous exam(s). FINDINGS: Patient presents for radioactive seed localization prior to lumpectomy. I met with the patient and we discussed the procedure of seed localization including benefits and alternatives. We discussed the high likelihood of a successful procedure. We discussed the risks of the procedure including infection, bleeding, tissue injury and further surgery. We discussed the low dose of radioactivity involved in the procedure.  Informed, written consent was given. The usual time-out protocol was performed immediately prior to the procedure. Using mammographic guidance, sterile technique, 1% lidocaine and an I-125 radioactive seed, the heart shaped clip in the LOWER OUTER QUADRANT of the RIGHT breast was localized using a LATERAL to MEDIAL approach. The follow-up mammogram images confirm the seed in the expected location and were marked for Dr. Brantley Stage. Follow-up survey of the patient confirms presence of the radioactive seed. Order number  of I-125 seed:  [id number]. Total activity:  3.142 millicuries reference Date: 10/22/2018 The patient tolerated the procedure well and was released from the [facility]. She was given instructions regarding seed removal. IMPRESSION: Radioactive seed localization right breast. No apparent complications. Electronically Signed   By: Nolon Nations M.D.   On: 11/02/2018 15:00       IMPRESSION/PLAN: 1. 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 Dr. Lisbeth Renshaw discusses the pathology findings and reviews the nature of triple negative breast disease. She fortunately does not need systemic therapy and is anxious to have her PAC removed.  We discussed the risks, benefits, short, and long term effects of radiotherapy, and the patient is interested in proceeding. Dr. Lisbeth Renshaw discusses the delivery and logistics of radiotherapy and anticipates a course of 6 1/2 weeks of radiotherapy. We will see her back for simulation on 12/21/2018 instead of today to allow for further healing of her breast and following PAC removal. She will sign written consent that day to proceed. 2. Contraception. The patient is currently using Nuvaring. She is willing to do a home pregnancy test and report these results to [place] on her simulation date. She will continue her current contraception. 3. Central IV Access. She will have her PAC removed and we will proceed a few weeks after her procedure  to remove this to allow for healing.  This encounter was provided by telemedicine platform MyChart.  The patient has given verbal consent for this type of encounter and has been advised to only accept a meeting of this type in a secure network environment. The time spent during this encounter was 15 minutes. The attendants for this meeting include Mardene Sayer, LPN, Dr. Lisbeth Renshaw, Hayden Pedro  and Massie Bougie.  During the encounter,   Mardene Sayer, LPN, Dr. Lisbeth Renshaw, and Hayden Pedro were located at [facility] Radiation Oncology Department.  Danyale [place] OBORN was located at home.    The above documentation reflects my direct findings during this shared patient visit. Please see the separate note by Dr. Lisbeth Renshaw on this date for the remainder of the patient's plan of care.    Carola Rhine, PAC

## 2018-11-25 NOTE — Addendum Note (Signed)
Encounter addended by: Kyung Rudd, MD on: 11/25/2018 10:50 AM  Actions taken: External Videoconference Connected, External Videoconference Disconnected

## 2018-11-29 ENCOUNTER — Encounter: Payer: Self-pay | Admitting: *Deleted

## 2018-11-30 ENCOUNTER — Encounter (HOSPITAL_BASED_OUTPATIENT_CLINIC_OR_DEPARTMENT_OTHER): Payer: Self-pay | Admitting: *Deleted

## 2018-11-30 ENCOUNTER — Other Ambulatory Visit: Payer: Self-pay

## 2018-12-01 ENCOUNTER — Telehealth: Payer: Self-pay | Admitting: Hematology and Oncology

## 2018-12-01 NOTE — Telephone Encounter (Signed)
I talk with patient regarding 11/9

## 2018-12-02 NOTE — Progress Notes (Signed)

## 2018-12-07 ENCOUNTER — Encounter: Payer: Self-pay | Admitting: Radiation Oncology

## 2018-12-07 ENCOUNTER — Other Ambulatory Visit (HOSPITAL_COMMUNITY)
Admission: RE | Admit: 2018-12-07 | Discharge: 2018-12-07 | Disposition: A | Discharge status: Home / Self Care | Payer: 59 | Source: Ambulatory Visit | Attending: Surgery | Admitting: Surgery

## 2018-12-07 DIAGNOSIS — Z01812 Encounter for preprocedural laboratory examination: Secondary | ICD-10-CM | POA: Diagnosis not present

## 2018-12-07 DIAGNOSIS — Z20828 Contact with and (suspected) exposure to other viral communicable diseases: Secondary | ICD-10-CM | POA: Insufficient documentation

## 2018-12-07 LAB — SARS CORONAVIRUS 2 (TAT 6-24 HRS): SARS Coronavirus 2: NEGATIVE

## 2018-12-09 ENCOUNTER — Other Ambulatory Visit: Payer: Self-pay

## 2018-12-09 ENCOUNTER — Encounter (HOSPITAL_BASED_OUTPATIENT_CLINIC_OR_DEPARTMENT_OTHER): Payer: Self-pay | Admitting: *Deleted

## 2018-12-09 ENCOUNTER — Ambulatory Visit (HOSPITAL_BASED_OUTPATIENT_CLINIC_OR_DEPARTMENT_OTHER): Payer: 59 | Admitting: Certified Registered"

## 2018-12-09 ENCOUNTER — Encounter (HOSPITAL_BASED_OUTPATIENT_CLINIC_OR_DEPARTMENT_OTHER)
Admission: RE | Disposition: A | Discharge status: Home / Self Care | Payer: Self-pay | Source: Home / Self Care | Attending: Surgery

## 2018-12-09 ENCOUNTER — Ambulatory Visit (HOSPITAL_BASED_OUTPATIENT_CLINIC_OR_DEPARTMENT_OTHER)
Admission: RE | Admit: 2018-12-09 | Discharge: 2018-12-09 | Disposition: A | Discharge status: Home / Self Care | Payer: 59 | Attending: Surgery | Admitting: Surgery

## 2018-12-09 DIAGNOSIS — Z8049 Family history of malignant neoplasm of other genital organs: Secondary | ICD-10-CM | POA: Diagnosis not present

## 2018-12-09 DIAGNOSIS — Z79899 Other long term (current) drug therapy: Secondary | ICD-10-CM | POA: Diagnosis not present

## 2018-12-09 DIAGNOSIS — Z6841 Body Mass Index (BMI) 40.0 and over, adult: Secondary | ICD-10-CM | POA: Insufficient documentation

## 2018-12-09 DIAGNOSIS — Z853 Personal history of malignant neoplasm of breast: Secondary | ICD-10-CM | POA: Insufficient documentation

## 2018-12-09 DIAGNOSIS — Z825 Family history of asthma and other chronic lower respiratory diseases: Secondary | ICD-10-CM | POA: Diagnosis not present

## 2018-12-09 DIAGNOSIS — Z8042 Family history of malignant neoplasm of prostate: Secondary | ICD-10-CM | POA: Diagnosis not present

## 2018-12-09 DIAGNOSIS — Z791 Long term (current) use of non-steroidal anti-inflammatories (NSAID): Secondary | ICD-10-CM | POA: Insufficient documentation

## 2018-12-09 DIAGNOSIS — Z808 Family history of malignant neoplasm of other organs or systems: Secondary | ICD-10-CM | POA: Diagnosis not present

## 2018-12-09 DIAGNOSIS — Z793 Long term (current) use of hormonal contraceptives: Secondary | ICD-10-CM | POA: Insufficient documentation

## 2018-12-09 DIAGNOSIS — Z820 Family history of epilepsy and other diseases of the nervous system: Secondary | ICD-10-CM | POA: Insufficient documentation

## 2018-12-09 DIAGNOSIS — Z8249 Family history of ischemic heart disease and other diseases of the circulatory system: Secondary | ICD-10-CM | POA: Diagnosis not present

## 2018-12-09 DIAGNOSIS — Z452 Encounter for adjustment and management of vascular access device: Secondary | ICD-10-CM | POA: Diagnosis not present

## 2018-12-09 DIAGNOSIS — F329 Major depressive disorder, single episode, unspecified: Secondary | ICD-10-CM | POA: Diagnosis not present

## 2018-12-09 DIAGNOSIS — K219 Gastro-esophageal reflux disease without esophagitis: Secondary | ICD-10-CM | POA: Diagnosis not present

## 2018-12-09 DIAGNOSIS — G43909 Migraine, unspecified, not intractable, without status migrainosus: Secondary | ICD-10-CM | POA: Insufficient documentation

## 2018-12-09 DIAGNOSIS — Z833 Family history of diabetes mellitus: Secondary | ICD-10-CM | POA: Insufficient documentation

## 2018-12-09 HISTORY — PX: PORT-A-CATH REMOVAL: SHX5289

## 2018-12-09 LAB — POCT PREGNANCY, URINE: Preg Test, Ur: NEGATIVE

## 2018-12-09 SURGERY — REMOVAL PORT-A-CATH
Anesthesia: Monitor Anesthesia Care

## 2018-12-09 MED ORDER — CHLORHEXIDINE GLUCONATE CLOTH 2 % EX PADS: 6.0000 | MEDICATED_PAD | Freq: Once | CUTANEOUS | Status: DC

## 2018-12-09 MED ORDER — MIDAZOLAM HCL 2 MG/2ML IJ SOLN
1.0000 mg | INTRAMUSCULAR | Status: DC | PRN
  Administered 2018-12-09: 2 mg via INTRAVENOUS

## 2018-12-09 MED ORDER — ONDANSETRON HCL 4 MG/2ML IJ SOLN
INTRAMUSCULAR | Status: AC
  Filled 2018-12-09: qty 2

## 2018-12-09 MED ORDER — BUPIVACAINE-EPINEPHRINE 0.25% -1:200000 IJ SOLN
INTRAMUSCULAR | Status: DC | PRN
  Administered 2018-12-09: 10 mL

## 2018-12-09 MED ORDER — FENTANYL CITRATE (PF) 100 MCG/2ML IJ SOLN
INTRAMUSCULAR | Status: AC
  Filled 2018-12-09: qty 2

## 2018-12-09 MED ORDER — FENTANYL CITRATE (PF) 100 MCG/2ML IJ SOLN
50.0000 ug | INTRAMUSCULAR | Status: DC | PRN
  Administered 2018-12-09 (×2): 50 ug via INTRAVENOUS

## 2018-12-09 MED ORDER — LACTATED RINGERS IV SOLN
INTRAVENOUS | Status: DC
  Administered 2018-12-09 (×2): via INTRAVENOUS

## 2018-12-09 MED ORDER — MIDAZOLAM HCL 2 MG/2ML IJ SOLN
INTRAMUSCULAR | Status: AC
  Filled 2018-12-09: qty 2

## 2018-12-09 MED ORDER — ONDANSETRON HCL 4 MG/2ML IJ SOLN
INTRAMUSCULAR | Status: DC | PRN
  Administered 2018-12-09: 4 mg via INTRAVENOUS

## 2018-12-09 MED ORDER — CEFAZOLIN SODIUM-DEXTROSE 2-4 GM/100ML-% IV SOLN
INTRAVENOUS | Status: AC
  Filled 2018-12-09: qty 100

## 2018-12-09 MED ORDER — PROPOFOL 500 MG/50ML IV EMUL
INTRAVENOUS | Status: DC | PRN
  Administered 2018-12-09: 75 ug/kg/min via INTRAVENOUS

## 2018-12-09 MED ORDER — PROPOFOL 500 MG/50ML IV EMUL
INTRAVENOUS | Status: AC
  Filled 2018-12-09: qty 50

## 2018-12-09 MED ORDER — DEXTROSE 5 % IV SOLN: 3.0000 g | INTRAVENOUS | Status: DC

## 2018-12-09 SURGICAL SUPPLY — 31 items
BENZOIN TINCTURE PRP APPL 2/3 (GAUZE/BANDAGES/DRESSINGS) IMPLANT
BLADE SURG 15 STRL LF DISP TIS (BLADE) ×1 IMPLANT
BLADE SURG 15 STRL SS (BLADE) ×2
CHLORAPREP W/TINT 26 (MISCELLANEOUS) ×3 IMPLANT
CLOSURE WOUND 1/2 X4 (GAUZE/BANDAGES/DRESSINGS)
COVER BACK TABLE REUSABLE LG (DRAPES) ×3 IMPLANT
COVER MAYO STAND REUSABLE (DRAPES) ×3 IMPLANT
COVER WAND RF STERILE (DRAPES) IMPLANT
DECANTER SPIKE VIAL GLASS SM (MISCELLANEOUS) ×3 IMPLANT
DERMABOND ADVANCED (GAUZE/BANDAGES/DRESSINGS) ×2
DERMABOND ADVANCED .7 DNX12 (GAUZE/BANDAGES/DRESSINGS) ×1 IMPLANT
DRAPE LAPAROTOMY 100X72 PEDS (DRAPES) ×3 IMPLANT
DRAPE UTILITY XL STRL (DRAPES) ×3 IMPLANT
ELECT REM PT RETURN 9FT ADLT (ELECTROSURGICAL) ×3
ELECTRODE REM PT RTRN 9FT ADLT (ELECTROSURGICAL) ×1 IMPLANT
GLOVE BIOGEL PI IND STRL 8 (GLOVE) ×1 IMPLANT
GLOVE BIOGEL PI INDICATOR 8 (GLOVE) ×2
GLOVE ECLIPSE 8.0 STRL XLNG CF (GLOVE) ×3 IMPLANT
GOWN STRL REUS W/ TWL LRG LVL3 (GOWN DISPOSABLE) ×2 IMPLANT
GOWN STRL REUS W/TWL LRG LVL3 (GOWN DISPOSABLE) ×4
NEEDLE HYPO 25X1 1.5 SAFETY (NEEDLE) ×3 IMPLANT
NS IRRIG 1000ML POUR BTL (IV SOLUTION) ×3 IMPLANT
PACK BASIN DAY SURGERY FS (CUSTOM PROCEDURE TRAY) ×3 IMPLANT
PENCIL BUTTON HOLSTER BLD 10FT (ELECTRODE) IMPLANT
SLEEVE SCD COMPRESS KNEE MED (MISCELLANEOUS) IMPLANT
SPONGE LAP 4X18 RFD (DISPOSABLE) ×3 IMPLANT
STRIP CLOSURE SKIN 1/2X4 (GAUZE/BANDAGES/DRESSINGS) IMPLANT
SUT MON AB 4-0 PC3 18 (SUTURE) ×3 IMPLANT
SUT VICRYL 3-0 CR8 SH (SUTURE) ×3 IMPLANT
SYR CONTROL 10ML LL (SYRINGE) ×3 IMPLANT
TOWEL GREEN STERILE FF (TOWEL DISPOSABLE) ×3 IMPLANT

## 2018-12-09 NOTE — Op Note (Signed)
Preop diagnosis: Indwelling port a catheter for chemotherapy  Postop diagnosis: Same  Procedure: Removal of port a catheter  Surgeon: Frona Yost M.D.  Anesthesia: MAC with local  EBL: Minimal  Specimen none  Drains: None  Indications for procedure: The patient presents for removal of port a catheter after completing chemotherapy. The patient no longer requires central venous access. Risks of bleeding, infection, catheter fragmentation, embolization, arrhythmias and damage to arteries, veins and nerves and possibly other mediastinal structures discussed. The patient agrees to proceed.  Description of procedure: The patient was seen in the holding area. Questions were answered. The patient agreed to proceed. The patient was taken to the operating room. The patient was placed supine. Anesthesia was initiated. The skin on the upper chest was prepped and draped in a sterile fashion. Timeout was done. The patient received preoperative antibiotics. Incision was made through the old port site and the hub of the Port-A-Cath was seen. The sutures were cut to release the port from the chest wall. The catheter was removed in its entirety without difficulty. The tract was closed with 3-0 Vicryl. 4 Monocryl was used to close the skin. All final counts were correct. The patient was taken to recovery in satisfactory condition.  

## 2018-12-09 NOTE — H&P (Signed)
[name] is an 43 y.o. female.   Chief Complaint: port in place HPI: pt presents for port removal today.  She has no complaints   Past Medical History:  Diagnosis Date  . Allergy   . Depression   . Family history of cervical cancer   . Family history of melanoma   . Family history of prostate cancer   . GERD (gastroesophageal reflux disease)   . Migraine     Past Surgical History:  Procedure Laterality Date  . ADENOIDECTOMY    . APPENDECTOMY    . BREAST LUMPECTOMY WITH RADIOACTIVE SEED AND SENTINEL LYMPH NODE BIOPSY Right 11/03/2018   Procedure: RIGHT BREAST LUMPECTOMY WITH RADIOACTIVE SEED AND RIGHT SENTINEL LYMPH NODE MAPPING;  Surgeon: Erroll Luna, MD;  Location: [facility];  Service: General;  Laterality: Right;  . CESAREAN SECTION    . OPERATIVE ULTRASOUND Right 11/03/2018   Procedure: OPERATIVE ULTRASOUND;  Surgeon: Erroll Luna, MD;  Location: [facility];  Service: General;  Laterality: Right;  . PORTACATH PLACEMENT Right 11/03/2018   Procedure: INSERTION PORT-A-CATH;  Surgeon: Erroll Luna, MD;  Location: [facility];  Service: General;  Laterality: Right;    Family History  Problem Relation Age of Onset  . Hypertension Father   . Hypertension Mother   . Heart failure Maternal Grandmother   . Emphysema Maternal Grandmother   . Diabetes Maternal Grandfather   . Parkinson's disease Paternal Grandmother   . Heart disease Paternal Grandfather   . Prostate cancer Paternal Grandfather 86       not metastatic  . Melanoma Maternal Aunt 58       sun exposure  . Cervical cancer Maternal Great-grandmother   . Breast cancer Neg Hx    Social History:  reports that she has never smoked. She has never used smokeless tobacco. She reports current alcohol use. She reports that she does not use drugs.  Allergies: No Known Allergies  Medications Prior to Admission  Medication Sig Dispense Refill  . Erenumab-aooe (AIMOVIG)  140 MG/ML SOAJ Inject 140 mg into the skin every 30 (thirty) days. 1.12 mg   . NUVARING 0.12-0.015 MG/24HR vaginal ring Place 1 each vaginally every 30 (thirty) days.    . pantoprazole (PROTONIX) 40 MG tablet Take 1 tablet (40 mg total) by mouth daily. 90 tablet 1  . promethazine (PHENERGAN) 12.5 MG tablet Take 12.5 mg by mouth every 6 (six) hours as needed for nausea.    . rizatriptan (MAXALT) 10 MG tablet Take 10 mg by mouth as needed for migraine. May repeat in 2 hours if needed    . sertraline (ZOLOFT) 100 MG tablet Take 1.5 tablets (150 mg total) by mouth daily. PATIENT NEEDS OFFICE VISIT FOR ADDITIONAL REFILLS 45 tablet 0  . topiramate (TOPAMAX) 100 MG tablet Take 100 mg by mouth daily.    Marland Kitchen ibuprofen (ADVIL) 800 MG tablet Take 1 tablet (800 mg total) by mouth every 8 (eight) hours as needed. 30 tablet 0  . SUMAtriptan Succinate Refill 6 MG/0.5ML SOCT Inject 6 mg into the skin as needed. 15 mL   . traMADol (ULTRAM) 50 MG tablet tramadol 50 mg tablet  Take 1 tablet every 6 hours by oral route.      Results for orders placed or performed during the hospital encounter of 12/09/18 (from the past 48 hour(s))  Pregnancy, urine POC     Status: None   Collection Time: 12/09/18 10:13 AM  Result Value Ref Range  Preg Test, Ur NEGATIVE NEGATIVE    Comment:        THE SENSITIVITY OF THIS METHODOLOGY IS >24 mIU/mL    No results found.  Review of Systems  All other systems reviewed and are negative.   Pulse 89, temperature (!) 97.5 F (36.4 C), temperature source Oral, resp. rate 20, height 5\' 3"  (1.6 m), weight 106.4 kg, last menstrual period 08/09/2018, SpO2 99 %. Physical Exam  Constitutional: She appears well-developed.  HENT:  Head: Normocephalic.  Neck: Normal range of motion.  Respiratory:  Incisions healing well to breasts  Port site intact right chest   Musculoskeletal: Normal range of motion.  Neurological: She is alert.  Skin: Skin is warm.     Assessment/Plan Port in  place  Remove port   The procedure has been discussed with the patient.  Alternative therapies have been discussed with the patient.  Operative risks include bleeding,  Infection,  Organ injury,  Nerve injury,  Blood vessel injury,  DVT,  Pulmonary embolism,  Death,  And possible reoperation.  Medical management risks include worsening of present situation.  The success of the procedure is 50 -90 % at treating patients symptoms.  The patient understands and agrees to proceed.  Turner Daniels, MD 12/09/2018, 10:40 AM

## 2018-12-09 NOTE — Anesthesia Preprocedure Evaluation (Signed)
Anesthesia Evaluation  Patient identified by MRN, date of birth, ID band Patient awake    Reviewed: Allergy & Precautions, NPO status , Patient's Chart, lab work & pertinent test results  Airway Mallampati: III  TM Distance: <3 FB Neck ROM: Full    Dental no notable dental hx.    Pulmonary neg pulmonary ROS,    Pulmonary exam normal breath sounds clear to auscultation       Cardiovascular negative cardio ROS Normal cardiovascular exam Rhythm:Regular Rate:Normal     Neuro/Psych negative neurological ROS  negative psych ROS   GI/Hepatic Neg liver ROS, GERD  ,  Endo/Other  Morbid obesity  Renal/GU negative Renal ROS  negative genitourinary   Musculoskeletal negative musculoskeletal ROS (+)   Abdominal   Peds negative pediatric ROS (+)  Hematology negative hematology ROS (+)   Anesthesia Other Findings   Reproductive/Obstetrics negative OB ROS                             Anesthesia Physical Anesthesia Plan  ASA: III  Anesthesia Plan: MAC   Post-op Pain Management:    Induction: Intravenous  PONV Risk Score and Plan: 2 and Ondansetron, Dexamethasone and Treatment may vary due to age or medical condition  Airway Management Planned: Simple Face Mask  Additional Equipment:   Intra-op Plan:   Post-operative Plan:   Informed Consent: I have reviewed the patients History and Physical, chart, labs and discussed the procedure including the risks, benefits and alternatives for the proposed anesthesia with the patient or authorized representative who has indicated his/her understanding and acceptance.     Dental advisory given  Plan Discussed with: CRNA and Surgeon  Anesthesia Plan Comments:         Anesthesia Quick Evaluation

## 2018-12-09 NOTE — Interval H&P Note (Signed)
History and Physical Interval Note:  12/09/2018 10:42 AM  [name]  has presented today for surgery, with the diagnosis of PORT.  The various methods of treatment have been discussed with the patient and family. After consideration of risks, benefits and other options for treatment, the patient has consented to  Procedure(s): PORT REMOVAL (N/A) as a surgical intervention.  The patient's history has been reviewed, patient examined, no change in status, stable for surgery.  I have reviewed the patient's chart and labs.  Questions were answered to the patient's satisfaction.     [place]

## 2018-12-09 NOTE — Transfer of Care (Signed)
Immediate Anesthesia Transfer of Care Note  Patient: [name]  Procedure(s) Performed: PORT REMOVAL (N/A )  Patient Location: PACU  Anesthesia Type:General  Level of Consciousness: awake, alert  and oriented  Airway & Oxygen Therapy: Patient Spontanous Breathing and Patient connected to face mask oxygen  Post-op Assessment: Report given to RN and Post -op Vital signs reviewed and stable  Post vital signs: Reviewed and stable  Last Vitals:  Vitals Value Taken Time  BP    Temp    Pulse 80 12/09/18 1134  Resp    SpO2 99 % 12/09/18 1134  Vitals shown include unvalidated device data.  Last Pain:  Vitals:   12/09/18 1027  TempSrc: Oral  PainSc: 2       Patients Stated Pain Goal: 3 ([id number])  Complications: No apparent anesthesia complications

## 2018-12-09 NOTE — Discharge Instructions (Signed)
GENERAL SURGERY: POST OP INSTRUCTIONS  ######################################################################  EAT Gradually transition to a high fiber diet with a fiber supplement over the next few weeks after discharge.  Start with a pureed / full liquid diet (see below)  WALK Walk an hour a day.  Control your pain to do that.    CONTROL PAIN Control pain so that you can walk, sleep, tolerate sneezing/coughing, go up/down stairs.  HAVE A BOWEL MOVEMENT DAILY Keep your bowels regular to avoid problems.  OK to try a laxative to override constipation.  OK to use an antidairrheal to slow down diarrhea.  Call if not better after 2 tries  CALL IF YOU HAVE PROBLEMS/CONCERNS Call if you are still struggling despite following these instructions. Call if you have concerns not answered by these instructions  ######################################################################    1. DIET: Follow a light bland diet & liquids the first 24 hours after arrival home, such as soup, liquids, starches, etc.  Be sure to drink plenty of fluids.  Quickly advance to a usual solid diet within a few days.  Avoid fast food or heavy meals as your are more likely to get nauseated or have irregular bowels.  A low-fat, high-fiber diet for the rest of your life is ideal.   2. Take your usually prescribed home medications unless otherwise directed. 3. PAIN CONTROL: a. Pain is best controlled by a usual combination of three different methods TOGETHER: i. Ice/Heat ii. Over the counter pain medication iii. Prescription pain medication b. Most patients will experience some swelling and bruising around the incisions.  Ice packs or heating pads (30-60 minutes up to 6 times a day) will help. Use ice for the first few days to help decrease swelling and bruising, then switch to heat to help relax tight/sore spots and speed recovery.  Some people prefer to use ice alone, heat alone, alternating between ice & heat.   Experiment to what works for you.  Swelling and bruising can take several weeks to resolve.   c. It is helpful to take an over-the-counter pain medication regularly for the first few weeks.  Choose one of the following that works best for you: i. Naproxen (Aleve, etc)  Two 220mg tabs twice a day ii. Ibuprofen (Advil, etc) Three 200mg tabs four times a day (every meal & bedtime) iii. Acetaminophen (Tylenol, etc) 500-650mg four times a day (every meal & bedtime) d. A  prescription for pain medication (such as oxycodone, hydrocodone, etc) should be given to you upon discharge.  Take your pain medication as prescribed.  i. If you are having problems/concerns with the prescription medicine (does not control pain, nausea, vomiting, rash, itching, etc), please call us [phone] to see if we need to switch you to a different pain medicine that will work better for you and/or control your side effect better. ii. If you need a refill on your pain medication, please contact your pharmacy.  They will contact our office to request authorization. Prescriptions will not be filled after 5 pm or on week-ends. 4. Avoid getting constipated.  Between the surgery and the pain medications, it is common to experience some constipation.  Increasing fluid intake and taking a fiber supplement (such as Metamucil, Citrucel, FiberCon, MiraLax, etc) 1-2 times a day regularly will usually help prevent this problem from occurring.  A mild laxative (prune juice, Milk of Magnesia, MiraLax, etc) should be taken according to package directions if there are no bowel movements after 48 hours.   5. Wash /   shower every day.  You may shower over the dressings as they are waterproof.  Continue to shower over incision(s) after the dressing is off. 6. Remove your waterproof bandages 5 days after surgery.  You may leave the incision open to air.  You may have skin tapes (Steri Strips) covering the incision(s).  Leave them on until one week, then  remove.  You may replace a dressing/Band-Aid to cover the incision for comfort if you wish.      7. ACTIVITIES as tolerated:   a. You may resume regular (light) daily activities beginning the next day--such as daily self-care, walking, climbing stairs--gradually increasing activities as tolerated.  If you can walk 30 minutes without difficulty, it is safe to try more intense activity such as jogging, treadmill, bicycling, low-impact aerobics, swimming, etc. b. Save the most intensive and strenuous activity for last such as sit-ups, heavy lifting, contact sports, etc  Refrain from any heavy lifting or straining until you are off narcotics for pain control.   c. DO NOT PUSH THROUGH PAIN.  Let pain be your guide: If it hurts to do something, don't do it.  Pain is your body warning you to avoid that activity for another week until the pain goes down. d. You may drive when you are no longer taking prescription pain medication, you can comfortably wear a seatbelt, and you can safely maneuver your car and apply brakes. e. You may have sexual intercourse when it is comfortable.  8. FOLLOW UP in our office a. Please call [facility] at [phone] to set up an appointment to see your surgeon in the office for a follow-up appointment approximately 2-3 weeks after your surgery. b. Make sure that you call for this appointment the day you arrive home to insure a convenient appointment time. 9. IF YOU HAVE DISABILITY OR FAMILY LEAVE FORMS, BRING THEM TO THE OFFICE FOR PROCESSING.  DO NOT GIVE THEM TO YOUR DOCTOR.   WHEN TO CALL US [phone]: 1. Poor pain control 2. Reactions / problems with new medications (rash/itching, nausea, etc)  3. Fever over 101.5 F (38.5 C) 4. Worsening swelling or bruising 5. Continued bleeding from incision. 6. Increased pain, redness, or drainage from the incision 7. Difficulty breathing / swallowing   The clinic staff is available to answer your questions during regular  business hours (8:30am-5pm).  Please don't hesitate to call and ask to speak to one of our nurses for clinical concerns.   If you have a medical emergency, go to the nearest emergency room or call 911.  A surgeon from [facility] is always on call at the hospitals   [facility] [address], [address], [place], [place]  [address] ? MAIN: [phone] ? TOLL FREE: [phone] ?  FAX [phone] [email or web address]   Post Anesthesia Home Care Instructions  Activity: Get plenty of rest for the remainder of the day. A responsible individual must stay with you for 24 hours following the procedure.  For the next 24 hours, DO NOT: -Drive a car -Operate machinery -Drink alcoholic beverages -Take any medication unless instructed by your physician -Make any legal decisions or sign important papers.  Meals: Start with liquid foods such as gelatin or soup. Progress to regular foods as tolerated. Avoid greasy, spicy, heavy foods. If nausea and/or vomiting occur, drink only clear liquids until the nausea and/or vomiting subsides. Call your physician if vomiting continues.  Special Instructions/Symptoms: Your throat may feel dry or sore   from the anesthesia or the breathing tube placed in your throat during surgery. If this causes discomfort, gargle with warm salt water. The discomfort should disappear within 24 hours.  If you had a scopolamine patch placed behind your ear for the management of post- operative nausea and/or vomiting:  1. The medication in the patch is effective for 72 hours, after which it should be removed.  Wrap patch in a tissue and discard in the trash. Wash hands thoroughly with soap and water. 2. You may remove the patch earlier than 72 hours if you experience unpleasant side effects which may include dry mouth, dizziness or visual disturbances. 3. Avoid touching the patch. Wash your hands with soap and water after contact  with the patch.      

## 2018-12-09 NOTE — Anesthesia Postprocedure Evaluation (Signed)
Anesthesia Post Note  Patient: MAIRYN LENAHAN  Procedure(s) Performed: PORT REMOVAL (N/A )     Patient location during evaluation: PACU Anesthesia Type: MAC Level of consciousness: awake and alert Pain management: pain level controlled Vital Signs Assessment: post-procedure vital signs reviewed and stable Respiratory status: spontaneous breathing, nonlabored ventilation, respiratory function stable and patient connected to nasal cannula oxygen Cardiovascular status: stable and blood pressure returned to baseline Postop Assessment: no apparent nausea or vomiting Anesthetic complications: no    Last Vitals:  Vitals:   12/09/18 1145 12/09/18 1218  BP: 115/64 (!) 159/86  Pulse: 76 77  Resp: 17 18  Temp:  36.6 C  SpO2: 96% 98%    Last Pain:  Vitals:   12/09/18 1218  TempSrc: Oral  PainSc: 0-No pain                 Kameelah Minish S

## 2018-12-10 ENCOUNTER — Encounter (HOSPITAL_BASED_OUTPATIENT_CLINIC_OR_DEPARTMENT_OTHER): Payer: Self-pay | Admitting: Surgery

## 2018-12-21 ENCOUNTER — Ambulatory Visit
Admission: RE | Admit: 2018-12-21 | Discharge: 2018-12-21 | Disposition: A | Discharge status: Home / Self Care | Payer: 59 | Source: Ambulatory Visit | Attending: Radiation Oncology | Admitting: Radiation Oncology

## 2018-12-21 ENCOUNTER — Other Ambulatory Visit: Payer: Self-pay

## 2018-12-21 DIAGNOSIS — C50511 Malignant neoplasm of lower-outer quadrant of right female breast: Secondary | ICD-10-CM | POA: Diagnosis not present

## 2018-12-21 DIAGNOSIS — Z171 Estrogen receptor negative status [ER-]: Secondary | ICD-10-CM | POA: Diagnosis present

## 2018-12-23 NOTE — Progress Notes (Signed)
  Radiation Oncology         [phone] ________________________________  Name: [name] MRN: [id number]  Date: 12/21/2018  DOB: 03/26/1976  Optical Surface Tracking Plan:  Since intensity modulated radiotherapy (IMRT) and 3D conformal radiation treatment methods are predicated on accurate and precise positioning for treatment, intrafraction motion monitoring is medically necessary to ensure accurate and safe treatment delivery.  The ability to quantify intrafraction motion without excessive ionizing radiation dose can only be performed with optical surface tracking. Accordingly, surface imaging offers the opportunity to obtain 3D measurements of patient position throughout IMRT and 3D treatments without excessive radiation exposure.  I am ordering optical surface tracking for this patient's upcoming course of radiotherapy. ________________________________  Kyung Rudd, MD 12/23/2018 3:04 PM    Reference:   Particia Jasper, et al. Surface imaging-based analysis of intrafraction motion for breast radiotherapy patients.Journal of [place], n. 6, nov. 2014. ISSN [id number].   Available at: <http://www.jacmp.org/index.php/jacmp/article/view/4957>.

## 2018-12-23 NOTE — Progress Notes (Signed)
  Radiation Oncology         [phone] ________________________________  Name: [name] MRN: [id number]  Date: 12/21/2018  DOB: 1976-02-02  DIAGNOSIS:     ICD-10-CM   1. Malignant neoplasm of lower-outer quadrant of right breast of female, estrogen receptor negative ([facility])  C50.511    Z17.1      SIMULATION AND TREATMENT PLANNING NOTE  The patient presented for simulation prior to beginning her course of radiation treatment for her diagnosis of right-sided breast cancer. The patient was placed in a supine position on a breast board. A customized vac-lock bag was constructed and this complex treatment device will be used on a daily basis during her treatment. In this fashion, a CT scan was obtained through the chest area and an isocenter was placed near the chest wall within the breast.  The patient will be planned to receive a course of radiation initially to a dose of 50.4 Gy. This will consist of a whole breast radiotherapy technique. To accomplish this, 2 customized blocks have been designed which will correspond to medial and lateral whole breast tangent fields. This treatment will be accomplished at 1.8 Gy per fraction. A forward planning technique will also be evaluated to determine if this approach improves the plan. It is anticipated that the patient will then receive a 10 Gy boost to the seroma cavity which has been contoured. This will be accomplished at 2 Gy per fraction.   This initial treatment will consist of a 3-D conformal technique. The seroma has been contoured as the primary target structure. Additionally, dose volume histograms of both this target as well as the lungs and heart will also be evaluated. Such an approach is necessary to ensure that the target area is adequately covered while the nearby critical  normal structures are adequately spared.  Plan:  The final anticipated total dose therefore will correspond to 60.4 Gy.    _______________________________    Jodelle Gross, MD, PhD

## 2018-12-28 ENCOUNTER — Ambulatory Visit
Admission: RE | Admit: 2018-12-28 | Discharge: 2018-12-28 | Disposition: A | Discharge status: Home / Self Care | Payer: 59 | Source: Ambulatory Visit | Attending: Radiation Oncology | Admitting: Radiation Oncology

## 2018-12-28 ENCOUNTER — Other Ambulatory Visit: Payer: Self-pay

## 2018-12-28 DIAGNOSIS — C50511 Malignant neoplasm of lower-outer quadrant of right female breast: Secondary | ICD-10-CM | POA: Diagnosis not present

## 2018-12-29 ENCOUNTER — Ambulatory Visit
Admission: RE | Admit: 2018-12-29 | Discharge: 2018-12-29 | Disposition: A | Discharge status: Home / Self Care | Payer: 59 | Source: Ambulatory Visit | Attending: Radiation Oncology | Admitting: Radiation Oncology

## 2018-12-29 ENCOUNTER — Other Ambulatory Visit: Payer: Self-pay

## 2018-12-29 DIAGNOSIS — C50511 Malignant neoplasm of lower-outer quadrant of right female breast: Secondary | ICD-10-CM

## 2018-12-29 DIAGNOSIS — Z171 Estrogen receptor negative status [ER-]: Secondary | ICD-10-CM

## 2018-12-29 MED ORDER — ALRA NON-METALLIC DEODORANT (RAD-ONC)
1.0000 "application " | Freq: Once | TOPICAL | Status: AC
  Administered 2018-12-29: 1 via TOPICAL

## 2018-12-29 MED ORDER — RADIAPLEXRX EX GEL
Freq: Once | CUTANEOUS | Status: AC
  Administered 2018-12-29: 11:00:00 via TOPICAL

## 2018-12-29 NOTE — Progress Notes (Signed)
Pt here for patient teaching.  Pt given Radiation and You booklet, skin care instructions, Alra deodorant and Radiaplex gel.  Reviewed areas of pertinence such as fatigue, hair loss, skin changes, breast tenderness and breast swelling . Pt able to give teach back of to pat skin and use unscented/gentle soap,apply Radiaplex bid, avoid applying anything to skin within 4 hours of treatment, avoid wearing an under wire bra and to use an electric razor if they must shave. Pt verbalizes understanding of information given and will contact nursing with any questions or concerns.     Laporsha Grealish M. Shervin Cypert RN, BSN      

## 2018-12-30 ENCOUNTER — Ambulatory Visit
Admission: RE | Admit: 2018-12-30 | Discharge: 2018-12-30 | Disposition: A | Discharge status: Home / Self Care | Payer: 59 | Source: Ambulatory Visit | Attending: Radiation Oncology | Admitting: Radiation Oncology

## 2018-12-30 ENCOUNTER — Other Ambulatory Visit: Payer: Self-pay

## 2018-12-30 DIAGNOSIS — Z171 Estrogen receptor negative status [ER-]: Secondary | ICD-10-CM | POA: Diagnosis present

## 2018-12-30 DIAGNOSIS — C50511 Malignant neoplasm of lower-outer quadrant of right female breast: Secondary | ICD-10-CM | POA: Insufficient documentation

## 2018-12-31 ENCOUNTER — Other Ambulatory Visit: Payer: Self-pay

## 2018-12-31 ENCOUNTER — Ambulatory Visit
Admission: RE | Admit: 2018-12-31 | Discharge: 2018-12-31 | Disposition: A | Discharge status: Home / Self Care | Payer: 59 | Source: Ambulatory Visit | Attending: Radiation Oncology | Admitting: Radiation Oncology

## 2018-12-31 DIAGNOSIS — C50511 Malignant neoplasm of lower-outer quadrant of right female breast: Secondary | ICD-10-CM | POA: Diagnosis not present

## 2019-01-03 ENCOUNTER — Other Ambulatory Visit: Payer: Self-pay

## 2019-01-03 ENCOUNTER — Ambulatory Visit
Admission: RE | Admit: 2019-01-03 | Discharge: 2019-01-03 | Disposition: A | Discharge status: Home / Self Care | Payer: 59 | Source: Ambulatory Visit | Attending: Radiation Oncology | Admitting: Radiation Oncology

## 2019-01-03 DIAGNOSIS — C50511 Malignant neoplasm of lower-outer quadrant of right female breast: Secondary | ICD-10-CM | POA: Diagnosis not present

## 2019-01-04 ENCOUNTER — Ambulatory Visit
Admission: RE | Admit: 2019-01-04 | Discharge: 2019-01-04 | Disposition: A | Discharge status: Home / Self Care | Payer: 59 | Source: Ambulatory Visit | Attending: Radiation Oncology | Admitting: Radiation Oncology

## 2019-01-04 ENCOUNTER — Other Ambulatory Visit: Payer: Self-pay

## 2019-01-04 DIAGNOSIS — C50511 Malignant neoplasm of lower-outer quadrant of right female breast: Secondary | ICD-10-CM | POA: Diagnosis not present

## 2019-01-05 ENCOUNTER — Ambulatory Visit
Admission: RE | Admit: 2019-01-05 | Discharge: 2019-01-05 | Disposition: A | Discharge status: Home / Self Care | Payer: 59 | Source: Ambulatory Visit | Attending: Radiation Oncology | Admitting: Radiation Oncology

## 2019-01-05 ENCOUNTER — Other Ambulatory Visit: Payer: Self-pay

## 2019-01-05 DIAGNOSIS — C50511 Malignant neoplasm of lower-outer quadrant of right female breast: Secondary | ICD-10-CM | POA: Diagnosis not present

## 2019-01-06 ENCOUNTER — Other Ambulatory Visit: Payer: Self-pay

## 2019-01-06 ENCOUNTER — Ambulatory Visit
Admission: RE | Admit: 2019-01-06 | Discharge: 2019-01-06 | Disposition: A | Discharge status: Home / Self Care | Payer: 59 | Source: Ambulatory Visit | Attending: Radiation Oncology | Admitting: Radiation Oncology

## 2019-01-06 DIAGNOSIS — C50511 Malignant neoplasm of lower-outer quadrant of right female breast: Secondary | ICD-10-CM | POA: Diagnosis not present

## 2019-01-07 ENCOUNTER — Other Ambulatory Visit: Payer: Self-pay

## 2019-01-07 ENCOUNTER — Ambulatory Visit
Admission: RE | Admit: 2019-01-07 | Discharge: 2019-01-07 | Disposition: A | Discharge status: Home / Self Care | Payer: 59 | Source: Ambulatory Visit | Attending: Radiation Oncology | Admitting: Radiation Oncology

## 2019-01-07 DIAGNOSIS — C50511 Malignant neoplasm of lower-outer quadrant of right female breast: Secondary | ICD-10-CM | POA: Diagnosis not present

## 2019-01-10 ENCOUNTER — Ambulatory Visit
Admission: RE | Admit: 2019-01-10 | Discharge: 2019-01-10 | Disposition: A | Discharge status: Home / Self Care | Payer: 59 | Source: Ambulatory Visit | Attending: Radiation Oncology | Admitting: Radiation Oncology

## 2019-01-10 ENCOUNTER — Other Ambulatory Visit: Payer: Self-pay

## 2019-01-10 DIAGNOSIS — C50511 Malignant neoplasm of lower-outer quadrant of right female breast: Secondary | ICD-10-CM | POA: Diagnosis not present

## 2019-01-11 ENCOUNTER — Other Ambulatory Visit: Payer: Self-pay

## 2019-01-11 ENCOUNTER — Ambulatory Visit
Admission: RE | Admit: 2019-01-11 | Discharge: 2019-01-11 | Disposition: A | Discharge status: Home / Self Care | Payer: 59 | Source: Ambulatory Visit | Attending: Radiation Oncology | Admitting: Radiation Oncology

## 2019-01-11 DIAGNOSIS — C50511 Malignant neoplasm of lower-outer quadrant of right female breast: Secondary | ICD-10-CM | POA: Diagnosis not present

## 2019-01-12 ENCOUNTER — Ambulatory Visit
Admission: RE | Admit: 2019-01-12 | Discharge: 2019-01-12 | Disposition: A | Discharge status: Home / Self Care | Payer: 59 | Source: Ambulatory Visit | Attending: Radiation Oncology | Admitting: Radiation Oncology

## 2019-01-12 ENCOUNTER — Other Ambulatory Visit: Payer: Self-pay

## 2019-01-12 DIAGNOSIS — C50511 Malignant neoplasm of lower-outer quadrant of right female breast: Secondary | ICD-10-CM | POA: Diagnosis not present

## 2019-01-13 ENCOUNTER — Ambulatory Visit
Admission: RE | Admit: 2019-01-13 | Discharge: 2019-01-13 | Disposition: A | Discharge status: Home / Self Care | Payer: 59 | Source: Ambulatory Visit | Attending: Radiation Oncology | Admitting: Radiation Oncology

## 2019-01-13 ENCOUNTER — Other Ambulatory Visit: Payer: Self-pay

## 2019-01-13 DIAGNOSIS — C50511 Malignant neoplasm of lower-outer quadrant of right female breast: Secondary | ICD-10-CM | POA: Diagnosis not present

## 2019-01-14 ENCOUNTER — Other Ambulatory Visit: Payer: Self-pay

## 2019-01-14 ENCOUNTER — Ambulatory Visit
Admission: RE | Admit: 2019-01-14 | Discharge: 2019-01-14 | Disposition: A | Discharge status: Home / Self Care | Payer: 59 | Source: Ambulatory Visit | Attending: Radiation Oncology | Admitting: Radiation Oncology

## 2019-01-14 DIAGNOSIS — C50511 Malignant neoplasm of lower-outer quadrant of right female breast: Secondary | ICD-10-CM | POA: Diagnosis not present

## 2019-01-17 ENCOUNTER — Other Ambulatory Visit: Payer: Self-pay

## 2019-01-17 ENCOUNTER — Ambulatory Visit
Admission: RE | Admit: 2019-01-17 | Discharge: 2019-01-17 | Disposition: A | Discharge status: Home / Self Care | Payer: 59 | Source: Ambulatory Visit | Attending: Radiation Oncology | Admitting: Radiation Oncology

## 2019-01-17 DIAGNOSIS — C50511 Malignant neoplasm of lower-outer quadrant of right female breast: Secondary | ICD-10-CM | POA: Diagnosis not present

## 2019-01-18 ENCOUNTER — Ambulatory Visit
Admission: RE | Admit: 2019-01-18 | Discharge: 2019-01-18 | Disposition: A | Discharge status: Home / Self Care | Payer: 59 | Source: Ambulatory Visit | Attending: Radiation Oncology | Admitting: Radiation Oncology

## 2019-01-18 ENCOUNTER — Other Ambulatory Visit: Payer: Self-pay

## 2019-01-18 DIAGNOSIS — C50511 Malignant neoplasm of lower-outer quadrant of right female breast: Secondary | ICD-10-CM | POA: Diagnosis not present

## 2019-01-19 ENCOUNTER — Ambulatory Visit
Admission: RE | Admit: 2019-01-19 | Discharge: 2019-01-19 | Disposition: A | Discharge status: Home / Self Care | Payer: 59 | Source: Ambulatory Visit | Attending: Radiation Oncology | Admitting: Radiation Oncology

## 2019-01-19 ENCOUNTER — Other Ambulatory Visit: Payer: Self-pay

## 2019-01-19 DIAGNOSIS — C50511 Malignant neoplasm of lower-outer quadrant of right female breast: Secondary | ICD-10-CM | POA: Diagnosis not present

## 2019-01-20 ENCOUNTER — Other Ambulatory Visit: Payer: Self-pay

## 2019-01-20 ENCOUNTER — Ambulatory Visit
Admission: RE | Admit: 2019-01-20 | Discharge: 2019-01-20 | Disposition: A | Discharge status: Home / Self Care | Payer: 59 | Source: Ambulatory Visit | Attending: Radiation Oncology | Admitting: Radiation Oncology

## 2019-01-20 DIAGNOSIS — C50511 Malignant neoplasm of lower-outer quadrant of right female breast: Secondary | ICD-10-CM | POA: Diagnosis not present

## 2019-01-21 ENCOUNTER — Ambulatory Visit
Admission: RE | Admit: 2019-01-21 | Discharge: 2019-01-21 | Disposition: A | Discharge status: Home / Self Care | Payer: 59 | Source: Ambulatory Visit | Attending: Radiation Oncology | Admitting: Radiation Oncology

## 2019-01-21 ENCOUNTER — Other Ambulatory Visit: Payer: Self-pay

## 2019-01-21 DIAGNOSIS — C50511 Malignant neoplasm of lower-outer quadrant of right female breast: Secondary | ICD-10-CM | POA: Diagnosis not present

## 2019-01-24 ENCOUNTER — Ambulatory Visit
Admission: RE | Admit: 2019-01-24 | Discharge: 2019-01-24 | Disposition: A | Discharge status: Home / Self Care | Payer: 59 | Source: Ambulatory Visit | Attending: Radiation Oncology | Admitting: Radiation Oncology

## 2019-01-24 ENCOUNTER — Other Ambulatory Visit: Payer: Self-pay

## 2019-01-24 DIAGNOSIS — C50511 Malignant neoplasm of lower-outer quadrant of right female breast: Secondary | ICD-10-CM | POA: Diagnosis not present

## 2019-01-25 ENCOUNTER — Ambulatory Visit
Admission: RE | Admit: 2019-01-25 | Discharge: 2019-01-25 | Disposition: A | Discharge status: Home / Self Care | Payer: 59 | Source: Ambulatory Visit | Attending: Radiation Oncology | Admitting: Radiation Oncology

## 2019-01-25 ENCOUNTER — Other Ambulatory Visit: Payer: Self-pay

## 2019-01-25 DIAGNOSIS — C50511 Malignant neoplasm of lower-outer quadrant of right female breast: Secondary | ICD-10-CM | POA: Diagnosis not present

## 2019-01-26 ENCOUNTER — Ambulatory Visit
Admission: RE | Admit: 2019-01-26 | Discharge: 2019-01-26 | Disposition: A | Discharge status: Home / Self Care | Payer: 59 | Source: Ambulatory Visit | Attending: Radiation Oncology | Admitting: Radiation Oncology

## 2019-01-26 ENCOUNTER — Other Ambulatory Visit: Payer: Self-pay

## 2019-01-26 DIAGNOSIS — C50511 Malignant neoplasm of lower-outer quadrant of right female breast: Secondary | ICD-10-CM | POA: Diagnosis not present

## 2019-01-27 ENCOUNTER — Other Ambulatory Visit: Payer: Self-pay

## 2019-01-27 ENCOUNTER — Ambulatory Visit
Admission: RE | Admit: 2019-01-27 | Discharge: 2019-01-27 | Disposition: A | Discharge status: Home / Self Care | Payer: 59 | Source: Ambulatory Visit | Attending: Radiation Oncology | Admitting: Radiation Oncology

## 2019-01-27 DIAGNOSIS — C50511 Malignant neoplasm of lower-outer quadrant of right female breast: Secondary | ICD-10-CM | POA: Diagnosis not present

## 2019-01-28 ENCOUNTER — Ambulatory Visit
Admission: RE | Admit: 2019-01-28 | Discharge: 2019-01-28 | Disposition: A | Discharge status: Home / Self Care | Payer: 59 | Source: Ambulatory Visit | Attending: Radiation Oncology | Admitting: Radiation Oncology

## 2019-01-28 ENCOUNTER — Other Ambulatory Visit: Payer: Self-pay

## 2019-01-28 DIAGNOSIS — C50511 Malignant neoplasm of lower-outer quadrant of right female breast: Secondary | ICD-10-CM

## 2019-01-28 MED ORDER — RADIAPLEXRX EX GEL
Freq: Once | CUTANEOUS | Status: AC
  Administered 2019-01-28: 09:00:00 via TOPICAL

## 2019-01-31 ENCOUNTER — Other Ambulatory Visit: Payer: Self-pay

## 2019-01-31 ENCOUNTER — Ambulatory Visit
Admission: RE | Admit: 2019-01-31 | Discharge: 2019-01-31 | Disposition: A | Discharge status: Home / Self Care | Payer: 59 | Source: Ambulatory Visit | Attending: Radiation Oncology | Admitting: Radiation Oncology

## 2019-01-31 DIAGNOSIS — C50511 Malignant neoplasm of lower-outer quadrant of right female breast: Secondary | ICD-10-CM | POA: Diagnosis not present

## 2019-01-31 DIAGNOSIS — Z171 Estrogen receptor negative status [ER-]: Secondary | ICD-10-CM | POA: Diagnosis present

## 2019-02-01 ENCOUNTER — Other Ambulatory Visit: Payer: Self-pay

## 2019-02-01 ENCOUNTER — Ambulatory Visit
Admission: RE | Admit: 2019-02-01 | Discharge: 2019-02-01 | Disposition: A | Discharge status: Home / Self Care | Payer: 59 | Source: Ambulatory Visit | Attending: Radiation Oncology | Admitting: Radiation Oncology

## 2019-02-01 DIAGNOSIS — C50511 Malignant neoplasm of lower-outer quadrant of right female breast: Secondary | ICD-10-CM | POA: Diagnosis not present

## 2019-02-02 ENCOUNTER — Other Ambulatory Visit: Payer: Self-pay

## 2019-02-02 ENCOUNTER — Ambulatory Visit
Admission: RE | Admit: 2019-02-02 | Discharge: 2019-02-02 | Disposition: A | Discharge status: Home / Self Care | Payer: 59 | Source: Ambulatory Visit | Attending: Radiation Oncology | Admitting: Radiation Oncology

## 2019-02-02 DIAGNOSIS — C50511 Malignant neoplasm of lower-outer quadrant of right female breast: Secondary | ICD-10-CM | POA: Diagnosis not present

## 2019-02-03 ENCOUNTER — Ambulatory Visit
Admission: RE | Admit: 2019-02-03 | Discharge: 2019-02-03 | Disposition: A | Discharge status: Home / Self Care | Payer: 59 | Source: Ambulatory Visit | Attending: Radiation Oncology | Admitting: Radiation Oncology

## 2019-02-03 ENCOUNTER — Other Ambulatory Visit: Payer: Self-pay

## 2019-02-03 DIAGNOSIS — C50511 Malignant neoplasm of lower-outer quadrant of right female breast: Secondary | ICD-10-CM | POA: Diagnosis not present

## 2019-02-04 ENCOUNTER — Other Ambulatory Visit: Payer: Self-pay

## 2019-02-04 ENCOUNTER — Ambulatory Visit
Admission: RE | Admit: 2019-02-04 | Discharge: 2019-02-04 | Disposition: A | Discharge status: Home / Self Care | Payer: 59 | Source: Ambulatory Visit | Attending: Radiation Oncology | Admitting: Radiation Oncology

## 2019-02-04 DIAGNOSIS — C50511 Malignant neoplasm of lower-outer quadrant of right female breast: Secondary | ICD-10-CM | POA: Diagnosis not present

## 2019-02-06 NOTE — Progress Notes (Addendum)
Patient Care Team: Annye English as PCP - General (Physician Assistant) Rockwell Germany, RN as Oncology Nurse Navigator Mauro Kaufmann, RN as Oncology Nurse Navigator  DIAGNOSIS:    ICD-10-CM   1. Malignant neoplasm of lower-outer quadrant of right breast of female, estrogen receptor negative ([place])  C50.511    Z17.1     SUMMARY OF ONCOLOGIC HISTORY: Oncology History  Malignant neoplasm of lower-outer quadrant of right breast of female, estrogen receptor negative ([place])  09/27/2018 Cancer Staging   Staging form: Breast, AJCC 8th Edition - Clinical stage from 09/27/2018: Stage IB (cT1b, cN0, cM0, G3, ER-, PR-, HER2-) - Signed by Gardenia Phlegm, NP on 10/06/2018   10/06/2018 Initial Diagnosis   Screening mammogram detected right breast mass. Diagnostic mammogram showed 83m mass with adjacent 34mmass in the right breast with no axillary adenopathy. 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  The report date is 10/13/2018.  The Common Hereditary Gene Panel offered by [facility] includes sequencing and/or deletion duplication testing of the following 48 genes: APC, ATM, AXIN2, BARD1, BMPR1A, BRCA1, BRCA2, BRIP1, CDH1, CDK4, CDKN2A (p14ARF), CDKN2A (p16INK4a), CHEK2, CTNNA1, DICER1, EPCAM (Deletion/duplication testing only), GREM1 (promoter region deletion/duplication testing only), KIT, MEN1, MLH1, MSH2, MSH3, MSH6, MUTYH, NBN, NF1, NHTL1, PALB2, PDGFRA, PMS2, POLD1, POLE, PTEN, RAD50, RAD51C, RAD51D, RNF43, SDHB, SDHC, SDHD, SMAD4, SMARCA4. STK11, TP53, TSC1, TSC2, and VHL.  The following genes were evaluated for sequence changes only: SDHA and HOXB13 c.251G>A variant only.    11/03/2018 Surgery   Right lumpectomy ([facility]): IDC,  0.5cm, grade 3, clear margins, 2 axillary lymph nodes negative.  Triple negative with a Ki-67 of 90%   11/10/2018 Cancer Staging   Staging form: Breast, AJCC 8th Edition - Pathologic: Stage IB (pT1a, pN0, cM0, G3, ER-, PR-, HER2-) - Signed by GuNicholas LoseMD on 11/10/2018   12/29/2018 -  Radiation Therapy   Adjuvant radiation therapy     CHIEF COMPLIANT: Follow-up of right breast cancer to discuss further treatment   INTERVAL HISTORY: [name] a [age].o. with above-mentioned history of triple negative right breast cancer who underwent a lumpectomy and is currently undergoing radiation. She presents to the clinic today to discuss further treatment.  She has extreme soreness from the radiation therapy with oozing from the skin.  She is finishing radiation this Thursday.  REVIEW OF SYSTEMS:   Constitutional: Denies fevers, chills or abnormal weight loss Eyes: Denies blurriness of vision Ears, nose, mouth, throat, and face: Denies mucositis or sore throat Respiratory: Denies cough, dyspnea or wheezes Cardiovascular: Denies palpitation, chest discomfort Gastrointestinal: Denies nausea, heartburn or change in bowel habits Skin: Denies abnormal skin rashes Lymphatics: Denies new lymphadenopathy or easy bruising Neurological: Denies numbness, tingling or new weaknesses Behavioral/Psych: Mood is stable, no new changes  Extremities: No lower extremity edema Breast: Severe radiation dermatitis All other systems were reviewed with the patient and are negative.  I have reviewed the past medical history, past surgical history, social history and family history with the patient and they are unchanged from previous note.  ALLERGIES:  has No Known Allergies.  MEDICATIONS:  Current Outpatient Medications  Medication Sig Dispense Refill  . Erenumab-aooe (AIMOVIG) 140 MG/ML SOAJ Inject 140 mg into the skin every  30 (thirty) days. 1.12 mg   . ibuprofen (ADVIL) 800 MG tablet Take 1 tablet  (800 mg total) by mouth every 8 (eight) hours as needed. 30 tablet 0  . Melatonin 10 MG TABS Take 1 tablet by mouth at bedtime as needed.    Marland Kitchen NUVARING 0.12-0.015 MG/24HR vaginal ring Place 1 each vaginally every 30 (thirty) days.    . pantoprazole (PROTONIX) 40 MG tablet Take 1 tablet (40 mg total) by mouth daily. 90 tablet 1  . promethazine (PHENERGAN) 12.5 MG tablet Take 12.5 mg by mouth every 6 (six) hours as needed for nausea.    . rizatriptan (MAXALT) 10 MG tablet Take 10 mg by mouth as needed for migraine. May repeat in 2 hours if needed    . sertraline (ZOLOFT) 100 MG tablet Take 1.5 tablets (150 mg total) by mouth daily. PATIENT NEEDS OFFICE VISIT FOR ADDITIONAL REFILLS 45 tablet 0  . SUMAtriptan Succinate Refill 6 MG/0.5ML SOCT Inject 6 mg into the skin as needed. 15 mL   . topiramate (TOPAMAX) 100 MG tablet Take 100 mg by mouth daily.    . traMADol (ULTRAM) 50 MG tablet tramadol 50 mg tablet  Take 1 tablet every 6 hours by oral route.     No current facility-administered medications for this visit.     PHYSICAL EXAMINATION: ECOG PERFORMANCE STATUS: 1 - Symptomatic but completely ambulatory  Vitals:   02/07/19 0831  BP: 131/84  Pulse: 97  Resp: 20  Temp: 98.5 F (36.9 C)  SpO2: 98%   Filed Weights   02/07/19 0831  Weight: 230 lb 8 oz (104.6 kg)    GENERAL: alert, no distress and comfortable SKIN: skin color, texture, turgor are normal, no rashes or significant lesions EYES: normal, Conjunctiva are pink and non-injected, sclera clear OROPHARYNX: no exudate, no erythema and lips, buccal mucosa, and tongue normal  NECK: supple, thyroid normal size, non-tender, without nodularity LYMPH: no palpable lymphadenopathy in the cervical, axillary or inguinal LUNGS: clear to auscultation and percussion with normal breathing effort HEART: regular rate & rhythm and no murmurs and no lower extremity edema ABDOMEN: abdomen soft, non-tender and normal bowel sounds MUSCULOSKELETAL:  no cyanosis of digits and no clubbing  NEURO: alert & oriented x 3 with fluent speech, no focal motor/sensory deficits EXTREMITIES: No lower extremity edema  LABORATORY DATA:  I have reviewed the data as listed CMP Latest Ref Rng & Units 11/01/2018  Glucose 70 - 99 mg/dL 106(H)  BUN 6 - 20 mg/dL 10  Creatinine 0.44 - 1.00 mg/dL 0.97  Sodium 135 - 145 mmol/L 139  Potassium 3.5 - 5.1 mmol/L 3.7  Chloride 98 - 111 mmol/L 109  CO2 22 - 32 mmol/L 20(L)  Calcium 8.9 - 10.3 mg/dL 9.0  Total Protein 6.5 - 8.1 g/dL 6.7  Total Bilirubin 0.3 - 1.2 mg/dL 0.2(L)  Alkaline Phos 38 - 126 U/L 86  AST 15 - 41 U/L 17  ALT 0 - 44 U/L 15    Lab Results  Component Value Date   WBC 7.7 11/01/2018   HGB 15.5 (H) 11/01/2018   HCT 47.4 (H) 11/01/2018   MCV 87.5 11/01/2018   PLT 248 11/01/2018   NEUTROABS 4.0 11/01/2018    ASSESSMENT & PLAN:  Malignant neoplasm of lower-outer quadrant of right breast of female, estrogen receptor negative ([place]) 10/06/2018:Screening mammogram detected right breast mass. Diagnostic mammogram showed 65m mass with adjacent 352mmass in the right breast with no axillary adenopathy. Biopsy confirmed IDC with DCIS,  grade 3, HER-2 negative (0), ER/PR negative, Ki67 90%.  Recommendations: 1. 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 ---------------------------------------------------------------------------------------------------------------------------------------- 11/03/2018: Right lumpectomy Right lumpectomy ([facility]): IDC, 0.5cm, grade 3, clear margins, 2 axillary lymph nodes negative.  Triple negative with a Ki-67 of 90% Pathological stage: Stage Ib Because of the small size of the tumor we did not recommend systemic chemotherapy. Having completed adjuvant radiation  patient will go on surveillance.  Return to clinic in 3 months for survivorship care plan visit    No orders of the defined types were placed in this encounter.  The patient has a good understanding of the overall plan. she agrees with it. she will call with any problems that may develop before the next visit here.  Nicholas Lose, MD 02/07/2019  Julious Oka Dorshimer am acting as scribe for Dr. Nicholas Lose.  I have reviewed the above documentation for accuracy and completeness, and I agree with the above.

## 2019-02-07 ENCOUNTER — Ambulatory Visit
Admission: RE | Admit: 2019-02-07 | Discharge: 2019-02-07 | Disposition: A | Discharge status: Home / Self Care | Payer: 59 | Source: Ambulatory Visit | Attending: Radiation Oncology | Admitting: Radiation Oncology

## 2019-02-07 ENCOUNTER — Other Ambulatory Visit: Payer: Self-pay

## 2019-02-07 ENCOUNTER — Inpatient Hospital Stay: Payer: 59 | Attending: Genetic Counselor | Admitting: Hematology and Oncology

## 2019-02-07 DIAGNOSIS — Z923 Personal history of irradiation: Secondary | ICD-10-CM | POA: Diagnosis not present

## 2019-02-07 DIAGNOSIS — Z79899 Other long term (current) drug therapy: Secondary | ICD-10-CM | POA: Insufficient documentation

## 2019-02-07 DIAGNOSIS — Z791 Long term (current) use of non-steroidal anti-inflammatories (NSAID): Secondary | ICD-10-CM | POA: Insufficient documentation

## 2019-02-07 DIAGNOSIS — C50511 Malignant neoplasm of lower-outer quadrant of right female breast: Secondary | ICD-10-CM | POA: Insufficient documentation

## 2019-02-07 DIAGNOSIS — Z171 Estrogen receptor negative status [ER-]: Secondary | ICD-10-CM | POA: Diagnosis not present

## 2019-02-07 DIAGNOSIS — Z793 Long term (current) use of hormonal contraceptives: Secondary | ICD-10-CM | POA: Diagnosis not present

## 2019-02-07 MED ORDER — MELATONIN 10 MG PO TABS: 1.0000 | ORAL_TABLET | Freq: Every evening | ORAL | Status: DC | PRN

## 2019-02-07 NOTE — Assessment & Plan Note (Signed)
10/06/2018:Screening mammogram detected right breast mass. Diagnostic mammogram showed 51m mass with adjacent 349mmass in the right breast with no axillary adenopathy. 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  Recommendations: 1. Breast conserving surgery 11/03/2018: IDC 0.5 cm, grade 3, 0/2 lymph nodes negative, triple negative, Ki-67 90% 2.Adjuvant radiation therapy 12/29/2018-02/07/2019   Patient works as a 91Programmer, applicationsnd her husband KeLennette Bihariorks as a poEngineer, structural---------------------------------------------------------------------------------------------------------------------------------------- 11/03/2018: Right lumpectomy Right lumpectomy ([facility]): IDC, 0.5cm, grade 3, clear margins, 2 axillary lymph nodes negative.  Triple negative with a Ki-67 of 90% Pathological stage: Stage Ib Because of the small size of the tumor we did not recommend systemic chemotherapy. Having completed adjuvant radiation patient will go on surveillance.  Return to clinic in 3 months for survivorship care plan visit

## 2019-02-08 ENCOUNTER — Other Ambulatory Visit: Payer: Self-pay

## 2019-02-08 ENCOUNTER — Ambulatory Visit
Admission: RE | Admit: 2019-02-08 | Discharge: 2019-02-08 | Disposition: A | Discharge status: Home / Self Care | Payer: 59 | Source: Ambulatory Visit | Attending: Radiation Oncology | Admitting: Radiation Oncology

## 2019-02-08 ENCOUNTER — Telehealth: Payer: Self-pay | Admitting: Hematology and Oncology

## 2019-02-08 DIAGNOSIS — C50511 Malignant neoplasm of lower-outer quadrant of right female breast: Secondary | ICD-10-CM | POA: Diagnosis not present

## 2019-02-08 NOTE — Telephone Encounter (Signed)
I left a message regarding schedule  

## 2019-02-09 ENCOUNTER — Ambulatory Visit
Admission: RE | Admit: 2019-02-09 | Discharge: 2019-02-09 | Disposition: A | Discharge status: Home / Self Care | Payer: 59 | Source: Ambulatory Visit | Attending: Radiation Oncology | Admitting: Radiation Oncology

## 2019-02-09 ENCOUNTER — Other Ambulatory Visit: Payer: Self-pay

## 2019-02-09 DIAGNOSIS — C50511 Malignant neoplasm of lower-outer quadrant of right female breast: Secondary | ICD-10-CM | POA: Diagnosis not present

## 2019-02-10 ENCOUNTER — Other Ambulatory Visit: Payer: Self-pay

## 2019-02-10 ENCOUNTER — Encounter: Payer: Self-pay | Admitting: *Deleted

## 2019-02-10 ENCOUNTER — Ambulatory Visit
Admission: RE | Admit: 2019-02-10 | Discharge: 2019-02-10 | Disposition: A | Discharge status: Home / Self Care | Payer: 59 | Source: Ambulatory Visit | Attending: Radiation Oncology | Admitting: Radiation Oncology

## 2019-02-10 ENCOUNTER — Encounter: Payer: Self-pay | Admitting: Radiation Oncology

## 2019-02-10 DIAGNOSIS — C50511 Malignant neoplasm of lower-outer quadrant of right female breast: Secondary | ICD-10-CM

## 2019-02-10 MED ORDER — RADIAPLEXRX EX GEL
Freq: Once | CUTANEOUS | Status: AC
  Administered 2019-02-10: 10:00:00 via TOPICAL

## 2019-03-02 NOTE — Progress Notes (Signed)
  Radiation Oncology         (336) [phone] ________________________________  Name: [name] MRN: [id number]  Date: 02/10/2019  DOB: January 15, 1976  End of Treatment Note  Diagnosis:   right-sided breast cancer     Indication for treatment:  Curative       Radiation treatment dates:   12/28/18 - 02/10/19  Site/dose:   The patient initially received a dose of 50.4 Gy in 28 fractions to the breast using whole-breast tangent fields. This was delivered using a 3-D conformal technique. The patient then received a boost to the seroma. This delivered an additional 10 Gy in 5 fractions using a 3-field photon boost technique. The total dose was 60.4 Gy.  Narrative: The patient tolerated radiation treatment relatively well.   The patient had some expected skin irritation as she progressed during treatment. Moist desquamation was present at the end of treatment.  Plan: The patient has completed radiation treatment. The patient will return to radiation oncology clinic for routine followup in one month. I advised the patient to call or return sooner if they have any questions or concerns related to their recovery or treatment. ________________________________  Jodelle Gross, M.D., Ph.D.

## 2019-03-07 ENCOUNTER — Telehealth: Payer: Self-pay | Admitting: Radiation Oncology

## 2019-03-07 NOTE — Telephone Encounter (Signed)
   Per above email this RN phoned patient. No answer. Left voicemail message explaining she should NOT present on Wednesday for a follow up appointment. Provided my direct number for questions.

## 2019-03-09 ENCOUNTER — Ambulatory Visit: Payer: 59 | Admitting: Radiation Oncology

## 2019-03-09 ENCOUNTER — Telehealth: Payer: Self-pay | Admitting: Radiation Oncology

## 2019-03-09 NOTE — Telephone Encounter (Signed)
  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  Interval Since Last Radiation:  4 weeks   12/28/18 - 02/10/19: The patient initially received a dose of 50.4 Gy in 28 fractions to the breast using whole-breast tangent fields. This was delivered using a 3-D conformal technique. The patient then received a boost to the seroma. This delivered an additional 10 Gy in 5 fractions using a 3-field photon boost technique. The total dose was 60.4 Gy.  Narrative:  The patient was contacted today for routine follow-up. During treatment she did very well with radiotherapy and did not have significant desquamation. She reports she is doing well and her skin.  Impression/Plan: 1. 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 The patient has been doing well since completion of radiotherapy. We discussed that we would be happy to continue to follow her as needed, but she will also continue to follow up with Dr. Lindi Adie  in medical oncology. She was counseled on skin care as well as measures to avoid sun exposure to this area.  2. Survivorship. We discussed the importance of survivorship evaluation.     Carola Rhine, PAC

## 2019-03-29 ENCOUNTER — Encounter: Payer: Self-pay | Admitting: *Deleted

## 2019-04-05 ENCOUNTER — Telehealth: Payer: Self-pay

## 2019-04-05 DIAGNOSIS — C50511 Malignant neoplasm of lower-outer quadrant of right female breast: Secondary | ICD-10-CM

## 2019-04-05 DIAGNOSIS — Z171 Estrogen receptor negative status [ER-]: Secondary | ICD-10-CM

## 2019-04-05 NOTE — Telephone Encounter (Signed)
Pt reports severe right breast pain, pain reported to the touch.  Pt denies palpating any lumps or nodules to right breast.  Pt reports some swelling was present to right axillary, but has since subsided.  Pt has had h/o nerve pain with radiation, "this feels different to me."   Pt with h/o triple negative breast cancer, lumpectomy, and radiation to right breast.  RN reviewed with MD.  MD recommends to start PT for possible lymphedema.  RN informed patient, patient voiced understanding.  Referral placed.

## 2019-04-07 ENCOUNTER — Other Ambulatory Visit: Payer: Self-pay

## 2019-04-07 ENCOUNTER — Ambulatory Visit: Payer: 59 | Attending: Hematology and Oncology

## 2019-04-07 DIAGNOSIS — I890 Lymphedema, not elsewhere classified: Secondary | ICD-10-CM

## 2019-04-07 DIAGNOSIS — C50511 Malignant neoplasm of lower-outer quadrant of right female breast: Secondary | ICD-10-CM | POA: Diagnosis not present

## 2019-04-07 DIAGNOSIS — Z171 Estrogen receptor negative status [ER-]: Secondary | ICD-10-CM | POA: Insufficient documentation

## 2019-04-07 NOTE — Patient Instructions (Signed)
Access Code: [email or web address]  URL: [email or web address]  Date: 04/07/2019  Prepared by: Tomma Rakers   Exercises Standing Upper Cervical Flexion and Extension - 20 reps - 1 sets - 1x daily - 7x weekly Standing Cervical Sidebending AROM - 20 reps - 1 sets - 1x daily - 7x weekly Standing Cervical Rotation AROM - 20 reps - 1 sets - 1x daily - 7x weekly Standing Shoulder Flexion Full Range - 20 reps - 1 sets - 1x daily - 7x weekly Standing Shoulder Shrugs - 20 reps - 1 sets - 1x daily - 7x weekly Standing Scapular Retraction - 20 reps - 1 sets - 1x daily - 7x weekly Trunk Sidebending with Compression Garment - 20 reps - 1 sets - 1x daily - 7x weekly Standing Hip Extension with Counter Support - 20 reps - 1 sets - 1x daily - 7x weekly Elbow AROM Flexion & Extension Supinated Forearm - 20 reps - 1 sets - 1x daily - 7x weekly Wrist AROM Flexion Extension - 20 reps - 1 sets - 1x daily - 7x weekly Hand Fist Pumps - 20 reps - 1 sets - 1x daily - 7x weekly

## 2019-04-07 NOTE — Therapy (Signed)
[place], [place], [address] Phone: [phone]   Fax:  [phone]  Physical Therapy Evaluation  Patient Details  Name: [name] MRN: [id number] Date of Birth: 1975-09-13 Referring Provider (PT): Nicholas Lose MD   Encounter Date: 04/07/2019  PT End of Session - 04/07/19 0914    Visit Number  1    Number of Visits  13    Date for PT Re-Evaluation  05/26/19    PT Start Time  0914    PT Stop Time  1000    PT Time Calculation (min)  46 min    Activity Tolerance  Patient tolerated treatment well    Behavior During Therapy  [facility] for tasks assessed/performed       Past Medical History:  Diagnosis Date  . Allergy   . Depression   . Family history of cervical cancer   . Family history of melanoma   . Family history of prostate cancer   . GERD (gastroesophageal reflux disease)   . Migraine     Past Surgical History:  Procedure Laterality Date  . ADENOIDECTOMY    . APPENDECTOMY    . BREAST LUMPECTOMY WITH RADIOACTIVE SEED AND SENTINEL LYMPH NODE BIOPSY Right 11/03/2018   Procedure: RIGHT BREAST LUMPECTOMY WITH RADIOACTIVE SEED AND RIGHT SENTINEL LYMPH NODE MAPPING;  Surgeon: Erroll Luna, MD;  Location: [place];  Service: General;  Laterality: Right;  . CESAREAN SECTION    . OPERATIVE ULTRASOUND Right 11/03/2018   Procedure: OPERATIVE ULTRASOUND;  Surgeon: Erroll Luna, MD;  Location: [place];  Service: General;  Laterality: Right;  . PORT-A-CATH REMOVAL N/A 12/09/2018   Procedure: PORT REMOVAL;  Surgeon: Erroll Luna, MD;  Location: [place];  Service: General;  Laterality: N/A;  . PORTACATH PLACEMENT Right 11/03/2018   Procedure: INSERTION PORT-A-CATH;  Surgeon: Erroll Luna, MD;  Location: [place];  Service: General;  Laterality: Right;    There were no vitals filed for this visit.   Subjective Assessment -  04/07/19 0921    Subjective  Pt report that she had a lumpectomy on 11/03/18 and then a second surgery to take her port out on 12/09/18 to remove her port. She started radiation on 12/28/18 and finished on 02/10/19. Around Christmas time of last year she started to notice a pain inferior to her R axilla and into the inferior portion of her R breast after she had small cold with a runny nose. She called her MD and was referred to physical therapy. She states that her pain is better in the morning but increases as the day progresses.    Pertinent History  57m mass with adjacent 330mmass in the right breast with no axillary adenopathy. 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 : 2 lymph node removal    Patient Stated Goals  I want to be able to manage this pain/swelling on my own.    Currently in Pain?  No/denies    Multiple Pain Sites  No         [facility] PT Assessment - 04/07/19 0001      Assessment   Medical Diagnosis  R breast cancer    Referring Provider (PT)  ViNicholas LoseD    Onset Date/Surgical Date  11/03/18    Hand Dominance  Right    Next MD Visit  05/11/19    Prior Therapy  None  Precautions   Precautions  Other (comment)   History of breast cancer      Balance Screen   Has the patient fallen in the past 6 months  No    Has the patient had a decrease in activity level because of a fear of falling?   Yes    Is the patient reluctant to leave their home because of a fear of falling?   No      Home Social worker  Private residence    Living Arrangements  Spouse/significant other;Children    Type of Home  House      Prior Function   Level of Independence  Independent    Vocation  Full time employment    Vocation Requirements  Pt works for [employer] is sitting and on the phone/computer a lot       Cognition   Overall Cognitive Status  Within Functional Limits for tasks assessed      Posture/Postural  Control   Posture/Postural Control  Postural limitations    Postural Limitations  Rounded Shoulders;Forward head      ROM / Strength   AROM / PROM / Strength  AROM      AROM   AROM Assessment Site  Shoulder    Right/Left Shoulder  Right;Left    Right Shoulder Flexion  168 Degrees    Right Shoulder ABduction  160 Degrees    Right Shoulder Internal Rotation  84 Degrees    Right Shoulder External Rotation  85 Degrees    Left Shoulder Flexion  176 Degrees    Left Shoulder ABduction  162 Degrees    Left Shoulder Internal Rotation  76 Degrees    Left Shoulder External Rotation  83 Degrees        LYMPHEDEMA/ONCOLOGY QUESTIONNAIRE - 04/07/19 0933      Type   Cancer Type  R breast cancer       Surgeries   Lumpectomy Date  11/03/18    Axillary Lymph Node Dissection Date  11/03/18    Other Surgery Date  12/09/18    Number Lymph Nodes Removed  2      Treatment   Active Chemotherapy Treatment  No    Past Chemotherapy Treatment  No    Active Radiation Treatment  No    Past Radiation Treatment  Yes    Date  02/10/19    Body Site  R axilla to R breast      What other symptoms do you have   Are you Having Heaviness or Tightness  Yes    Are you having Pain  Yes    Are you having pitting edema  No    Is it Hard or Difficult finding clothes that fit  No    Do you have infections  No    Is there Decreased scar mobility  Yes      Lymphedema Assessments   Lymphedema Assessments  Upper extremities      Right Upper Extremity Lymphedema   15 cm Proximal to Olecranon Process  43 cm    10 cm Proximal to Olecranon Process  38.5 cm    Olecranon Process  26 cm    15 cm Proximal to Ulnar Styloid Process  27 cm    10 cm Proximal to Ulnar Styloid Process  24.8 cm    Just Proximal to Ulnar Styloid Process  17 cm    Across Hand at [employer]  17.9 cm  At [facility] of 2nd Digit  6 cm      Left Upper Extremity Lymphedema   15 cm Proximal to Olecranon Process  42 cm    10 cm Proximal to  Olecranon Process  38.4 cm    Olecranon Process  26.5 cm    15 cm Proximal to Ulnar Styloid Process  26.8 cm    10 cm Proximal to Ulnar Styloid Process  24.5 cm    Just Proximal to Ulnar Styloid Process  15.9 cm    Across Hand at [employer]  17.8 cm    At [place] of 2nd Digit  5.9 cm    Other  axillary line 104 cm     Other  20 cm below axillary line 125 cm              [facility]patient Re[facility]07/[facility]  Lymphedema Life Impact Scale Total Score  20.59 %      Objective measurements completed on examination: See above findings.      [facility] Adult PT Treatment/Exercise - 04/07/19 0001      Exercises   Exercises  Other Exercises    Other Exercises   Lymphatic faclitation exercises with VC and demonstration for correct movement and to avoid pain. Cervical flexion/extension, R/L side bending and R/L rotation. Shoulder elevation/depressoin, scapular retraction, shoulder flexion, trunk side bending R/L, Hip extension R/L, Elbow flexion/extension, wrist flexion/extension and fist pumps over shoulder height 5x for each              PT Education - 04/07/19 1138    Education Details  Pt was educated on the anatomy and physiology of the lymphatic system. Discussed risk reduction practices including avoiding needle sticks and BP on the RUE. She was educated on complete decongestive therapy including exercise, skin care, manual lymph drainage and compression. Discussed compression bras and the difference between lymphedema and post operative swelling. Pt was provided with lymphatic faciltiation exercise sheet with demonstration and return demonstration    Person(s) Educated  Patient    Methods  Explanation;Demonstration;Handout    Comprehension  Verbalized understanding;Returned demonstration       PT Short Term Goals - 04/07/19 1146      PT SHORT TERM GOAL #1   Title  Pt will be independent with HEP and self MLD.    Baseline  Pt does  not have HEP or know how to perform self MLD.    Time  3    Period  Weeks    Status  New    Target Date  04/28/19        PT Long Term Goals - 04/07/19 1146      PT LONG TERM GOAL #1   Title  Pt will report 1/10 pain or less with 50% improvement in symptoms in the R breast for improve quality of life.    Baseline  4/10 pain intermittently.    Time  6    Period  Weeks    Status  New    Target Date  05/26/19      PT LONG TERM GOAL #2   Title  Patient to be properly fitted with compression garment to wear on daily basis.    Baseline  Pt does not have a compression garment.    Time  6    Period  Weeks    Status  New    Target Date  05/26/19      PT LONG TERM  GOAL #3   Title  Patient will be independent in prevention/self-care management principles including self-massage and long term management plan for edema.    Baseline  pt is unaware of lymphedema management or risk reduction precautions    Time  6    Period  Weeks    Status  New    Target Date  05/26/19             Plan - 04/07/19 1141    Clinical Impression Statement  Pt presents to physical therapy status post lumpectomy in August of 2020 followed by radiation that she finished in November of 2020. She started to notice pain and edema in her R breast in December of 2020. She was referred to physical therapy by her physician. She has no significant edema noted in her RUE at this time with RUE circumferential measurements comparative to the L UE circumferential measurements. Pt is reporting shooting pains in her breast and on visual inspection from the posterior aspect she does demonstrate increased size in the upper fold inferior to her R axilla when compared to her L. Due to radiation pt has an increased risk for lymphedema. Pt will benefit form skilled physical therapy services in order to decrease risk for increased edema and infection related to edema to manage swelling at home.    Personal Factors and Comorbidities   Age;Comorbidity 1    Comorbidities  Hx radiation    Stability/Clinical Decision Making  Stable/Uncomplicated    Clinical Decision Making  Low    Rehab Potential  Excellent    PT Frequency  2x / week   due to pt job she may only be able to come 1x some weeks and 2x other weeks.   PT Duration  6 weeks    PT Treatment/Interventions  ADLs/Self Care Home Management;Iontophoresis 15m/ml Dexamethasone;Moist Heat;Electrical Stimulation;Therapeutic exercise;Therapeutic activities;Functional mobility training;Neuromuscular re-education;Manual techniques;Manual lymph drainage;Compression bandaging    PT Next Visit Plan  Begin teaching self MLD. Pt may bring spouse.    PT Home Exercise Plan  Access Code: [email or web address]    Recommended Other Services  Compression bra and prophylactic sleeve    Consulted and Agree with Plan of Care  Patient       Patient will benefit from skilled therapeutic intervention in order to improve the following deficits and impairments:  Pain, Decreased knowledge of precautions, Decreased knowledge of use of DME, Increased edema  Visit Diagnosis: Malignant neoplasm of lower-outer quadrant of right breast of female, estrogen receptor negative ([place] - Plan: PT plan of care cert/re-cert  Lymphedema, not elsewhere classified - Plan: PT plan of care cert/re-cert     Problem List Patient Active Problem List   Diagnosis Date Noted  . Genetic testing 10/13/2018  . Family history of melanoma   . Family history of prostate cancer   . Family history of cervical cancer   . Malignant neoplasm of lower-outer quadrant of right breast of female, estrogen receptor negative ([place] 10/06/2018  . Obesity, Class II, BMI 35-39.9 02/02/2018  . Elevated LDL cholesterol level 01/05/2017  . Dermatitis 01/31/2016  . Migraine without aura 04/21/2011  . Esophageal reflux 04/21/2011  . Depression with anxiety 04/21/2011  . Major depressive disorder, single episode 04/21/2011    CAnder Purpura  PT 04/07/2019, 11:53 AM  [place] [address]Phone: [phone]  Fax:  [phone] Name: DSHAYLA HEMINGMRN: [id number] of Birth: 508-10-1975

## 2019-04-11 ENCOUNTER — Other Ambulatory Visit: Payer: Self-pay

## 2019-04-11 ENCOUNTER — Ambulatory Visit: Payer: 59

## 2019-04-11 DIAGNOSIS — I890 Lymphedema, not elsewhere classified: Secondary | ICD-10-CM

## 2019-04-11 DIAGNOSIS — C50511 Malignant neoplasm of lower-outer quadrant of right female breast: Secondary | ICD-10-CM

## 2019-04-11 NOTE — Therapy (Signed)
[place], [place], [address] Phone: [phone]   Fax:  [phone]  Physical Therapy Treatment  Patient Details  Name: [name] MRN: [id number] Date of Birth: Jun 11, 1975 Referring Provider (PT): Nicholas Lose MD   Encounter Date: 04/11/2019  PT End of Session - 04/11/19 1505    Visit Number  2    Number of Visits  13    Date for PT Re-Evaluation  05/26/19    PT Start Time  1401    PT Stop Time  1503    PT Time Calculation (min)  62 min    Activity Tolerance  Patient tolerated treatment well    Behavior During Therapy  [facility] for tasks assessed/performed       Past Medical History:  Diagnosis Date  . Allergy   . Depression   . Family history of cervical cancer   . Family history of melanoma   . Family history of prostate cancer   . GERD (gastroesophageal reflux disease)   . Migraine     Past Surgical History:  Procedure Laterality Date  . ADENOIDECTOMY    . APPENDECTOMY    . BREAST LUMPECTOMY WITH RADIOACTIVE SEED AND SENTINEL LYMPH NODE BIOPSY Right 11/03/2018   Procedure: RIGHT BREAST LUMPECTOMY WITH RADIOACTIVE SEED AND RIGHT SENTINEL LYMPH NODE MAPPING;  Surgeon: Erroll Luna, MD;  Location: [place];  Service: General;  Laterality: Right;  . CESAREAN SECTION    . OPERATIVE ULTRASOUND Right 11/03/2018   Procedure: OPERATIVE ULTRASOUND;  Surgeon: Erroll Luna, MD;  Location: [place];  Service: General;  Laterality: Right;  . PORT-A-CATH REMOVAL N/A 12/09/2018   Procedure: PORT REMOVAL;  Surgeon: Erroll Luna, MD;  Location: [place];  Service: General;  Laterality: N/A;  . PORTACATH PLACEMENT Right 11/03/2018   Procedure: INSERTION PORT-A-CATH;  Surgeon: Erroll Luna, MD;  Location: [place];  Service: General;  Laterality: Right;    There were no vitals filed for this visit.  Subjective Assessment -  04/11/19 1403    Subjective  Nothing new since evaluation last week.    Pertinent History  77m mass with adjacent 362mmass in the right breast with no axillary adenopathy. 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 : 2 lymph node removal    Patient Stated Goals  I want to be able to manage this pain/swelling on my own.    Currently in Pain?  No/denies                  Outpatient Re[facility] from [facility]07/[facility]  Lymphedema Life Impact Scale Total Score  20.59 %           [facility] Adult PT Treatment/Exercise - 04/11/19 0001      Manual Therapy   Manual Therapy  Manual Lymphatic Drainage (MLD)    Manual Lymphatic Drainage (MLD)  In Supine: Short neck, 5 diaphragmatic breaths, Rt inguinal and Lt axillary nodes, Rt axillo-inguinal and anterior inter-axillary anastmosis, then Rt superior breast redirecting towards anterior inter-axillary anastomsis, then into Lt S/L for Rt lateral breast redirecting fluid to Rt axillo-inguinal and posterior inter-axillary anastomosis, then finished retracing initial anastomosis in supine. Began instructing pt and husband in this while having husband return demonstration, using hand over hand pressure to teach correct light pressure. Husband was able to return good demo with pt reporting correct light pressure as well.  PT Education - 04/11/19 1522    Education Details  Self manual lymph drainage to Rt breast    Person(s) Educated  Patient;Spouse    Methods  Explanation;Demonstration;Handout    Comprehension  Verbalized understanding;Returned demonstration;Verbal cues required;Tactile cues required;Need further instruction       PT Short Term Goals - 04/07/19 1146      PT SHORT TERM GOAL #1   Title  Pt will be independent with HEP and self MLD.    Baseline  Pt does not have HEP or know how to perform self MLD.    Time  3    Period  Weeks     Status  New    Target Date  04/28/19        PT Long Term Goals - 04/07/19 1146      PT LONG TERM GOAL #1   Title  Pt will report 1/10 pain or less with 50% improvement in symptoms in the R breast for improve quality of life.    Baseline  4/10 pain intermittently.    Time  6    Period  Weeks    Status  New    Target Date  05/26/19      PT LONG TERM GOAL #2   Title  Patient to be properly fitted with compression garment to wear on daily basis.    Baseline  Pt does not have a compression garment.    Time  6    Period  Weeks    Status  New    Target Date  05/26/19      PT LONG TERM GOAL #3   Title  Patient will be independent in prevention/self-care management principles including self-massage and long term management plan for edema.    Baseline  pt is unaware of lymphedema management or risk reduction precautions    Time  6    Period  Weeks    Status  New    Target Date  05/26/19            Plan - 04/11/19 1506    Clinical Impression Statement  Pt and husband presented to phsical therapy today to be instructed in manual lymph drainage to Rt breast. After educating both on basic principles of manual lymph drainage instructed both while perfomring on correct technique and sequence. Had husband return demonstration of each step while encouraging pt to instruct him if pressure correct. Very good demo returned by husband after hand over hand presure given by therapist to teach light pressure. Pt able to report some improvement noted in heaviness of breast by end of session. Handout issued for this.    Personal Factors and Comorbidities  Age;Comorbidity 1    Comorbidities  Hx radiation    Stability/Clinical Decision Making  Stable/Uncomplicated    Rehab Potential  Excellent    PT Frequency  2x / week   due to work may only be able to come 1x/wk some weeks   PT Duration  6 weeks    PT Treatment/Interventions  ADLs/Self Care Home Management;Iontophoresis 75m/ml  Dexamethasone;Moist Heat;Electrical Stimulation;Therapeutic exercise;Therapeutic activities;Functional mobility training;Neuromuscular re-education;Manual techniques;Manual lymph drainage;Compression bandaging    PT Next Visit Plan  Continue manual lymph drainage to Rt breast while reviewing with pt (and spouse if he needs to return for review).    PT Home Exercise Plan  Access Code: [email or web address] self manual lymph drainage    Consulted and Agree with Plan of Care  Patient  Patient will benefit from skilled therapeutic intervention in order to improve the following deficits and impairments:  Pain, Decreased knowledge of precautions, Decreased knowledge of use of DME, Increased edema  Visit Diagnosis: Malignant neoplasm of lower-outer quadrant of right breast of female, estrogen receptor negative ([place])  Lymphedema, not elsewhere classified     Problem List Patient Active Problem List   Diagnosis Date Noted  . Genetic testing 10/13/2018  . Family history of melanoma   . Family history of prostate cancer   . Family history of cervical cancer   . Malignant neoplasm of lower-outer quadrant of right breast of female, estrogen receptor negative ([place]) 10/06/2018  . Obesity, Class II, BMI 35-39.9 02/02/2018  . Elevated LDL cholesterol level 01/05/2017  . Dermatitis 01/31/2016  . Migraine without aura 04/21/2011  . Esophageal reflux 04/21/2011  . Depression with anxiety 04/21/2011  . Major depressive disorder, single episode 04/21/2011    Otelia Limes, PTA 04/11/2019, 3:23 PM  [place], [place], [address] Phone: [phone]   Fax:  [phone]  Name: [name] MRN: [id number] Date of Birth: 1975-11-29

## 2019-04-11 NOTE — Patient Instructions (Signed)
Self manual lymph drainage:    Cancer Rehab [phone] Perform this sequence once a day.  Only give enough pressure no your skin to make the skin move.  Hug yourself.  Do circles at your neck just above your collarbones.  Repeat this 10 times.  Diaphragmatic - Supine   Inhale through nose making navel move out toward hands. Exhale through puckered lips, hands follow navel in. Repeat _5__ times. Rest _10__ seconds between repeats.   Axilla - One at a Time   Using full weight of flat hand and fingers at center of uninvolved armpit, make _10__ in-place circles.   LEG: Inguinal Nodes Stimulation   With small finger side of hand against hip crease on involved side, gently perform circles at the crease. Repeat __10_ times.   1) Axilla to Inguinal Nodes - Sweep   On involved side, pump _4__ times from armpit along side of trunk to hip crease.  Now gently stretch skin from the involved side to the uninvolved side across the chest at the shoulder line.  Repeat that 4 times.  Draw an imaginary diagonal line from upper outer breast through the nipple area toward lower inner breast.  Direct fluid upward and inward from this line toward the pathway across your upper chest .  Do this in three rows to treat all of the upper inner breast tissue, and do each row 3-4x.      Direct fluid to treat all of lower outer breast tissue downward and outward toward  pathway that is aimed at the left groin.  Finish by doing the pathways as described above going from your involved armpit to the same side groin and going across your upper chest from the involved shoulder to the uninvolved shoulder.  Repeat the steps above where you do circles in your right groin and left armpit.

## 2019-04-13 ENCOUNTER — Ambulatory Visit: Payer: 59

## 2019-04-13 ENCOUNTER — Other Ambulatory Visit: Payer: Self-pay

## 2019-04-13 DIAGNOSIS — I890 Lymphedema, not elsewhere classified: Secondary | ICD-10-CM

## 2019-04-13 DIAGNOSIS — C50511 Malignant neoplasm of lower-outer quadrant of right female breast: Secondary | ICD-10-CM | POA: Diagnosis not present

## 2019-04-13 NOTE — Therapy (Signed)
[place], [place], [address] Phone: [phone]   Fax:  [phone]  Physical Therapy Treatment  Patient Details  Name: [name] MRN: [id number] Date of Birth: 12/19/1975 Referring Provider (PT): Nicholas Lose MD   Encounter Date: 04/13/2019  PT End of Session - 04/13/19 1001    Visit Number  3    Number of Visits  13    Date for PT Re-Evaluation  05/26/19    PT Start Time  0859    PT Stop Time  0959    PT Time Calculation (min)  60 min    Activity Tolerance  Patient tolerated treatment well    Behavior During Therapy  [facility] for tasks assessed/performed       Past Medical History:  Diagnosis Date  . Allergy   . Depression   . Family history of cervical cancer   . Family history of melanoma   . Family history of prostate cancer   . GERD (gastroesophageal reflux disease)   . Migraine     Past Surgical History:  Procedure Laterality Date  . ADENOIDECTOMY    . APPENDECTOMY    . BREAST LUMPECTOMY WITH RADIOACTIVE SEED AND SENTINEL LYMPH NODE BIOPSY Right 11/03/2018   Procedure: RIGHT BREAST LUMPECTOMY WITH RADIOACTIVE SEED AND RIGHT SENTINEL LYMPH NODE MAPPING;  Surgeon: Erroll Luna, MD;  Location: [place];  Service: General;  Laterality: Right;  . CESAREAN SECTION    . OPERATIVE ULTRASOUND Right 11/03/2018   Procedure: OPERATIVE ULTRASOUND;  Surgeon: Erroll Luna, MD;  Location: [place];  Service: General;  Laterality: Right;  . PORT-A-CATH REMOVAL N/A 12/09/2018   Procedure: PORT REMOVAL;  Surgeon: Erroll Luna, MD;  Location: [place];  Service: General;  Laterality: N/A;  . PORTACATH PLACEMENT Right 11/03/2018   Procedure: INSERTION PORT-A-CATH;  Surgeon: Erroll Luna, MD;  Location: [place];  Service: General;  Laterality: Right;    There were no vitals filed for this visit.  Subjective Assessment -  04/13/19 0903    Subjective  I felt a little after last session, the fullness of my Rt breast was less.    Pertinent History  22m mass with adjacent 386mmass in the right breast with no axillary adenopathy. 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 : 2 lymph node removal    Patient Stated Goals  I want to be able to manage this pain/swelling on my own.    Currently in Pain?  No/denies                  Outpatient Re[facility]07/[facility]  Lymphedema Life Impact Scale Total Score  20.59 %           [facility] Adult PT Treatment/Exercise - 04/13/19 0001      Manual Therapy   Manual Therapy  Manual Lymphatic Drainage (MLD)    Manual therapy comments  Issued 1/2" gray foam in thin stockinette for lateral aspect of breat    Manual Lymphatic Drainage (MLD)  In Supine: Short neck, 5 diaphragmatic breaths, Rt inguinal and Lt axillary nodes, Rt axillo-inguinal and anterior inter-axillary anastmosis, then Rt superior breast redirecting towards anterior inter-axillary anastomsis, then into Lt S/L for Rt lateral breast redirecting fluid to Rt axillo-inguinal and posterior inter-axillary anastomosis, then finished retracing initial anastomosis in supine reviewing technique with pt througout  PT Short Term Goals - 04/07/19 1146      PT SHORT TERM GOAL #1   Title  Pt will be independent with HEP and self MLD.    Baseline  Pt does not have HEP or know how to perform self MLD.    Time  3    Period  Weeks    Status  New    Target Date  04/28/19        PT Long Term Goals - 04/07/19 1146      PT LONG TERM GOAL #1   Title  Pt will report 1/10 pain or less with 50% improvement in symptoms in the R breast for improve quality of life.    Baseline  4/10 pain intermittently.    Time  6    Period  Weeks    Status  New    Target Date  05/26/19      PT LONG TERM GOAL #2   Title   Patient to be properly fitted with compression garment to wear on daily basis.    Baseline  Pt does not have a compression garment.    Time  6    Period  Weeks    Status  New    Target Date  05/26/19      PT LONG TERM GOAL #3   Title  Patient will be independent in prevention/self-care management principles including self-massage and long term management plan for edema.    Baseline  pt is unaware of lymphedema management or risk reduction precautions    Time  6    Period  Weeks    Status  New    Target Date  05/26/19            Plan - 04/13/19 1001    Clinical Impression Statement  Continued with manual lymph drainage today reviewing technique with pt throughout. She is able to return good demo after hand over hand pressure for lighter pressure and no slide, just stretch skin. Good softening noted at inferior and lateral aspects of her breast today from last session. Issued 1/2" gray compression foam for pt to wear at lateral aspect of her breast in her bra.    Personal Factors and Comorbidities  Age;Comorbidity 1    Comorbidities  Hx radiation    Stability/Clinical Decision Making  Stable/Uncomplicated    Rehab Potential  Excellent    PT Frequency  2x / week   due to work may only be able to come 1x/week some weeks   PT Duration  6 weeks    PT Treatment/Interventions  ADLs/Self Care Home Management;Iontophoresis 74m/ml Dexamethasone;Moist Heat;Electrical Stimulation;Therapeutic exercise;Therapeutic activities;Functional mobility training;Neuromuscular re-education;Manual techniques;Manual lymph drainage;Compression bandaging    PT Next Visit Plan  Continue manual lymph drainage to Rt breast while reviewing with pt (and spouse if he needs to return for review).    PT Home Exercise Plan  Access Code: [email or web address] self manual lymph drainage    Consulted and Agree with Plan of Care  Patient       Patient will benefit from skilled therapeutic intervention in order to improve the  following deficits and impairments:  Pain, Decreased knowledge of precautions, Decreased knowledge of use of DME, Increased edema  Visit Diagnosis: Malignant neoplasm of lower-outer quadrant of right breast of female, estrogen receptor negative ([place]  Lymphedema, not elsewhere classified     Problem List Patient Active Problem List   Diagnosis Date Noted  . Genetic testing 10/13/2018  . Family history  of melanoma   . Family history of prostate cancer   . Family history of cervical cancer   . Malignant neoplasm of lower-outer quadrant of right breast of female, estrogen receptor negative ([place]) 10/06/2018  . Obesity, Class II, BMI 35-39.9 02/02/2018  . Elevated LDL cholesterol level 01/05/2017  . Dermatitis 01/31/2016  . Migraine without aura 04/21/2011  . Esophageal reflux 04/21/2011  . Depression with anxiety 04/21/2011  . Major depressive disorder, single episode 04/21/2011    Otelia Limes, PTA 04/13/2019, 12:04 PM  [place], [place], [address] Phone: [phone]   Fax:  [phone]  Name: [name] MRN: [id number] Date of Birth: [birth year]

## 2019-04-20 ENCOUNTER — Other Ambulatory Visit: Payer: Self-pay

## 2019-04-20 ENCOUNTER — Ambulatory Visit: Payer: 59

## 2019-04-20 DIAGNOSIS — I890 Lymphedema, not elsewhere classified: Secondary | ICD-10-CM

## 2019-04-20 DIAGNOSIS — C50511 Malignant neoplasm of lower-outer quadrant of right female breast: Secondary | ICD-10-CM

## 2019-04-20 NOTE — Therapy (Signed)
[place], [place], [address] Phone: [phone]   Fax:  [phone]  Physical Therapy Treatment  Patient Details  Name: [name] MRN: [id number] Date of Birth: 11-11-1975 Referring Provider (PT): Nicholas Lose MD   Encounter Date: 04/20/2019  PT End of Session - 04/20/19 0910    Visit Number  4    Number of Visits  13    Date for PT Re-Evaluation  05/26/19    PT Start Time  0904    PT Stop Time  0958    PT Time Calculation (min)  54 min    Activity Tolerance  Patient tolerated treatment well    Behavior During Therapy  [facility] for tasks assessed/performed       Past Medical History:  Diagnosis Date  . Allergy   . Depression   . Family history of cervical cancer   . Family history of melanoma   . Family history of prostate cancer   . GERD (gastroesophageal reflux disease)   . Migraine     Past Surgical History:  Procedure Laterality Date  . ADENOIDECTOMY    . APPENDECTOMY    . BREAST LUMPECTOMY WITH RADIOACTIVE SEED AND SENTINEL LYMPH NODE BIOPSY Right 11/03/2018   Procedure: RIGHT BREAST LUMPECTOMY WITH RADIOACTIVE SEED AND RIGHT SENTINEL LYMPH NODE MAPPING;  Surgeon: Erroll Luna, MD;  Location: [place];  Service: General;  Laterality: Right;  . CESAREAN SECTION    . OPERATIVE ULTRASOUND Right 11/03/2018   Procedure: OPERATIVE ULTRASOUND;  Surgeon: Erroll Luna, MD;  Location: [place];  Service: General;  Laterality: Right;  . PORT-A-CATH REMOVAL N/A 12/09/2018   Procedure: PORT REMOVAL;  Surgeon: Erroll Luna, MD;  Location: [place];  Service: General;  Laterality: N/A;  . PORTACATH PLACEMENT Right 11/03/2018   Procedure: INSERTION PORT-A-CATH;  Surgeon: Erroll Luna, MD;  Location: [place];  Service: General;  Laterality: Right;    There were no vitals filed for this visit.  Subjective Assessment -  04/20/19 0910    Subjective  Pt reports that she has not been doing the massage every day but when she does it seems to help especially at night.    Pertinent History  25m mass with adjacent 384mmass in the right breast with no axillary adenopathy. 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 : 2 lymph node removal    Patient Stated Goals  I want to be able to manage this pain/swelling on my own.    Currently in Pain?  No/denies                  Outpatient Rehab from 04/07/2019 [facility]  Lymphedema Life Impact Scale Total Score  20.59 %           [facility] Adult PT Treatment/Exercise - 04/20/19 0001      Manual Therapy   Manual Therapy  Manual Lymphatic Drainage (MLD)    Manual Lymphatic Drainage (MLD)  In Supine: Short neck, 5 diaphragmatic breaths, Rt inguinal and Bil axillary nodes, Rt axillo-inguinal and anterior inter-axillary anastmosis, then Rt superior breast redirecting towards anterior inter-axillary anastomsis, Anteiror inter-axillary anastomosis 5x, inferior breast toward R axillo-inguinal anastomosis 5x, then into Lt S/L for Rt lateral breast redirecting fluid to Rt axillo-inguinal and posterior inter-axillary anastomosis, then finished retracing initial anastomosis in supine and all previously worked nodes.  PT Education - 04/20/19 0949    Education Details  Pt will continue with exercises and MLD at home. Educated pt on use of compression bra an prophylactic sleeve and discussed healing time following radiaiton therapy.    Person(s) Educated  Patient    Methods  Explanation    Comprehension  Verbalized understanding       PT Short Term Goals - 04/07/19 1146      PT SHORT TERM GOAL #1   Title  Pt will be independent with HEP and self MLD.    Baseline  Pt does not have HEP or know how to perform self MLD.    Time  3    Period  Weeks    Status  New    Target  Date  04/28/19        PT Long Term Goals - 04/07/19 1146      PT LONG TERM GOAL #1   Title  Pt will report 1/10 pain or less with 50% improvement in symptoms in the R breast for improve quality of life.    Baseline  4/10 pain intermittently.    Time  6    Period  Weeks    Status  New    Target Date  05/26/19      PT LONG TERM GOAL #2   Title  Patient to be properly fitted with compression garment to wear on daily basis.    Baseline  Pt does not have a compression garment.    Time  6    Period  Weeks    Status  New    Target Date  05/26/19      PT LONG TERM GOAL #3   Title  Patient will be independent in prevention/self-care management principles including self-massage and long term management plan for edema.    Baseline  pt is unaware of lymphedema management or risk reduction precautions    Time  6    Period  Weeks    Status  New    Target Date  05/26/19            Plan - 04/20/19 0909    Clinical Impression Statement  Pt continues with edema noted in her R breast and inferior to her R axilla. MLD continued in this area this session with extra time spent at the R axillo-inguinal anastomosis and at the inferior breast. Creping noted at the end of session in the R breast with equal/Bilateral folds below Bil axilla demonstrated decrease in edema on the R side. Discussed compression bra and foam with patient as well as healing time following radiation therapy within the physical therapy scope of practice. She was set up for an appointment to be measuremed by Melissa for a prophylactic sleeve and compression bra. Pt will benefit from continued POC at this time.    Personal Factors and Comorbidities  Age;Comorbidity 1    Comorbidities  Hx radiation    Rehab Potential  Excellent    PT Frequency  2x / week    PT Duration  6 weeks    PT Treatment/Interventions  ADLs/Self Care Home Management;Iontophoresis 4mg/ml Dexamethasone;Moist Heat;Electrical Stimulation;Therapeutic  exercise;Therapeutic activities;Functional mobility training;Neuromuscular re-education;Manual techniques;Manual lymph drainage;Compression bandaging    PT Next Visit Plan  Continue manual lymph drainage to Rt breast while reviewing with pt (and spouse if he needs to return for review). check for script    PT Home Exercise Plan  Access Code: [email or web address]; self manual lymph drainage      Consulted and Agree with Plan of Care  Patient       Patient will benefit from skilled therapeutic intervention in order to improve the following deficits and impairments:  Pain, Decreased knowledge of precautions, Decreased knowledge of use of DME, Increased edema  Visit Diagnosis: Lymphedema, not elsewhere classified  Malignant neoplasm of lower-outer quadrant of right breast of female, estrogen receptor negative ([facility])     Problem List Patient Active Problem List   Diagnosis Date Noted  . Genetic testing 10/13/2018  . Family history of melanoma   . Family history of prostate cancer   . Family history of cervical cancer   . Malignant neoplasm of lower-outer quadrant of right breast of female, estrogen receptor negative ([facility]) 10/06/2018  . Obesity, Class II, BMI 35-39.9 02/02/2018  . Elevated LDL cholesterol level 01/05/2017  . Dermatitis 01/31/2016  . Migraine without aura 04/21/2011  . Esophageal reflux 04/21/2011  . Depression with anxiety 04/21/2011  . Major depressive disorder, single episode 04/21/2011    Catherine H Healy, PT 04/20/2019, 10:42 AM  [place] [facility] [address] [place], [place], [address] Phone: [phone]   Fax:  [phone]  Name: [name] MRN: [id number] Date of Birth: 02/21/1976   

## 2019-04-21 ENCOUNTER — Ambulatory Visit: Payer: 59

## 2019-04-21 DIAGNOSIS — I890 Lymphedema, not elsewhere classified: Secondary | ICD-10-CM

## 2019-04-21 DIAGNOSIS — Z171 Estrogen receptor negative status [ER-]: Secondary | ICD-10-CM

## 2019-04-21 DIAGNOSIS — C50511 Malignant neoplasm of lower-outer quadrant of right female breast: Secondary | ICD-10-CM | POA: Diagnosis not present

## 2019-04-21 NOTE — Therapy (Signed)
[place], [place], [address] Phone: [phone]   Fax:  [phone]  Physical Therapy Treatment  Patient Details  Name: [name] MRN: [id number] Date of Birth: 07-29-1975 Referring Provider (PT): Nicholas Lose MD   Encounter Date: 04/21/2019  PT End of Session - 04/21/19 1005    Visit Number  5    Number of Visits  13    Date for PT Re-Evaluation  05/26/19    PT Start Time  0902    PT Stop Time  1004    PT Time Calculation (min)  62 min    Activity Tolerance  Patient tolerated treatment well    Behavior During Therapy  [facility] for tasks assessed/performed       Past Medical History:  Diagnosis Date  . Allergy   . Depression   . Family history of cervical cancer   . Family history of melanoma   . Family history of prostate cancer   . GERD (gastroesophageal reflux disease)   . Migraine     Past Surgical History:  Procedure Laterality Date  . ADENOIDECTOMY    . APPENDECTOMY    . BREAST LUMPECTOMY WITH RADIOACTIVE SEED AND SENTINEL LYMPH NODE BIOPSY Right 11/03/2018   Procedure: RIGHT BREAST LUMPECTOMY WITH RADIOACTIVE SEED AND RIGHT SENTINEL LYMPH NODE MAPPING;  Surgeon: Erroll Luna, MD;  Location: [place];  Service: General;  Laterality: Right;  . CESAREAN SECTION    . OPERATIVE ULTRASOUND Right 11/03/2018   Procedure: OPERATIVE ULTRASOUND;  Surgeon: Erroll Luna, MD;  Location: [place];  Service: General;  Laterality: Right;  . PORT-A-CATH REMOVAL N/A 12/09/2018   Procedure: PORT REMOVAL;  Surgeon: Erroll Luna, MD;  Location: [place];  Service: General;  Laterality: N/A;  . PORTACATH PLACEMENT Right 11/03/2018   Procedure: INSERTION PORT-A-CATH;  Surgeon: Erroll Luna, MD;  Location: [place];  Service: General;  Laterality: Right;    There were no vitals filed for this visit.  Subjective Assessment -  04/21/19 0904    Subjective  I was having nighttime pain at the lateral breast at the end of almost every day, and now it's only about 4x/week, mostly after my work days. And I can tell an improvement when I have my pain after I do the self MLD.    Pertinent History  11m mass with adjacent 331mmass in the right breast with no axillary adenopathy. 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 : 2 lymph node removal    Patient Stated Goals  I want to be able to manage this pain/swelling on my own.    Currently in Pain?  No/denies                  Outpatient Rehab from [facility]/07/2019 [facility]  Lymphedema Life Impact Scale Total Score  20.59 %           [facility] Adult PT Treatment/Exercise - 04/21/19 0001      Shoulder Exercises: Supine   Horizontal ABduction  Strengthening;Both;5 reps;Theraband    Theraband Level (Shoulder Horizontal ABduction)  Level 2 (Red)    External Rotation  Strengthening;Both;5 reps;Theraband    Theraband Level (Shoulder External Rotation)  Level 2 (Red)    Flexion  Strengthening;Both;5 reps;Theraband   Narrow and Wide Grip, 5 times each   Theraband Level (Shoulder Flexion)  Level 2 (Red)  Diagonals  Strengthening;Right;Left;5 reps;Theraband    Theraband Level (Shoulder Diagonals)  Level 2 (Red)    Diagonals Limitations  Pt returned therapist demo of all with good technique      Manual Therapy   Manual Therapy  Manual Lymphatic Drainage (MLD)    Manual Lymphatic Drainage (MLD)  In Supine: Short neck, superficial and deep abdominals, Rt inguinal and Bil axillary nodes, Rt axillo-inguinal and anterior inter-axillary anastmosis, then Rt superior breast redirecting towards anterior inter-axillary anastomsis, Anteiror inter-axillary anastomosis 5x, inferior breast toward R axillo-inguinal anastomosis 5x, then into Lt S/L for Rt lateral breast redirecting fluid to Rt axillo-inguinal and  posterior inter-axillary anastomosis, then finished retracing initial anastomosis in supine and all previously worked nodes.              PT Education - 04/21/19 0917    Education Details  Supine scapular series with red theraband    Person(s) Educated  Patient    Methods  Explanation;Demonstration;Handout    Comprehension  Verbalized understanding;Returned demonstration;Need further instruction       PT Short Term Goals - 04/07/19 1146      PT SHORT TERM GOAL #1   Title  Pt will be independent with HEP and self MLD.    Baseline  Pt does not have HEP or know how to perform self MLD.    Time  3    Period  Weeks    Status  New    Target Date  04/28/19        PT Long Term Goals - 04/07/19 1146      PT LONG TERM GOAL #1   Title  Pt will report 1/10 pain or less with 50% improvement in symptoms in the R breast for improve quality of life.    Baseline  4/10 pain intermittently.    Time  6    Period  Weeks    Status  New    Target Date  05/26/19      PT LONG TERM GOAL #2   Title  Patient to be properly fitted with compression garment to wear on daily basis.    Baseline  Pt does not have a compression garment.    Time  6    Period  Weeks    Status  New    Target Date  05/26/19      PT LONG TERM GOAL #3   Title  Patient will be independent in prevention/self-care management principles including self-massage and long term management plan for edema.    Baseline  pt is unaware of lymphedema management or risk reduction precautions    Time  6    Period  Weeks    Status  New    Target Date  05/26/19            Plan - 04/21/19 1230    Clinical Impression Statement  Pts lymphedema at inferior Rt breast continues to be present but softens very well at inferior breast by end of session, especially when pt in Lt S/L. She repots that pain she was experiencing at lateral breast at end of work days that made her call about PT initially was daily, but now maybe only 4  days/week noting improvement with this. Added supine scapular series to HEP as pt reports having trouble with posture more so since radiationa nd lymphedema. Also instructed her how she can do some of these at work in her chair (ER, horz abd, and D2) and she verbalized understanding and  returned good demo of each. Script for compression has yet to be returned.    Personal Factors and Comorbidities  Age;Comorbidity 1    Comorbidities  Hx radiation    Stability/Clinical Decision Making  Stable/Uncomplicated    Rehab Potential  Excellent    PT Frequency  2x / week    PT Duration  6 weeks    PT Treatment/Interventions  ADLs/Self Care Home Management;Iontophoresis 18m/ml Dexamethasone;Moist Heat;Electrical Stimulation;Therapeutic exercise;Therapeutic activities;Functional mobility training;Neuromuscular re-education;Manual techniques;Manual lymph drainage;Compression bandaging    PT Next Visit Plan  Continue manual lymph drainage to Rt breast while reviewing with pt (and spouse if he needs to return for review). check for script    PT Home Exercise Plan  Access Code: [email or web address]; self manual lymph drainage    Consulted and Agree with Plan of Care  Patient       Patient will benefit from skilled therapeutic intervention in order to improve the following deficits and impairments:  Pain, Decreased knowledge of precautions, Decreased knowledge of use of DME, Increased edema  Visit Diagnosis: Lymphedema, not elsewhere classified  Malignant neoplasm of lower-outer quadrant of right breast of female, estrogen receptor negative ([place]     Problem List Patient Active Problem List   Diagnosis Date Noted  . Genetic testing 10/13/2018  . Family history of melanoma   . Family history of prostate cancer   . Family history of cervical cancer   . Malignant neoplasm of lower-outer quadrant of right breast of female, estrogen receptor negative ([place] 10/06/2018  . Obesity, Class II, BMI 35-39.9 02/02/2018  .  Elevated LDL cholesterol level 01/05/2017  . Dermatitis 01/31/2016  . Migraine without aura 04/21/2011  . Esophageal reflux 04/21/2011  . Depression with anxiety 04/21/2011  . Major depressive disorder, single episode 04/21/2011    ROtelia Limes PTA 04/21/2019, 12:36 PM  [place] [address]: [phone]  Fax:  [phone] Name: DEMMACLAIRE SWITALAMRN: [id number] of Birth: [birth year]

## 2019-04-21 NOTE — Patient Instructions (Signed)

## 2019-04-25 ENCOUNTER — Other Ambulatory Visit: Payer: Self-pay

## 2019-04-25 ENCOUNTER — Ambulatory Visit: Payer: 59

## 2019-04-25 DIAGNOSIS — I890 Lymphedema, not elsewhere classified: Secondary | ICD-10-CM

## 2019-04-25 DIAGNOSIS — C50511 Malignant neoplasm of lower-outer quadrant of right female breast: Secondary | ICD-10-CM | POA: Diagnosis not present

## 2019-04-25 DIAGNOSIS — Z171 Estrogen receptor negative status [ER-]: Secondary | ICD-10-CM

## 2019-04-25 NOTE — Therapy (Signed)
[facility] [address] [place], [place], [address] Phone: [phone]   Fax:  [phone]  Physical Therapy Treatment  Patient Details  Name: [name] MRN: [id number] Date of Birth: 01/26/1976 Referring Provider (PT): Vinay Gudena MD   Encounter Date: 04/25/2019  PT End of Session - 04/25/19 1005    Visit Number  6    Number of Visits  13    Date for PT Re-Evaluation  05/26/19    PT Start Time  1003    PT Stop Time  1057    PT Time Calculation (min)  54 min    Activity Tolerance  Patient tolerated treatment well    Behavior During Therapy  WFL for tasks assessed/performed       Past Medical History:  Diagnosis Date  . Allergy   . Depression   . Family history of cervical cancer   . Family history of melanoma   . Family history of prostate cancer   . GERD (gastroesophageal reflux disease)   . Migraine     Past Surgical History:  Procedure Laterality Date  . ADENOIDECTOMY    . APPENDECTOMY    . BREAST LUMPECTOMY WITH RADIOACTIVE SEED AND SENTINEL LYMPH NODE BIOPSY Right 11/03/2018   Procedure: RIGHT BREAST LUMPECTOMY WITH RADIOACTIVE SEED AND RIGHT SENTINEL LYMPH NODE MAPPING;  Surgeon: Cornett, Thomas, MD;  Location: [facility];  Service: General;  Laterality: Right;  . CESAREAN SECTION    . OPERATIVE ULTRASOUND Right 11/03/2018   Procedure: OPERATIVE ULTRASOUND;  Surgeon: Cornett, Thomas, MD;  Location: [facility];  Service: General;  Laterality: Right;  . PORT-A-CATH REMOVAL N/A 12/09/2018   Procedure: PORT REMOVAL;  Surgeon: Cornett, Thomas, MD;  Location: [facility];  Service: General;  Laterality: N/A;  . PORTACATH PLACEMENT Right 11/03/2018   Procedure: INSERTION PORT-A-CATH;  Surgeon: Cornett, Thomas, MD;  Location: [facility];  Service: General;  Laterality: Right;    There were no vitals filed for this visit.  Subjective Assessment -  04/25/19 1058    Subjective  Pt reports that she worked over the weekend and felt more discomfort in her R lateral breast/trunk area inferior to her R axilla due to increased edema in this area after being up for 12 hours during her shift.    Pertinent History  8mm mass with adjacent 3mm mass in the right breast with no axillary adenopathy. 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 : 2 lymph node removal    Patient Stated Goals  I want to be able to manage this pain/swelling on my own.    Currently in Pain?  No/denies    Multiple Pain Sites  No                  [facility]  Lymphedema Life Impact Scale Total Score  20.59 %           [facility] Adult PT Treatment/Exercise - 04/25/19 0001      Manual Therapy   Manual Therapy  Manual Lymphatic Drainage (MLD);Edema management    Edema Management  Pt was taped with edema tape over the lateral side of the R breast and down below the ribs on the R lateral trunk wall from just distal to the R axilla.     Manual Lymphatic Drainage (MLD)  In Supine: Short neck, superficial and deep abdominals, Rt   inguinal and Bil axillary nodes, Rt axillo-inguinal and anterior inter-axillary anastmosis, then Rt superior breast redirecting towards anterior inter-axillary anastomsis, Anteiror inter-axillary anastomosis 5x, inferior breast toward R axillo-inguinal anastomosis 5x, then into Lt S/L for Rt lateral breast redirecting fluid to Rt axillo-inguinal and posterior inter-axillary anastomosis, then finished retracing initial anastomosis in supine and all previously worked nodes. Deep abdominals             PT Education - 04/25/19 1100    Education Details  Pt was educated to continue to perform the scapular series. She was educated on use of edema taping while at work to see if this doesn't help decrease fullness in the R breast. Pt was  educated to remove the tape after no longer than 3 days and to not apply heat. She will remove immediately if she has any itching, bumps or redness around/near the tape. She will contact emergency services if she has any severe allergic reactions which pt is aware including shortness of breathe/difficulty breathing, rapid swelling.    Person(s) Educated  Patient    Methods  Explanation    Comprehension  Verbalized understanding       PT Short Term Goals - 04/07/19 1146      PT SHORT TERM GOAL #1   Title  Pt will be independent with HEP and self MLD.    Baseline  Pt does not have HEP or know how to perform self MLD.    Time  3    Period  Weeks    Status  New    Target Date  04/28/19        PT Long Term Goals - 04/07/19 1146      PT LONG TERM GOAL #1   Title  Pt will report 1/10 pain or less with 50% improvement in symptoms in the R breast for improve quality of life.    Baseline  4/10 pain intermittently.    Time  6    Period  Weeks    Status  New    Target Date  05/26/19      PT LONG TERM GOAL #2   Title  Patient to be properly fitted with compression garment to wear on daily basis.    Baseline  Pt does not have a compression garment.    Time  6    Period  Weeks    Status  New    Target Date  05/26/19      PT LONG TERM GOAL #3   Title  Patient will be independent in prevention/self-care management principles including self-massage and long term management plan for edema.    Baseline  pt is unaware of lymphedema management or risk reduction precautions    Time  6    Period  Weeks    Status  New    Target Date  05/26/19            Plan - 04/25/19 1005    Clinical Impression Statement  MLD was performed for the R breast with extra time spent in L side lying at the R axillo-inguinal and posterior inter-axillary anastomosis. Pt was taped with edema taping this session using 2 I fan strips covering the R lateral trunk wall distal to the R axilla and the latera/inferior  portion of the R breast. She was educated on reasons to remove the tape. Script for compression has not been returned and was re-sent today to Dr. Gudena for compression bra and prophylactic sleeve. Pt will benefit   from continued physical therapy services at this time.    Personal Factors and Comorbidities  Age;Comorbidity 1    Comorbidities  Hx radiation    Rehab Potential  Excellent    PT Frequency  2x / week    PT Duration  6 weeks    PT Treatment/Interventions  ADLs/Self Care Home Management;Iontophoresis 42m/ml Dexamethasone;Moist Heat;Electrical Stimulation;Therapeutic exercise;Therapeutic activities;Functional mobility training;Neuromuscular re-education;Manual techniques;Manual lymph drainage;Compression bandaging    PT Next Visit Plan  progress postural exercises and larger piece of foam possibly? Continue manual lymph drainage to Rt breast while reviewing with pt (and spouse if he needs to return for review). check for script, assess tape last session    PT Home Exercise Plan  Access Code: [email or web address]; self manual lymph drainage    Consulted and Agree with Plan of Care  Patient       Patient will benefit from skilled therapeutic intervention in order to improve the following deficits and impairments:  Pain, Decreased knowledge of precautions, Decreased knowledge of use of DME, Increased edema  Visit Diagnosis: Lymphedema, not elsewhere classified  Malignant neoplasm of lower-outer quadrant of right breast of female, estrogen receptor negative ([place]     Problem List Patient Active Problem List   Diagnosis Date Noted  . Genetic testing 10/13/2018  . Family history of melanoma   . Family history of prostate cancer   . Family history of cervical cancer   . Malignant neoplasm of lower-outer quadrant of right breast of female, estrogen receptor negative ([place] 10/06/2018  . Obesity, Class II, BMI 35-39.9 02/02/2018  . Elevated LDL cholesterol level 01/05/2017  . Dermatitis 01/31/2016   . Migraine without aura 04/21/2011  . Esophageal reflux 04/21/2011  . Depression with anxiety 04/21/2011  . Major depressive disorder, single episode 04/21/2011    CAnder Purpura PT 04/25/2019, 11:12 AM  [place] [address]Phone: [phone]  Fax:  [phone] Name: DKOURTLYN CHARLETMRN: [id number] of Birth: 524-Nov-1977

## 2019-04-29 ENCOUNTER — Other Ambulatory Visit: Payer: Self-pay

## 2019-04-29 ENCOUNTER — Ambulatory Visit: Payer: 59

## 2019-04-29 DIAGNOSIS — Z171 Estrogen receptor negative status [ER-]: Secondary | ICD-10-CM

## 2019-04-29 DIAGNOSIS — C50511 Malignant neoplasm of lower-outer quadrant of right female breast: Secondary | ICD-10-CM

## 2019-04-29 DIAGNOSIS — I890 Lymphedema, not elsewhere classified: Secondary | ICD-10-CM

## 2019-04-29 NOTE — Therapy (Signed)
[place], [place], [address] Phone: [phone]   Fax:  [phone]  Physical Therapy Treatment  Patient Details  Name: [name] MRN: [id number] Date of Birth: 23-Jan-1976 Referring Provider (PT): Nicholas Lose MD   Encounter Date: 04/29/2019  PT End of Session - 04/29/19 1008    Visit Number  7    Number of Visits  13    Date for PT Re-Evaluation  05/26/19    PT Start Time  1007    PT Stop Time  1100    PT Time Calculation (min)  53 min    Activity Tolerance  Patient tolerated treatment well    Behavior During Therapy  [facility] for tasks assessed/performed       Past Medical History:  Diagnosis Date  . Allergy   . Depression   . Family history of cervical cancer   . Family history of melanoma   . Family history of prostate cancer   . GERD (gastroesophageal reflux disease)   . Migraine     Past Surgical History:  Procedure Laterality Date  . ADENOIDECTOMY    . APPENDECTOMY    . BREAST LUMPECTOMY WITH RADIOACTIVE SEED AND SENTINEL LYMPH NODE BIOPSY Right 11/03/2018   Procedure: RIGHT BREAST LUMPECTOMY WITH RADIOACTIVE SEED AND RIGHT SENTINEL LYMPH NODE MAPPING;  Surgeon: Erroll Luna, MD;  Location: [facility];  Service: General;  Laterality: Right;  . CESAREAN SECTION    . OPERATIVE ULTRASOUND Right 11/03/2018   Procedure: OPERATIVE ULTRASOUND;  Surgeon: Erroll Luna, MD;  Location: [facility];  Service: General;  Laterality: Right;  . PORT-A-CATH REMOVAL N/A 12/09/2018   Procedure: PORT REMOVAL;  Surgeon: Erroll Luna, MD;  Location: [facility];  Service: General;  Laterality: N/A;  . PORTACATH PLACEMENT Right 11/03/2018   Procedure: INSERTION PORT-A-CATH;  Surgeon: Erroll Luna, MD;  Location: [facility];  Service: General;  Laterality: Right;    There were no vitals filed for this visit.  Subjective Assessment -  04/29/19 1012    Subjective  Pt reports that the tape did well and felt like she had less swelling when working on Wednesday. She states that there was some pink discolortation after removing the tape but no itching.    Pertinent History  32m mass with adjacent 370mmass in the right breast with no axillary adenopathy. 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 : 2 lymph node removal    Patient Stated Goals  I want to be able to manage this pain/swelling on my own.    Currently in Pain?  No/denies    Multiple Pain Sites  No                  [facility]  Lymphedema Life Impact Scale Total Score  20.59 %           [facility] Adult PT Treatment/Exercise - 04/29/19 0001      Manual Therapy   Manual Therapy  Manual Lymphatic Drainage (MLD);Edema management    Edema Management  Pt provided with gray foam 1/2 inch for R lateral trunk wal distal to axilla and lateral aspect of the R breast. Pt was measured for compression bra and sleeve [facility]   Manual Lymphatic Drainage (MLD)  In Supine: Short neck, superficial and deep abdominals, Rt inguinal  and Bil axillary nodes, Rt axillo-inguinal and anterior inter-axillary anastmosis, then Rt superior breast redirecting towards anterior inter-axillary anastomsis, Anteiror inter-axillary anastomosis 5x, inferior breast toward R axillo-inguinal anastomosis 5x, then into Lt S/L for Rt lateral breast redirecting fluid to Rt axillo-inguinal and posterior inter-axillary anastomosis, then finished retracing initial anastomosis in supine and all previously worked nodes. Deep abdominals             PT Education - 04/29/19 1240    Education Details  Pt will continue with exercises and self MLD at home. She will wear gray foam piece as tolerated.    Person(s) Educated  Patient    Methods  Explanation     Comprehension  Verbalized understanding       PT Short Term Goals - 04/07/19 1146      PT SHORT TERM GOAL #1   Title  Pt will be independent with HEP and self MLD.    Baseline  Pt does not have HEP or know how to perform self MLD.    Time  3    Period  Weeks    Status  New    Target Date  04/28/19        PT Long Term Goals - 04/07/19 1146      PT LONG TERM GOAL #1   Title  Pt will report 1/10 pain or less with 50% improvement in symptoms in the R breast for improve quality of life.    Baseline  4/10 pain intermittently.    Time  6    Period  Weeks    Status  New    Target Date  05/26/19      PT LONG TERM GOAL #2   Title  Patient to be properly fitted with compression garment to wear on daily basis.    Baseline  Pt does not have a compression garment.    Time  6    Period  Weeks    Status  New    Target Date  05/26/19      PT LONG TERM GOAL #3   Title  Patient will be independent in prevention/self-care management principles including self-massage and long term management plan for edema.    Baseline  pt is unaware of lymphedema management or risk reduction precautions    Time  6    Period  Weeks    Status  New    Target Date  05/26/19            Plan - 04/29/19 1008    Clinical Impression Statement  MLD was performed for the R breast with extra time spent in L side lying atthe R axillo-inguinal and posterior inter-axillary anastomosis. Pt was provided w/1/2 inch gray foam for the area distal to the R axilla and the lateral portion of the R breast to see if this helps with increased edema in this area. Pt will benefit from continued physical therapy services at this time.    Personal Factors and Comorbidities  Age;Comorbidity 1    Comorbidities  Hx radiation    Stability/Clinical Decision Making  Stable/Uncomplicated    Rehab Potential  Excellent    PT Frequency  2x / week    PT Duration  6 weeks    PT Treatment/Interventions  ADLs/Self Care Home  Management;Iontophoresis 28m/ml Dexamethasone;Moist Heat;Electrical Stimulation;Therapeutic exercise;Therapeutic activities;Functional mobility training;Neuromuscular re-education;Manual techniques;Manual lymph drainage;Compression bandaging    PT Next Visit Plan  progress postural exercises, How was larger piece of foam. Continue manual lymph  drainage to Rt breast while reviewing with pt (and spouse if he needs to return for review). check for script, assess tape last session    PT Home Exercise Plan  Access Code: [email or web address]; self manual lymph drainage    Consulted and Agree with Plan of Care  Patient       Patient will benefit from skilled therapeutic intervention in order to improve the following deficits and impairments:  Pain, Decreased knowledge of precautions, Decreased knowledge of use of DME, Increased edema  Visit Diagnosis: Lymphedema, not elsewhere classified  Malignant neoplasm of lower-outer quadrant of right breast of female, estrogen receptor negative ([place])     Problem List Patient Active Problem List   Diagnosis Date Noted  . Genetic testing 10/13/2018  . Family history of melanoma   . Family history of prostate cancer   . Family history of cervical cancer   . Malignant neoplasm of lower-outer quadrant of right breast of female, estrogen receptor negative ([place]) 10/06/2018  . Obesity, Class II, BMI 35-39.9 02/02/2018  . Elevated LDL cholesterol level 01/05/2017  . Dermatitis 01/31/2016  . Migraine without aura 04/21/2011  . Esophageal reflux 04/21/2011  . Depression with anxiety 04/21/2011  . Major depressive disorder, single episode 04/21/2011    Ander Purpura, PT 04/29/2019, 12:50 PM  [place], [place], [address] Phone: [phone]   Fax:  [phone]  Name: [name] MRN: [id number] Date of Birth: 04-04-1975

## 2019-05-04 ENCOUNTER — Ambulatory Visit: Payer: 59

## 2019-05-05 ENCOUNTER — Ambulatory Visit: Payer: 59 | Attending: Hematology and Oncology

## 2019-05-05 ENCOUNTER — Other Ambulatory Visit: Payer: Self-pay

## 2019-05-05 DIAGNOSIS — Z171 Estrogen receptor negative status [ER-]: Secondary | ICD-10-CM | POA: Insufficient documentation

## 2019-05-05 DIAGNOSIS — I890 Lymphedema, not elsewhere classified: Secondary | ICD-10-CM | POA: Diagnosis present

## 2019-05-05 DIAGNOSIS — C50511 Malignant neoplasm of lower-outer quadrant of right female breast: Secondary | ICD-10-CM | POA: Diagnosis present

## 2019-05-05 NOTE — Therapy (Signed)
[place], [place], [address] Phone: [phone]   Fax:  [phone]  Physical Therapy Treatment  Patient Details  Name: [name] MRN: [id number] Date of Birth: 10/01/1975 Referring Provider (PT): Nicholas Lose MD   Encounter Date: 05/05/2019  PT End of Session - 05/05/19 1002    Visit Number  8    Number of Visits  13    Date for PT Re-Evaluation  05/26/19    PT Start Time  1000    PT Stop Time  1053    PT Time Calculation (min)  53 min    Activity Tolerance  Patient tolerated treatment well    Behavior During Therapy  [facility] for tasks assessed/performed       Past Medical History:  Diagnosis Date  . Allergy   . Depression   . Family history of cervical cancer   . Family history of melanoma   . Family history of prostate cancer   . GERD (gastroesophageal reflux disease)   . Migraine     Past Surgical History:  Procedure Laterality Date  . ADENOIDECTOMY    . APPENDECTOMY    . BREAST LUMPECTOMY WITH RADIOACTIVE SEED AND SENTINEL LYMPH NODE BIOPSY Right 11/03/2018   Procedure: RIGHT BREAST LUMPECTOMY WITH RADIOACTIVE SEED AND RIGHT SENTINEL LYMPH NODE MAPPING;  Surgeon: Erroll Luna, MD;  Location: [place];  Service: General;  Laterality: Right;  . CESAREAN SECTION    . OPERATIVE ULTRASOUND Right 11/03/2018   Procedure: OPERATIVE ULTRASOUND;  Surgeon: Erroll Luna, MD;  Location: [place];  Service: General;  Laterality: Right;  . PORT-A-CATH REMOVAL N/A 12/09/2018   Procedure: PORT REMOVAL;  Surgeon: Erroll Luna, MD;  Location: [facility];  Service: General;  Laterality: N/A;  . PORTACATH PLACEMENT Right 11/03/2018   Procedure: INSERTION PORT-A-CATH;  Surgeon: Erroll Luna, MD;  Location: [place];  Service: General;  Laterality: Right;    There were no vitals filed for this visit.  Subjective Assessment -  05/05/19 1003    Subjective  Pt reports that yesterday she had some increased pain at night when she laid down to go to bed.    Pertinent History  29m mass with adjacent 327mmass in the right breast with no axillary adenopathy. 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 : 2 lymph node removal    Patient Stated Goals  I want to be able to manage this pain/swelling on my own.    Currently in Pain?  Yes    Pain Score  4     Pain Location  Breast    Pain Orientation  Right    Pain Descriptors / Indicators  Aching                  [facility]patient [facility]  Lymphedema Life Impact Scale Total Score  20.59 %           [facility] Adult PT Treatment/Exercise - 05/05/19 0001      Manual Therapy   Manual Therapy  Manual Lymphatic Drainage (MLD);Edema management    Edema Management  2 I strips fanned taped over the lateral trunk wall and lateral portion of the R breast for edema     Manual Lymphatic Drainage (MLD)  In Supine: Short neck, superficial and deep abdominals, Rt inguinal and Bil axillary nodes, Rt axillo-inguinal and anterior inter-axillary  anastmosis, then Rt superior breast redirecting towards anterior inter-axillary anastomsis, Anteiror inter-axillary anastomosis 5x, inferior breast toward R axillo-inguinal anastomosis 5x, then into Lt S/L for Rt lateral breast redirecting fluid to Rt axillo-inguinal and posterior inter-axillary anastomosis, then finished retracing initial anastomosis in supine and all previously worked nodes. Deep abdominals             PT Education - 05/05/19 1052    Education Details  Re-iterated on reasonsto remove tape and to remove after 3 days. She will continue to wear the gray foam piece as tolerated.    Person(s) Educated  Patient    Methods  Explanation    Comprehension  Verbalized understanding       PT Short Term Goals - 04/07/19 1146       PT SHORT TERM GOAL #1   Title  Pt will be independent with HEP and self MLD.    Baseline  Pt does not have HEP or know how to perform self MLD.    Time  3    Period  Weeks    Status  New    Target Date  04/28/19        PT Long Term Goals - 04/07/19 1146      PT LONG TERM GOAL #1   Title  Pt will report 1/10 pain or less with 50% improvement in symptoms in the R breast for improve quality of life.    Baseline  4/10 pain intermittently.    Time  6    Period  Weeks    Status  New    Target Date  05/26/19      PT LONG TERM GOAL #2   Title  Patient to be properly fitted with compression garment to wear on daily basis.    Baseline  Pt does not have a compression garment.    Time  6    Period  Weeks    Status  New    Target Date  05/26/19      PT LONG TERM GOAL #3   Title  Patient will be independent in prevention/self-care management principles including self-massage and long term management plan for edema.    Baseline  pt is unaware of lymphedema management or risk reduction precautions    Time  6    Period  Weeks    Status  New    Target Date  05/26/19            Plan - 05/05/19 1002    Clinical Impression Statement  Continued with MLD for the R breast with extra time spent at the inferior R breast and the R axillo-inguinal anastomosis both in supine and side-lying as well as from the lateral R breast toward the posterior inter-axillary anastomosis. Pt was taped with 2 I strips this session over lapping at the area inferior to the R axilla on the lateral trunk and over the lateral portion of the R breast. Dicussed wearing 1/2 inch gray foam in cami at night until she receives her compression bra due to pt reporting occasional discomfort at night. Pt will benefit from continued POC at this time.    Personal Factors and Comorbidities  Age;Comorbidity 1    Rehab Potential  Excellent    PT Frequency  2x / week    PT Duration  6 weeks    PT Treatment/Interventions   ADLs/Self Care Home Management;Iontophoresis 9m/ml Dexamethasone;Moist Heat;Electrical Stimulation;Therapeutic exercise;Therapeutic activities;Functional mobility training;Neuromuscular re-education;Manual techniques;Manual lymph drainage;Compression bandaging  PT Next Visit Plan  progress postural exercises. Continue manual lymph drainage to Rt breast while reviewing with pt (and spouse if he needs to return for review). check for script, assess tape last session    PT Home Exercise Plan  Access Code: [email or web address]; self manual lymph drainage    Consulted and Agree with Plan of Care  Patient       Patient will benefit from skilled therapeutic intervention in order to improve the following deficits and impairments:  Pain, Decreased knowledge of precautions, Decreased knowledge of use of DME, Increased edema  Visit Diagnosis: Lymphedema, not elsewhere classified  Malignant neoplasm of lower-outer quadrant of right breast of female, estrogen receptor negative ([place])     Problem List Patient Active Problem List   Diagnosis Date Noted  . Genetic testing 10/13/2018  . Family history of melanoma   . Family history of prostate cancer   . Family history of cervical cancer   . Malignant neoplasm of lower-outer quadrant of right breast of female, estrogen receptor negative ([place]) 10/06/2018  . Obesity, Class II, BMI 35-39.9 02/02/2018  . Elevated LDL cholesterol level 01/05/2017  . Dermatitis 01/31/2016  . Migraine without aura 04/21/2011  . Esophageal reflux 04/21/2011  . Depression with anxiety 04/21/2011  . Major depressive disorder, single episode 04/21/2011    Ander Purpura, PT 05/05/2019, 10:55 AM  [place], [place], [address] Phone: [phone]   Fax:  [phone]  Name: [name] MRN: [id number] Date of Birth: 18-Feb-1976

## 2019-05-09 ENCOUNTER — Ambulatory Visit: Payer: 59

## 2019-05-09 ENCOUNTER — Other Ambulatory Visit: Payer: Self-pay

## 2019-05-09 DIAGNOSIS — C50511 Malignant neoplasm of lower-outer quadrant of right female breast: Secondary | ICD-10-CM

## 2019-05-09 DIAGNOSIS — I890 Lymphedema, not elsewhere classified: Secondary | ICD-10-CM | POA: Diagnosis not present

## 2019-05-09 DIAGNOSIS — Z171 Estrogen receptor negative status [ER-]: Secondary | ICD-10-CM

## 2019-05-09 NOTE — Therapy (Signed)
[place], [place], [address] Phone: [phone]   Fax:  [phone]  Physical Therapy Treatment  Patient Details  Name: [name] MRN: [id number] Date of Birth: 07-22-1975 Referring Provider (PT): Kaitlyn Lose MD   Encounter Date: 05/09/2019  PT End of Session - 05/09/19 1504    Visit Number  9    Number of Visits  13    Date for PT Re-Evaluation  05/26/19    PT Start Time  4356    PT Stop Time  1503    PT Time Calculation (min)  58 min    Activity Tolerance  Patient tolerated treatment well    Behavior During Therapy  [facility] for tasks assessed/performed       Past Medical History:  Diagnosis Date  . Allergy   . Depression   . Family history of cervical cancer   . Family history of melanoma   . Family history of prostate cancer   . GERD (gastroesophageal reflux disease)   . Migraine     Past Surgical History:  Procedure Laterality Date  . ADENOIDECTOMY    . APPENDECTOMY    . BREAST LUMPECTOMY WITH RADIOACTIVE SEED AND SENTINEL LYMPH NODE BIOPSY Right 11/03/2018   Procedure: RIGHT BREAST LUMPECTOMY WITH RADIOACTIVE SEED AND RIGHT SENTINEL LYMPH NODE MAPPING;  Surgeon: Kaitlyn Luna, MD;  Location: [place];  Service: General;  Laterality: Right;  . CESAREAN SECTION    . OPERATIVE ULTRASOUND Right 11/03/2018   Procedure: OPERATIVE ULTRASOUND;  Surgeon: Kaitlyn Luna, MD;  Location: [place];  Service: General;  Laterality: Right;  . PORT-A-CATH REMOVAL N/A 12/09/2018   Procedure: PORT REMOVAL;  Surgeon: Kaitlyn Luna, MD;  Location: [place];  Service: General;  Laterality: N/A;  . PORTACATH PLACEMENT Right 11/03/2018   Procedure: INSERTION PORT-A-CATH;  Surgeon: Kaitlyn Luna, MD;  Location: [place];  Service: General;  Laterality: Right;    There were no vitals filed for this visit.  Subjective Assessment -  05/09/19 1411    Subjective  I got my compression bra Friday and have been wearing it all the weekend. It fits good and I wore it all weekend. But I worked all weekend and by the time I got off yesterday the side of my breast was really full. I think it's just going to be worse on my work days from sitting all day.    Pertinent History  45m mass with adjacent 365mmass in the right breast with no axillary adenopathy. 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 : 2 lymph node removal    Patient Stated Goals  I want to be able to manage this pain/swelling on my own.    Currently in Pain?  No/denies                  Outpatient [facility]  Lymphedema Life Impact Scale Total Score  20.59 %           [facility] Adult PT Treatment/Exercise - 05/09/19 0001      Manual Therapy   Manual Lymphatic Drainage (MLD)  In Supine: Short neck, superficial and deep abdominals, Rt inguinal and Bil axillary nodes, Rt axillo-inguinal and anterior inter-axillary anastmosis, then Rt superior breast redirecting towards anterior inter-axillary anastomsis, Anteiror inter-axillary anastomosis 5x, inferior breast toward R axillo-inguinal anastomosis 5x, then into Lt S/L for Rt  lateral breast redirecting fluid to Rt axillo-inguinal and posterior inter-axillary anastomosis, then finished retracing initial anastomosis in supine and all previously worked nodes. Deep abdominals               PT Short Term Goals - 04/07/19 1146      PT SHORT TERM GOAL #1   Title  Pt will be independent with HEP and self MLD.    Baseline  Pt does not have HEP or know how to perform self MLD.    Time  3    Period  Weeks    Status  New    Target Date  04/28/19        PT Long Term Goals - 04/07/19 1146      PT LONG TERM GOAL #1   Title  Pt will report 1/10 pain or less with 50% improvement in symptoms in the R breast for  improve quality of life.    Baseline  4/10 pain intermittently.    Time  6    Period  Weeks    Status  New    Target Date  05/26/19      PT LONG TERM GOAL #2   Title  Patient to be properly fitted with compression garment to wear on daily basis.    Baseline  Pt does not have a compression garment.    Time  6    Period  Weeks    Status  New    Target Date  05/26/19      PT LONG TERM GOAL #3   Title  Patient will be independent in prevention/self-care management principles including self-massage and long term management plan for edema.    Baseline  pt is unaware of lymphedema management or risk reduction precautions    Time  6    Period  Weeks    Status  New    Target Date  05/26/19            Plan - 05/09/19 1504    Clinical Impression Statement  Continued with manual lymph drainage to Rt breast, focusing on lateral and inferior aspects when in Rt S/L. Pt did not want kinesiotape today reporting she wasn't goiung to work and feels that is when it helps her the most. Encouraged pt to cont wearing compression bra, which appears to be a good fit.    Personal Factors and Comorbidities  Age;Comorbidity 1    Comorbidities  Hx radiation    Stability/Clinical Decision Making  Stable/Uncomplicated    Rehab Potential  Excellent    PT Frequency  2x / week    PT Duration  6 weeks    PT Treatment/Interventions  ADLs/Self Care Home Management;Iontophoresis '4mg'$ /ml Dexamethasone;Moist Heat;Electrical Stimulation;Therapeutic exercise;Therapeutic activities;Functional mobility training;Neuromuscular re-education;Manual techniques;Manual lymph drainage;Compression bandaging    PT Next Visit Plan  progress postural exercises. Continue manual lymph drainage to Rt breast while reviewing with pt (and spouse if he needs to return for review). Reapply tape prn and instruct pt if able for home use    PT Home Exercise Plan  Access Code: [email or web address]; self manual lymph drainage    Consulted and Agree with  Plan of Care  Patient       Patient will benefit from skilled therapeutic intervention in order to improve the following deficits and impairments:  Pain, Decreased knowledge of precautions, Decreased knowledge of use of DME, Increased edema  Visit Diagnosis: Lymphedema, not elsewhere classified  Malignant neoplasm of lower-outer quadrant of right breast of  female, estrogen receptor negative [facility])     Problem List Patient Active Problem List   Diagnosis Date Noted  . Genetic testing 10/13/2018  . Family history of melanoma   . Family history of prostate cancer   . Family history of cervical cancer   . Malignant neoplasm of lower-outer quadrant of right breast of female, estrogen receptor negative ([place]) 10/06/2018  . Obesity, Class II, BMI 35-39.9 02/02/2018  . Elevated LDL cholesterol level 01/05/2017  . Dermatitis 01/31/2016  . Migraine without aura 04/21/2011  . Esophageal reflux 04/21/2011  . Depression with anxiety 04/21/2011  . Major depressive disorder, single episode 04/21/2011    Otelia Limes, PTA 05/09/2019, 5:34 PM  [place], [place], [address] Phone: [phone]   Fax:  [phone]  Name: [name] MRN: [id number] Date of Birth: [birth year]

## 2019-05-11 ENCOUNTER — Other Ambulatory Visit: Payer: Self-pay

## 2019-05-11 ENCOUNTER — Encounter: Payer: Self-pay | Admitting: Adult Health

## 2019-05-11 ENCOUNTER — Inpatient Hospital Stay: Payer: 59 | Attending: Genetic Counselor | Admitting: Adult Health

## 2019-05-11 VITALS — BP 122/96 | HR 106 | Temp 97.6°F | Resp 18 | Ht 63.0 in | Wt 226.7 lb

## 2019-05-11 DIAGNOSIS — Z793 Long term (current) use of hormonal contraceptives: Secondary | ICD-10-CM | POA: Insufficient documentation

## 2019-05-11 DIAGNOSIS — Z833 Family history of diabetes mellitus: Secondary | ICD-10-CM | POA: Insufficient documentation

## 2019-05-11 DIAGNOSIS — Z8249 Family history of ischemic heart disease and other diseases of the circulatory system: Secondary | ICD-10-CM | POA: Insufficient documentation

## 2019-05-11 DIAGNOSIS — Z923 Personal history of irradiation: Secondary | ICD-10-CM | POA: Diagnosis not present

## 2019-05-11 DIAGNOSIS — C50511 Malignant neoplasm of lower-outer quadrant of right female breast: Secondary | ICD-10-CM | POA: Diagnosis not present

## 2019-05-11 DIAGNOSIS — Z8049 Family history of malignant neoplasm of other genital organs: Secondary | ICD-10-CM | POA: Insufficient documentation

## 2019-05-11 DIAGNOSIS — Z79899 Other long term (current) drug therapy: Secondary | ICD-10-CM | POA: Insufficient documentation

## 2019-05-11 DIAGNOSIS — Z8042 Family history of malignant neoplasm of prostate: Secondary | ICD-10-CM | POA: Diagnosis not present

## 2019-05-11 DIAGNOSIS — Z791 Long term (current) use of non-steroidal anti-inflammatories (NSAID): Secondary | ICD-10-CM | POA: Diagnosis not present

## 2019-05-11 DIAGNOSIS — F329 Major depressive disorder, single episode, unspecified: Secondary | ICD-10-CM | POA: Insufficient documentation

## 2019-05-11 DIAGNOSIS — Z171 Estrogen receptor negative status [ER-]: Secondary | ICD-10-CM | POA: Insufficient documentation

## 2019-05-11 NOTE — Progress Notes (Signed)
SURVIVORSHIP VISIT:   BRIEF ONCOLOGIC HISTORY:  Oncology History  Malignant neoplasm of lower-outer quadrant of right breast of female, estrogen receptor negative ([place])  09/27/2018 Cancer Staging   Staging form: Breast, AJCC 8th Edition - Clinical stage from 09/27/2018: Stage IB (cT1b, cN0, cM0, G3, ER-, PR-, HER2-) - Signed by Gardenia Phlegm, NP on 10/06/2018   10/06/2018 Initial Diagnosis   Screening mammogram detected right breast mass. Diagnostic mammogram showed 48m mass with adjacent 351mmass in the right breast with no axillary adenopathy. 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  The report date is 10/13/2018.  The Common Hereditary Gene Panel offered by [facility] includes sequencing and/or deletion duplication testing of the following 48 genes: APC, ATM, AXIN2, BARD1, BMPR1A, BRCA1, BRCA2, BRIP1, CDH1, CDK4, CDKN2A (p14ARF), CDKN2A (p16INK4a), CHEK2, CTNNA1, DICER1, EPCAM (Deletion/duplication testing only), GREM1 (promoter region deletion/duplication testing only), KIT, MEN1, MLH1, MSH2, MSH3, MSH6, MUTYH, NBN, NF1, NHTL1, PALB2, PDGFRA, PMS2, POLD1, POLE, PTEN, RAD50, RAD51C, RAD51D, RNF43, SDHB, SDHC, SDHD, SMAD4, SMARCA4. STK11, TP53, TSC1, TSC2, and VHL.  The following genes were evaluated for sequence changes only: SDHA and HOXB13 c.251G>A variant only.    11/03/2018 Surgery   Right lumpectomy ([place]) ([id number] IDC, 0.5cm, grade 3, clear margins, 2 axillary lymph nodes negative.  Triple negative with a Ki-67 of 90%   11/10/2018 Cancer Staging   Staging form: Breast, AJCC 8th Edition - Pathologic: Stage IB (pT1a, pN0, cM0, G3, ER-, PR-, HER2-) - Signed by GuNicholas LoseMD on 11/10/2018   12/28/2018 - 02/10/2019 Radiation  Therapy   The patient initially received a dose of 50.4 Gy in 28 fractions to the breast using whole-breast tangent fields. This was delivered using a 3-D conformal technique. The patient then received a boost to the seroma. This delivered an additional 10 Gy in 5 fractions using a 3-field photon boost technique. The total dose was 60.4 Gy.     INTERVAL HISTORY:  [name] review her survivorship care plan detailing her treatment course for breast cancer, as well as monitoring long-term side effects of that treatment, education regarding health maintenance, screening, and overall wellness and health promotion.     Overall, Ms. McBardwelleports feeling quite well.  REVIEW OF SYSTEMS:  Review of Systems  Constitutional: Negative for appetite change, chills, fatigue, fever and unexpected weight change.  HENT:   Negative for hearing loss, lump/mass and trouble swallowing.   Eyes: Negative for eye problems and icterus.  Respiratory: Negative for chest tightness, cough and shortness of breath.   Cardiovascular: Negative for chest pain, leg swelling and palpitations.  Gastrointestinal: Negative for abdominal distention, abdominal pain, constipation, diarrhea, nausea and vomiting.  Endocrine: Negative for hot flashes.  Genitourinary: Negative for difficulty urinating.   Musculoskeletal: Negative for arthralgias.  Skin: Negative for itching and rash.  Neurological: Negative for dizziness, extremity weakness, headaches and numbness.  Hematological: Negative for adenopathy. Does not bruise/bleed easily.  Psychiatric/Behavioral: Negative for depression. The patient is not nervous/anxious.   Breast: Denies any new nodularity, masses, tenderness, nipple changes, or nipple discharge.      ONCOLOGY TREATMENT TEAM:  1. Surgeon:  [facility]rantley [facility] 2. Medical Oncologist: Dr. GuLindi Adie3. Radiation Oncologist: Dr. MoLisbeth Renshaw  PAST MEDICAL/SURGICAL HISTORY:  Past Medical History:   Diagnosis Date  . Allergy   . Depression   . Family history of cervical cancer   . Family history of melanoma   . Family history of prostate cancer   . GERD (gastroesophageal reflux disease)   . Migraine    Past Surgical History:  Procedure Laterality Date  . ADENOIDECTOMY    . APPENDECTOMY    . BREAST LUMPECTOMY WITH RADIOACTIVE SEED AND SENTINEL LYMPH NODE BIOPSY Right 11/03/2018   Procedure: RIGHT BREAST LUMPECTOMY WITH RADIOACTIVE SEED AND RIGHT SENTINEL LYMPH NODE MAPPING;  Surgeon: Erroll Luna, MD;  Location: [place];  Service: General;  Laterality: Right;  . CESAREAN SECTION    . OPERATIVE ULTRASOUND Right 11/03/2018   Procedure: OPERATIVE ULTRASOUND;  Surgeon: Erroll Luna, MD;  Location: [place];  Service: General;  Laterality: Right;  . PORT-A-CATH REMOVAL N/A 12/09/2018   Procedure: PORT REMOVAL;  Surgeon: Erroll Luna, MD;  Location: [place];  Service: General;  Laterality: N/A;  . PORTACATH PLACEMENT Right 11/03/2018   Procedure: INSERTION PORT-A-CATH;  Surgeon: Erroll Luna, MD;  Location: [place];  Service: General;  Laterality: Right;     ALLERGIES:  No Known Allergies   CURRENT MEDICATIONS:  Outpatient Encounter Medications as of 05/11/2019  Medication Sig  . Erenumab-aooe (AIMOVIG) 140 MG/ML SOAJ Inject 140 mg into the skin every 30 (thirty) days.  . Melatonin 10 MG TABS Take 1 tablet by mouth at bedtime as needed.  Marland Kitchen NUVARING 0.12-0.015 MG/24HR vaginal ring Place 1 each vaginally every 30 (thirty) days.  . pantoprazole (PROTONIX) 40 MG tablet Take 1 tablet (40 mg total) by mouth daily.  . rizatriptan (MAXALT) 10 MG tablet Take 10 mg by mouth as needed for migraine. May repeat in 2 hours if needed  . sertraline (ZOLOFT) 100 MG tablet Take 1.5 tablets (150 mg total) by mouth daily. PATIENT NEEDS OFFICE VISIT FOR ADDITIONAL REFILLS  . SUMAtriptan Succinate Refill 6 MG/0.5ML SOCT Inject  6 mg into the skin as needed.  . topiramate (TOPAMAX) 100 MG tablet Take 100 mg by mouth daily.  . traMADol (ULTRAM) 50 MG tablet tramadol 50 mg tablet  Take 1 tablet every 6 hours by oral route.  . [DISCONTINUED] promethazine (PHENERGAN) 12.5 MG tablet Take 12.5 mg by mouth every 6 (six) hours as needed for nausea.  Marland Kitchen ibuprofen (ADVIL) 800 MG tablet Take 1 tablet (800 mg total) by mouth every 8 (eight) hours as needed. (Patient not taking: Reported on 04/07/2019)  . promethazine (PHENERGAN) 25 MG tablet    No facility-administered encounter medications on file as of 05/11/2019.     ONCOLOGIC FAMILY HISTORY:  Family History  Problem Relation Age of Onset  . Hypertension Father   . Hypertension Mother   . Heart failure Maternal Grandmother   . Emphysema Maternal Grandmother   . Diabetes Maternal Grandfather   . Parkinson's disease Paternal Grandmother   . Heart disease Paternal Grandfather   . Prostate cancer Paternal Grandfather 14       not metastatic  . Melanoma Maternal Aunt 58       sun exposure  . Cervical cancer Maternal Great-grandmother   . Breast cancer Neg Hx      GENETIC COUNSELING/TESTING: See above  SOCIAL HISTORY:  Social History   Socioeconomic History  . Marital status: Married    Spouse name: Not on file  . Number of children: Not on file  . Years of education:  Not on file  . Highest education level: Not on file  Occupational History  . 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 Smoking status: Never Smoker  . Smokeless tobacco: Never Used  Substance and Sexual Activity  . Alcohol use: Yes    Comment: occasional  . Drug use: No  . Sexual activity: Yes  Other Topics Concern  . Not on file  Social History Narrative  . Not on file   Social Determinants of Health   Financial Resource Strain:   . Difficulty of Paying Living Expenses: Not on file  Food Insecurity:   . Worried About Charity fundraiser in the Last Year: Not  on file  . Ran Out of Food in the Last Year: Not on file  Transportation Needs: No Transportation Needs  . Lack of Transportation (Medical): No  . Lack of Transportation (Non-Medical): No  Physical Activity:   . Days of Exercise per Week: Not on file  . Minutes of Exercise per Session: Not on file  Stress:   . Feeling of Stress : Not on file  Social Connections:   . Frequency of Communication with Friends and Family: Not on file  . Frequency of Social Gatherings with Friends and Family: Not on file  . Attends Religious Services: Not on file  . Active Member of Clubs or Organizations: Not on file  . Attends Archivist Meetings: Not on file  . Marital Status: Not on file  Intimate Partner Violence:   . Fear of Current or Ex-Partner: Not on file  . Emotionally Abused: Not on file  . Physically Abused: Not on file  . Sexually Abused: Not on file     OBSERVATIONS/OBJECTIVE:  BP (!) 122/96 (BP Location: Left Arm, Patient Position: Sitting)   Pulse (!) 106   Temp 97.6 F (36.4 C) (Temporal)   Resp 18   Ht _0  (1.6 m)   Wt 226 lb 11.2 oz (102.8 kg)   SpO2 99%   BMI 40.16 kg/m  GENERAL: Patient is a well appearing female in no acute distress HEENT:  Sclerae anicteric.  Oropharynx clear and moist. No ulcerations or evidence of oropharyngeal candidiasis. Neck is supple.  NODES:  No cervical, supraclavicular, or axillary lymphadenopathy palpated.  BREAST EXAM:  Right breast s/p lumpectomy and radiation, no sign of local recurrence.  Left breast benign LUNGS:  Clear to auscultation bilaterally.  No wheezes or rhonchi. HEART:  Regular rate and rhythm. No murmur appreciated. ABDOMEN:  Soft, nontender.  Positive, normoactive bowel sounds. No organomegaly palpated. MSK:  No focal spinal tenderness to palpation. Full range of motion bilaterally in the upper extremities. EXTREMITIES:  No peripheral edema.   SKIN:  Clear with no obvious rashes or skin changes. No nail  dyscrasia. NEURO:  Nonfocal. Well oriented.  Appropriate affect.  LABORATORY DATA:  None for this visit.  DIAGNOSTIC IMAGING:  None for this visit.      ASSESSMENT AND PLAN:  Ms.. [name] is a pleasant 44 y.o. female with Stage IB right breast invasive ductal carcinoma, ER-/PR-/HER2-, diagnosed in 08/2018, treated with lumpectomy and adjuvant radiation therapy.  She presents to the Survivorship Clinic for our initial meeting and routine follow-up post-completion of treatment for breast cancer.    1. Stage IB right breast cancer:  Ms. Whitenight is continuing to recover from definitive treatment for breast cancer. She will follow-up with her medical oncologist, Dr. Lindi Adie in 4 months with history and physical  exam per surveillance protocol.   Her mammogram is due 08/2019; orders placed today. Today, a comprehensive survivorship care plan and treatment summary was reviewed with the patient today detailing her breast cancer diagnosis, treatment course, potential late/long-term effects of treatment, appropriate follow-up care with recommendations for the future, and patient education resources.  A copy of this summary, along with a letter will be sent to the patient's primary care provider via mail/fax/In Basket message after today's visit.    2. Bone health:  She was given education on specific activities to promote bone health.  3. Cancer screening:  Due to Ms. Macknight's history and her age, she should receive screening for skin cancers and gynecologic cancers.  The information and recommendations are listed on the patient's comprehensive care plan/treatment summary and were reviewed in detail with the patient.    4. Health maintenance and wellness promotion: Ms. Agan was encouraged to consume 5-7 servings of fruits and vegetables per day. We reviewed the "Nutrition Rainbow" handout, as well as the handout "Take Control of Your Health and Reduce Your Cancer Risk" from the [place].  She  was also encouraged to engage in moderate to vigorous exercise for 30 minutes per day most days of the week. We discussed the [employer] fitness program, which is designed for cancer survivors to help them become more physically fit after cancer treatments.  We reviewed safe sun practices.  She was instructed to limit her alcohol consumption and continue to abstain from tobacco use.     5. Support services/counseling: It is not uncommon for this period of the patient's cancer care trajectory to be one of many emotions and stressors.  We discussed how this can be increasingly difficult during the times of quarantine and social distancing due to the COVID-19 pandemic.   She was given information regarding our available services and encouraged to contact me with any questions or for help enrolling in any of our support group/programs.    Follow up instructions:    -Return to cancer center 4 months for f/u  -Mammogram due in 08/2019 -Follow up with Dr. Brantley Stage -She is welcome to return back to the Survivorship Clinic at any time; no additional follow-up needed at this time.  -Consider referral back to survivorship as a long-term survivor for continued surveillance  The patient was provided an opportunity to ask questions and all were answered. The patient agreed with the plan and demonstrated an understanding of the instructions.   Total encounter time: 30 minutes*  Wilber Bihari, NP 05/13/19 1:03 PM Medical Oncology and Hematology [facility] [place], [place] [address] Tel. [phone]    Fax. [phone]  *Total Encounter Time as defined by the Centers for Medicare and Medicaid Services includes, in addition to the face-to-face time of a patient visit (documented in the note above) non-face-to-face time: obtaining and reviewing outside history, ordering and reviewing medications, tests or procedures, care coordination (communications with other health care  professionals or caregivers) and documentation in the medical record.

## 2019-05-12 ENCOUNTER — Telehealth: Payer: Self-pay | Admitting: Hematology and Oncology

## 2019-05-12 NOTE — Telephone Encounter (Signed)
I talk with patient regarding schedule  

## 2019-05-13 ENCOUNTER — Ambulatory Visit: Payer: 59

## 2019-05-13 ENCOUNTER — Other Ambulatory Visit: Payer: Self-pay

## 2019-05-13 DIAGNOSIS — C50511 Malignant neoplasm of lower-outer quadrant of right female breast: Secondary | ICD-10-CM

## 2019-05-13 DIAGNOSIS — I890 Lymphedema, not elsewhere classified: Secondary | ICD-10-CM | POA: Diagnosis not present

## 2019-05-13 NOTE — Therapy (Signed)
[place], [place], [address] Phone: [phone]   Fax:  [phone]  Physical Therapy Treatment  Patient Details  Name: [name] MRN: [id number] Date of Birth: 21-May-1975 Referring Provider (PT): Nicholas Lose MD   Encounter Date: 05/13/2019  PT End of Session - 05/13/19 0901    Visit Number  10    Number of Visits  13    Date for PT Re-Evaluation  05/26/19    PT Start Time  0900    PT Stop Time  0958    PT Time Calculation (min)  58 min    Activity Tolerance  Patient tolerated treatment well    Behavior During Therapy  [facility] for tasks assessed/performed       Past Medical History:  Diagnosis Date  . Allergy   . Depression   . Family history of cervical cancer   . Family history of melanoma   . Family history of prostate cancer   . GERD (gastroesophageal reflux disease)   . Migraine     Past Surgical History:  Procedure Laterality Date  . ADENOIDECTOMY    . APPENDECTOMY    . BREAST LUMPECTOMY WITH RADIOACTIVE SEED AND SENTINEL LYMPH NODE BIOPSY Right 11/03/2018   Procedure: RIGHT BREAST LUMPECTOMY WITH RADIOACTIVE SEED AND RIGHT SENTINEL LYMPH NODE MAPPING;  Surgeon: Erroll Luna, MD;  Location: [place];  Service: General;  Laterality: Right;  . CESAREAN SECTION    . OPERATIVE ULTRASOUND Right 11/03/2018   Procedure: OPERATIVE ULTRASOUND;  Surgeon: Erroll Luna, MD;  Location: [place];  Service: General;  Laterality: Right;  . PORT-A-CATH REMOVAL N/A 12/09/2018   Procedure: PORT REMOVAL;  Surgeon: Erroll Luna, MD;  Location: [place];  Service: General;  Laterality: N/A;  . PORTACATH PLACEMENT Right 11/03/2018   Procedure: INSERTION PORT-A-CATH;  Surgeon: Erroll Luna, MD;  Location: [place];  Service: General;  Laterality: Right;    There were no vitals filed for this visit.  Subjective Assessment -  05/13/19 0901    Subjective  Pt reports that she is feeling a little better with less fluid in her breast. She has received her compression bra and has been able to wear it.    Pertinent History  11m mass with adjacent 381mmass in the right breast with no axillary adenopathy. 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 : 2 lymph node removal    Patient Stated Goals  I want to be able to manage this pain/swelling on my own.    Currently in Pain?  Yes    Pain Score  2     Pain Location  Breast                  [facility]patient [facility]  Lymphedema Life Impact Scale Total Score  20.59 %           [facility] Adult PT Treatment/Exercise - 05/13/19 0001      Exercises   Exercises  Other Exercises    Other Exercises   Strength ABC program including unilateral pec stretch 20 seconds RUE demonstration for correct positioning, posterior shoulder stretch RUE 20 seconds tactile cueing and demonstration for correct movement, Tricep stretch w/demonstration for correct muscle RUE overhead technique, BIl calf stretch at the wall pt was able to perform looking at picture, Quad stretch pt was able to perform 1x  20 seconds on each side using picture/instructions on sheet, butter fly stretch 20 seconds following directions/picutre on sheet, seated HS stretch 20 seconds Bil using instructions/pictures on sheet, supine lower back stretch 20 seconds using picture/directions on sheet, bird dog 10x (5x each side) from picture w/occasional VC to maintain abdominal activation to prevent arching of the back, supine glute bridge from picture/directions on sheet 10x, instead of sit up modified to supine heel taps with core activation 10x (5x each side) with VC for pelvic tilt and tapping.       Manual Therapy   Manual Therapy  Manual Lymphatic Drainage (MLD);Edema management    Manual Lymphatic Drainage (MLD)  In  Supine: Short neck, swimming in the terminus, 5 diaphragmatic breathes, Rt inguinal and Bil axillary nodes, Rt axillo-inguinal and anterior inter-axillary anastmosis, then Rt superior breast redirecting towards anterior inter-axillary anastomsis, Anteiror inter-axillary anastomosis 5x, inferior breast toward R axillo-inguinal anastomosis 5x, then into Lt S/L for Rt lateral breast redirecting fluid to Rt axillo-inguinal and posterior inter-axillary anastomosis, then finished retracing initial anastomosis in supine and all previously worked nodes. Deep abdominals             PT Education - 05/13/19 0959    Education Details  Pt educated with Strength ABC program to page 14, discussed wearing gray1/2 inch foam over the lateral aspect of her R breast with compression bra and possibly wearing her compression bra to bed to see if this doesn't help with fluid build up on the lateral aspect of her breast.    Person(s) Educated  Patient    Methods  Explanation;Demonstration;Verbal cues;Handout    Comprehension  Verbalized understanding;Returned demonstration       PT Short Term Goals - 04/07/19 1146      PT SHORT TERM GOAL #1   Title  Pt will be independent with HEP and self MLD.    Baseline  Pt does not have HEP or know how to perform self MLD.    Time  3    Period  Weeks    Status  New    Target Date  04/28/19        PT Long Term Goals - 04/07/19 1146      PT LONG TERM GOAL #1   Title  Pt will report 1/10 pain or less with 50% improvement in symptoms in the R breast for improve quality of life.    Baseline  4/10 pain intermittently.    Time  6    Period  Weeks    Status  New    Target Date  05/26/19      PT LONG TERM GOAL #2   Title  Patient to be properly fitted with compression garment to wear on daily basis.    Baseline  Pt does not have a compression garment.    Time  6    Period  Weeks    Status  New    Target Date  05/26/19      PT LONG TERM GOAL #3   Title  Patient  will be independent in prevention/self-care management principles including self-massage and long term management plan for edema.    Baseline  pt is unaware of lymphedema management or risk reduction precautions    Time  6    Period  Weeks    Status  New    Target Date  05/26/19            Plan - 05/13/19 0900    Clinical  Impression Statement  Pt presents today wearing her compression bra. Continued with MLD to the R breast down the axillary-inguinal, anterior/posterior inter-axillary pathways w/extra time spent at the lateral breast. Initiated strength after breast cancer program today. Pt was able to follow directions well using the sheet with only minimal cues for correct performance. Modification for abdominal exercises. Pt will benefit rom continued POC at this time.    Personal Factors and Comorbidities  Age;Comorbidity 1    Comorbidities  Hx radiation    Rehab Potential  Excellent    PT Frequency  2x / week    PT Duration  6 weeks    PT Treatment/Interventions  ADLs/Self Care Home Management;Iontophoresis 39m/ml Dexamethasone;Moist Heat;Electrical Stimulation;Therapeutic exercise;Therapeutic activities;Functional mobility training;Neuromuscular re-education;Manual techniques;Manual lymph drainage;Compression bandaging    PT Next Visit Plan  progress postural exercises/finish strength ABC from page 14. Continue manual lymph drainage to Rt breast while reviewing with pt (and spouse if he needs to return for review). Reapply tape prn and instruct pt if able for home use    PT Home Exercise Plan  Access Code: [email or web address]; self manual lymph drainage, strength ABC    Consulted and Agree with Plan of Care  Patient       Patient will benefit from skilled therapeutic intervention in order to improve the following deficits and impairments:  Pain, Decreased knowledge of precautions, Decreased knowledge of use of DME, Increased edema  Visit Diagnosis: Lymphedema, not elsewhere  classified  Malignant neoplasm of lower-outer quadrant of right breast of female, estrogen receptor negative ([facility]     Problem List Patient Active Problem List   Diagnosis Date Noted  . Genetic testing 10/13/2018  . Family history of melanoma   . Family history of prostate cancer   . Family history of cervical cancer   . Malignant neoplasm of lower-outer quadrant of right breast of female, estrogen receptor negative ([facility] 10/06/2018  . Obesity, Class II, BMI 35-39.9 02/02/2018  . Elevated LDL cholesterol level 01/05/2017  . Dermatitis 01/31/2016  . Migraine without aura 04/21/2011  . Esophageal reflux 04/21/2011  . Depression with anxiety 04/21/2011  . Major depressive disorder, single episode 04/21/2011    CAnder Purpura PT 05/13/2019, 10:02 AM  [place] [address]Phone: [phone]  Fax:  [phone] Name: DSAANYA ZIESKEMRN: [id number] of Birth: [birth year]

## 2019-05-23 ENCOUNTER — Other Ambulatory Visit: Payer: Self-pay

## 2019-05-23 ENCOUNTER — Ambulatory Visit: Payer: 59

## 2019-05-23 DIAGNOSIS — I890 Lymphedema, not elsewhere classified: Secondary | ICD-10-CM

## 2019-05-23 DIAGNOSIS — C50511 Malignant neoplasm of lower-outer quadrant of right female breast: Secondary | ICD-10-CM

## 2019-05-23 NOTE — Therapy (Signed)
[place], [place], [address] Phone: [phone]   Fax:  [phone]  Physical Therapy Treatment  Patient Details  Name: [name] MRN: [id number] Date of Birth: 18-Mar-1976 Referring Provider (PT): Nicholas Lose MD   Encounter Date: 05/23/2019  PT End of Session - 05/23/19 1146    Visit Number  11    Number of Visits  13    Date for PT Re-Evaluation  05/26/19    PT Start Time  0905    PT Stop Time  1002    PT Time Calculation (min)  57 min    Activity Tolerance  Patient tolerated treatment well    Behavior During Therapy  [facility] for tasks assessed/performed       Past Medical History:  Diagnosis Date  . Allergy   . Depression   . Family history of cervical cancer   . Family history of melanoma   . Family history of prostate cancer   . GERD (gastroesophageal reflux disease)   . Migraine     Past Surgical History:  Procedure Laterality Date  . ADENOIDECTOMY    . APPENDECTOMY    . BREAST LUMPECTOMY WITH RADIOACTIVE SEED AND SENTINEL LYMPH NODE BIOPSY Right 11/03/2018   Procedure: RIGHT BREAST LUMPECTOMY WITH RADIOACTIVE SEED AND RIGHT SENTINEL LYMPH NODE MAPPING;  Surgeon: Erroll Luna, MD;  Location: [place];  Service: General;  Laterality: Right;  . CESAREAN SECTION    . OPERATIVE ULTRASOUND Right 11/03/2018   Procedure: OPERATIVE ULTRASOUND;  Surgeon: Erroll Luna, MD;  Location: [place];  Service: General;  Laterality: Right;  . PORT-A-CATH REMOVAL N/A 12/09/2018   Procedure: PORT REMOVAL;  Surgeon: Erroll Luna, MD;  Location: [place];  Service: General;  Laterality: N/A;  . PORTACATH PLACEMENT Right 11/03/2018   Procedure: INSERTION PORT-A-CATH;  Surgeon: Erroll Luna, MD;  Location: [place];  Service: General;  Laterality: Right;    There were no vitals filed for this visit.  Subjective Assessment -  05/23/19 0909    Subjective  I can tell my Rt breast is getting better since I started therapy, but it does still fluctuate with my longer work days. It's really tender at the inferior aspect of my bresat today, can we focus on that? 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    Pertinent History  78m mass with adjacent 372mmass in the right breast with no axillary adenopathy. 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 : 2 lymph node removal    Patient Stated Goals  I want to be able to manage this pain/swelling on my own.    Currently in Pain?  No/denies                  [facility]  Lymphedema Life Impact Scale Total Score  20.59 %           [facility] Adult PT Treatment/Exercise - 05/23/19 0001      Manual Therapy   Manual Therapy  Manual Lymphatic Drainage (MLD)    Manual Lymphatic Drainage (MLD)  In Supine: Short neck, swimming in the terminus, superficial and deep abdominals, Rt inguinal and Lt axillary nodes, Rt axillo-inguinal and anterior inter-axillary anastmosis, then Rt superior breast redirecting towards anterior inter-axillary anastomsis, then into Lt S/L for Rt lateral breast, and focus to inferior where pt  c/o tenderness, redirecting fluid to Rt axillo-inguinal and posterior inter-axillary anastomosis, then finished retracing initial anastomosis in supine and all previously worked nodes.                PT Short Term Goals - 04/07/19 1146      PT SHORT TERM GOAL #1   Title  Pt will be independent with HEP and self MLD.    Baseline  Pt does not have HEP or know how to perform self MLD.    Time  3    Period  Weeks    Status  New    Target Date  04/28/19        PT Long Term Goals - 04/07/19 1146      PT LONG TERM GOAL #1   Title  Pt will report 1/10 pain or less with 50% improvement in symptoms in the R breast for improve  quality of life.    Baseline  4/10 pain intermittently.    Time  6    Period  Weeks    Status  New    Target Date  05/26/19      PT LONG TERM GOAL #2   Title  Patient to be properly fitted with compression garment to wear on daily basis.    Baseline  Pt does not have a compression garment.    Time  6    Period  Weeks    Status  New    Target Date  05/26/19      PT LONG TERM GOAL #3   Title  Patient will be independent in prevention/self-care management principles including self-massage and long term management plan for edema.    Baseline  pt is unaware of lymphedema management or risk reduction precautions    Time  6    Period  Weeks    Status  New    Target Date  05/26/19            Plan - 05/23/19 1146    Clinical Impression Statement  Pt requested focus on manual lymph drainage as her Rt inferior breast tenderness was increased today. She has begun trying new exercises at home from Strength ABC program. Good softening noted of firm tissue at lateral and inferior breast by end of session.    Personal Factors and Comorbidities  Age;Comorbidity 1    Comorbidities  Hx radiation    Stability/Clinical Decision Making  Stable/Uncomplicated    Rehab Potential  Excellent    PT Frequency  2x / week    PT Duration  6 weeks    PT Treatment/Interventions  ADLs/Self Care Home Management;Iontophoresis 22m/ml Dexamethasone;Moist Heat;Electrical Stimulation;Therapeutic exercise;Therapeutic activities;Functional mobility training;Neuromuscular re-education;Manual techniques;Manual lymph drainage;Compression bandaging    PT Next Visit Plan  Renew or D/C next session? Finish Strength ABC Program from page 14 and see if any questions from already instructed exs; cont manual lymph drainage to Rt breast. Cont with kinesiotape? Instruct pt in this if wanting to learn for home use.    PT Home Exercise Plan  Access Code: [email or web address] self manual lymph drainage, strength ABC    Consulted and Agree with  Plan of Care  Patient       Patient will benefit from skilled therapeutic intervention in order to improve the following deficits and impairments:  Pain, Decreased knowledge of precautions, Decreased knowledge of use of DME, Increased edema  Visit Diagnosis: Lymphedema, not elsewhere classified  Malignant neoplasm of lower-outer quadrant of right breast of female, estrogen  receptor negative [facility])     Problem List Patient Active Problem List   Diagnosis Date Noted  . Genetic testing 10/13/2018  . Family history of melanoma   . Family history of prostate cancer   . Family history of cervical cancer   . Malignant neoplasm of lower-outer quadrant of right breast of female, estrogen receptor negative ([place]) 10/06/2018  . Obesity, Class II, BMI 35-39.9 02/02/2018  . Elevated LDL cholesterol level 01/05/2017  . Dermatitis 01/31/2016  . Migraine without aura 04/21/2011  . Esophageal reflux 04/21/2011  . Depression with anxiety 04/21/2011  . Major depressive disorder, single episode 04/21/2011    Otelia Limes, PTA 05/23/2019, 11:50 AM  [place], [place], [address] Phone: [phone]   Fax:  [phone]  Name: [name] MRN: [id number] Date of Birth: 02-27-1976

## 2019-06-01 ENCOUNTER — Ambulatory Visit: Payer: 59 | Admitting: Rehabilitation

## 2019-06-02 ENCOUNTER — Other Ambulatory Visit: Payer: Self-pay

## 2019-06-02 ENCOUNTER — Ambulatory Visit: Payer: 59 | Attending: Hematology and Oncology

## 2019-06-02 DIAGNOSIS — R293 Abnormal posture: Secondary | ICD-10-CM

## 2019-06-02 DIAGNOSIS — I890 Lymphedema, not elsewhere classified: Secondary | ICD-10-CM

## 2019-06-02 DIAGNOSIS — Z171 Estrogen receptor negative status [ER-]: Secondary | ICD-10-CM | POA: Insufficient documentation

## 2019-06-02 DIAGNOSIS — C50511 Malignant neoplasm of lower-outer quadrant of right female breast: Secondary | ICD-10-CM | POA: Insufficient documentation

## 2019-06-02 NOTE — Therapy (Signed)
[place], [place], [address] Phone: [phone]   Fax:  [phone]  Physical Therapy Progress Note  Patient Details  Name: [name] MRN: [id number] Date of Birth: 1975/11/29 Referring Provider (PT): Nicholas Lose MD   Encounter Date: 06/02/2019  PT End of Session - 06/02/19 1026    Visit Number  12    Number of Visits  17    Date for PT Re-Evaluation  06/30/19    PT Start Time  0905    PT Stop Time  1003    PT Time Calculation (min)  58 min    Activity Tolerance  Patient tolerated treatment well    Behavior During Therapy  [facility] for tasks assessed/performed       Past Medical History:  Diagnosis Date  . Allergy   . Depression   . Family history of cervical cancer   . Family history of melanoma   . Family history of prostate cancer   . GERD (gastroesophageal reflux disease)   . Migraine     Past Surgical History:  Procedure Laterality Date  . ADENOIDECTOMY    . APPENDECTOMY    . BREAST LUMPECTOMY WITH RADIOACTIVE SEED AND SENTINEL LYMPH NODE BIOPSY Right 11/03/2018   Procedure: RIGHT BREAST LUMPECTOMY WITH RADIOACTIVE SEED AND RIGHT SENTINEL LYMPH NODE MAPPING;  Surgeon: Erroll Luna, MD;  Location: [place];  Service: General;  Laterality: Right;  . CESAREAN SECTION    . OPERATIVE ULTRASOUND Right 11/03/2018   Procedure: OPERATIVE ULTRASOUND;  Surgeon: Erroll Luna, MD;  Location: [place];  Service: General;  Laterality: Right;  . PORT-A-CATH REMOVAL N/A 12/09/2018   Procedure: PORT REMOVAL;  Surgeon: Erroll Luna, MD;  Location: [place];  Service: General;  Laterality: N/A;  . PORTACATH PLACEMENT Right 11/03/2018   Procedure: INSERTION PORT-A-CATH;  Surgeon: Erroll Luna, MD;  Location: [place];  Service: General;  Laterality: Right;    There were no vitals filed for this visit.  Subjective Assessment -  06/02/19 0908    Pertinent History  48m mass with adjacent 368mmass in the right breast with no axillary adenopathy. 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 : 2 lymph node removal    Patient Stated Goals  I want to be able to manage this pain/swelling on my own.    Currently in Pain?  No/denies                  [facility]patient [facility]  Lymphedema Life Impact Scale Total Score  20.59 %           [facility] Adult PT Treatment/Exercise - 06/02/19 0001      Manual Therapy   Manual Therapy  Manual Lymphatic Drainage (MLD)    Manual Lymphatic Drainage (MLD)  In Supine: Short neck, bil shoulder collectors, upper trapezius, superficial and deep abdominals, Rt inguinal and Lt axillary nodes, Rt axillo-inguinal and anterior inter-axillary anastmosis, then Rt superior breast redirecting towards anterior inter-axillary anastomsis, then into Lt S/L for Rt lateral breast, and focus to inferior where pt c/o tenderness, redirecting fluid to Rt axillo-inguinal and posterior inter-axillary anastomosis, then finished retracing initial anastomosis in supine and all previously worked nodes.                PT Short Term Goals - 06/02/19 1025      PT SHORT  TERM GOAL #1   Title  Pt will be independent with HEP and self MLD.    Baseline  Pt does not have HEP or know how to perform self MLD; pt is independent with self MLD and orking on being more consistent with HEP-06/02/19    Status  Partially Met    Target Date  06/30/19        PT Long Term Goals - 06/02/19 0910      PT LONG TERM GOAL #1   Title  Pt will report 1/10 pain or less with 50% improvement in symptoms in the R breast for improve quality of life.    Baseline  4/10 pain intermittently; pt reports near 50% improvement in symptoms but pain still averages about 4/10, but can still go as high 7/10 during long shift, however reports  pain is more intermittent now than it was-06/02/19    Status  Partially Met    Target Date  06/30/19      PT LONG TERM GOAL #2   Title  Patient to be properly fitted with compression garment to wear on daily basis.    Baseline  Pt does not have a compression garment; pt has and now wears her compression bra daily-06/02/19    Status  Achieved      PT LONG TERM GOAL #3   Title  Patient will be independent in prevention/self-care management principles including self-massage and long term management plan for edema.    Baseline  pt is unaware of lymphedema management or risk reduction precautions; pt is now independent with self MLD but is still struggling with long term management of symptoms-06/02/19    Status  Partially Met    Target Date  06/30/19      PT LONG TERM GOAL #4   Title  Pt will report independence with postural strengthening HEP.    Baseline  Pt reports intermittent at best currently-06/02/19    Time  4    Period  Weeks    Status  New    Target Date  06/30/19            Plan - 06/02/19 1028    Clinical Impression Statement  Continued with focus on manual therapy as pt reports her lateral and inferior Rt breast continues to feel full and at times like a "boulder". Palpable fullness is still present in lateral aspect of breast that does soften during session. Discussed progress towards goals today and pt has met goal for being independent with self MLD however does still struggle with long term management of symptoms. So discussed with her today a compression pump and issued handout for a Flexitouch so pt could look into it. Pt wants to consider this for now. Pt is agreeable to renewing therapy at this time for 1x/wk for 4 more weeks to help her finalize instruction of Strength ABC Program and long term management of lymphedema symptoms.    Personal Factors and Comorbidities  Age;Comorbidity 1    Comorbidities  Hx radiation    Stability/Clinical Decision Making   Stable/Uncomplicated    Rehab Potential  Excellent    PT Frequency  1x / week    PT Duration  6 weeks    PT Treatment/Interventions  ADLs/Self Care Home Management;Iontophoresis 60m/ml Dexamethasone;Moist Heat;Electrical Stimulation;Therapeutic exercise;Therapeutic activities;Functional mobility training;Neuromuscular re-education;Manual techniques;Manual lymph drainage;Compression bandaging    PT Next Visit Plan  Renewal done this visit. FDeRidderProgram from page 14 and see if any questions from already  instructed exercises; cont manual lymph drainage to Rt breast. If pt interested, send demographics to Flexitouch.    PT Home Exercise Plan  Access Code: [email or web address]; self manual lymph drainage, strength ABC    Consulted and Agree with Plan of Care  Patient       Patient will benefit from skilled therapeutic intervention in order to improve the following deficits and impairments:  Pain, Decreased knowledge of precautions, Decreased knowledge of use of DME, Increased edema  Visit Diagnosis: Lymphedema, not elsewhere classified  Malignant neoplasm of lower-outer quadrant of right breast of female, estrogen receptor negative ([place])  Abnormal posture     Problem List Patient Active Problem List   Diagnosis Date Noted  . Genetic testing 10/13/2018  . Family history of melanoma   . Family history of prostate cancer   . Family history of cervical cancer   . Malignant neoplasm of lower-outer quadrant of right breast of female, estrogen receptor negative ([place]) 10/06/2018  . Obesity, Class II, BMI 35-39.9 02/02/2018  . Elevated LDL cholesterol level 01/05/2017  . Dermatitis 01/31/2016  . Migraine without aura 04/21/2011  . Esophageal reflux 04/21/2011  . Depression with anxiety 04/21/2011  . Major depressive disorder, single episode 04/21/2011   Tomma Rakers, PT 06/02/19 10:59 AM   Ander Purpura, PTA 06/02/2019, 10:59 AM  [place], [place], [address] Phone: [phone]   Fax:  [phone]  Name: [name] MRN: [id number] Date of Birth: 08/26/1975

## 2019-06-06 ENCOUNTER — Other Ambulatory Visit: Payer: Self-pay

## 2019-06-06 ENCOUNTER — Ambulatory Visit: Payer: 59

## 2019-06-06 DIAGNOSIS — C50511 Malignant neoplasm of lower-outer quadrant of right female breast: Secondary | ICD-10-CM

## 2019-06-06 DIAGNOSIS — I890 Lymphedema, not elsewhere classified: Secondary | ICD-10-CM | POA: Diagnosis not present

## 2019-06-06 DIAGNOSIS — Z171 Estrogen receptor negative status [ER-]: Secondary | ICD-10-CM

## 2019-06-06 DIAGNOSIS — R293 Abnormal posture: Secondary | ICD-10-CM

## 2019-06-06 NOTE — Therapy (Signed)
[place], [place], [address] Phone: [phone]   Fax:  [phone]  Physical Therapy Treatment  Patient Details  Name: [name] MRN: [id number] Date of Birth: 09/18/1975 Referring Provider (PT): Kaitlyn Lose MD   Encounter Date: 06/06/2019  PT End of Session - 06/06/19 1005    Visit Number  13    Number of Visits  17    Date for PT Re-Evaluation  06/30/19    PT Start Time  0906   pt arrived late   PT Stop Time  1004    PT Time Calculation (min)  58 min    Activity Tolerance  Patient tolerated treatment well    Behavior During Therapy  [name] for tasks assessed/performed       Past Medical History:  Diagnosis Date  . Allergy   . Depression   . Family history of cervical cancer   . Family history of melanoma   . Family history of prostate cancer   . GERD (gastroesophageal reflux disease)   . Migraine     Past Surgical History:  Procedure Laterality Date  . ADENOIDECTOMY    . APPENDECTOMY    . BREAST LUMPECTOMY WITH RADIOACTIVE SEED AND SENTINEL LYMPH NODE BIOPSY Right 11/03/2018   Procedure: RIGHT BREAST LUMPECTOMY WITH RADIOACTIVE SEED AND RIGHT SENTINEL LYMPH NODE MAPPING;  Surgeon: Kaitlyn Luna, MD;  Location: [facility];  Service: General;  Laterality: Right;  . CESAREAN SECTION    . OPERATIVE ULTRASOUND Right 11/03/2018   Procedure: OPERATIVE ULTRASOUND;  Surgeon: Kaitlyn Luna, MD;  Location: [facility];  Service: General;  Laterality: Right;  . PORT-A-CATH REMOVAL N/A 12/09/2018   Procedure: PORT REMOVAL;  Surgeon: Kaitlyn Luna, MD;  Location: [facility];  Service: General;  Laterality: N/A;  . PORTACATH PLACEMENT Right 11/03/2018   Procedure: INSERTION PORT-A-CATH;  Surgeon: Kaitlyn Luna, MD;  Location: [facility];  Service: General;  Laterality: Right;    There were no vitals filed for this visit.  Subjective  Assessment - 06/06/19 0917    Subjective  I worked 3 straight days this weekend so my Rt breast was really, really sore and felt full, like I have a rock in my breast. My Rt axilla also feels tight at those times. I also notice some discoloration when my breast is fuller. I did the self MLD Saturday night and it definitely relieved some of the pressure.    Pertinent History  87m mass with adjacent 392mmass in the right breast with no axillary adenopathy. 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 : 2 lymph node removal    Patient Stated Goals  I want to be able to manage this pain/swelling on my own.    Currently in Pain?  No/denies                  [facility]patient [facility]  Lymphedema Life Impact Scale Total Score  20.59 %           [facility] Adult PT Treatment/Exercise - 06/06/19 0001      Exercises   Other Exercises   Completed Strength ABC Packet working from chest press thru to bicep curls having pt return demo for all, 3-4 reps for each.      Manual Therapy   Manual Therapy  Manual Lymphatic Drainage (MLD);Myofascial release;Passive ROM    Myofascial  Release  To Rt axilla during P/ROM    Manual Lymphatic Drainage (MLD)  In Supine: Short neck, bil shoulder collectors, upper trapezius, superficial and deep abdominals, Rt inguinal and Lt axillary nodes, Rt axillo-inguinal and anterior inter-axillary anastmosis, then Rt superior breast redirecting towards anterior inter-axillary anastomsis, then into Lt S/L for Rt lateral breast, and focus to inferior where pt c/o tenderness, redirecting fluid to Rt axillo-inguinal and posterior inter-axillary anastomosis, then finished retracing initial anastomosis in supine and all previously worked nodes.     Passive ROM  To Rt shoulder in supine to end range flexion and abduction.               PT Short Term Goals - 06/02/19 1025       PT SHORT TERM GOAL #1   Title  Pt will be independent with HEP and self MLD.    Baseline  Pt does not have HEP or know how to perform self MLD; pt is independent with self MLD and orking on being more consistent with HEP-06/02/19    Status  Partially Met    Target Date  06/30/19        PT Long Term Goals - 06/02/19 0910      PT LONG TERM GOAL #1   Title  Pt will report 1/10 pain or less with 50% improvement in symptoms in the R breast for improve quality of life.    Baseline  4/10 pain intermittently; pt reports near 50% improvement in symptoms but pain still averages about 4/10, but can still go as high 7/10 during long shift, however reports pain is more intermittent now than it was-06/02/19    Status  Partially Met    Target Date  06/30/19      PT LONG TERM GOAL #2   Title  Patient to be properly fitted with compression garment to wear on daily basis.    Baseline  Pt does not have a compression garment; pt has and now wears her compression bra daily-06/02/19    Status  Achieved      PT LONG TERM GOAL #3   Title  Patient will be independent in prevention/self-care management principles including self-massage and long term management plan for edema.    Baseline  pt is unaware of lymphedema management or risk reduction precautions; pt is now independent with self MLD but is still struggling with long term management of symptoms-06/02/19    Status  Partially Met    Target Date  06/30/19      PT LONG TERM GOAL #4   Title  Pt will report independence with postural strengthening HEP.    Baseline  Pt reports intermittent at best currently-06/02/19    Time  4    Period  Weeks    Status  New    Target Date  06/30/19            Plan - 06/06/19 1005    Clinical Impression Statement  Completed instruction of Strength ABC Program today. [name] was able to return good demo of all remaining exercises. Continued with manual therapy including end P/ROM of Rt shoulder and myofascial release of  Rt axilla per pt reports of tighness and she did have limitations here todya. Encouraged pt to stretch into end ROM in doorway multiple times a day, like after each times she uses the bathroom, to work on decreasing end range tightness. She verbalized understanding. Discussed compression pump again and pt reports not being interested at this  time due to she'll have trouble sitting for a whole hour for the treatment as her 35 yr old grandson lives with them and she works 12 hr shifts so struggles to find the time.    Personal Factors and Comorbidities  Age;Comorbidity 1    Comorbidities  Hx radiation    Stability/Clinical Decision Making  Stable/Uncomplicated    Rehab Potential  Excellent    PT Frequency  1x / week    PT Duration  6 weeks    PT Treatment/Interventions  ADLs/Self Care Home Management;Iontophoresis 51m/ml Dexamethasone;Moist Heat;Electrical Stimulation;Therapeutic exercise;Therapeutic activities;Functional mobility training;Neuromuscular re-education;Manual techniques;Manual lymph drainage;Compression bandaging    PT Next Visit Plan  Cont manual lymph drainage to Rt breast. If pt interested, send demographics to [employer].    PT Home Exercise Plan  Access Code: [email or web address] self manual lymph drainage, strength ABC    Consulted and Agree with Plan of Care  Patient       Patient will benefit from skilled therapeutic intervention in order to improve the following deficits and impairments:  Pain, Decreased knowledge of precautions, Decreased knowledge of use of DME, Increased edema  Visit Diagnosis: Lymphedema, not elsewhere classified  Malignant neoplasm of lower-outer quadrant of right breast of female, estrogen receptor negative ([address]  Abnormal posture     Problem List Patient Active Problem List   Diagnosis Date Noted  . Genetic testing 10/13/2018  . Family history of melanoma   . Family history of prostate cancer   . Family history of cervical cancer   . Malignant  neoplasm of lower-outer quadrant of right breast of female, estrogen receptor negative ([place] 10/06/2018  . Obesity, Class II, BMI 35-39.9 02/02/2018  . Elevated LDL cholesterol level 01/05/2017  . Dermatitis 01/31/2016  . Migraine without aura 04/21/2011  . Esophageal reflux 04/21/2011  . Depression with anxiety 04/21/2011  . Major depressive disorder, single episode 04/21/2011    ROtelia Limes PTA 06/06/2019, 10:12 AM  [place] [address]Phone: [phone]  Fax:  [phone] Name: Kaitlyn CONSTABLEMRN: [id number] of Birth: [birth year]

## 2019-06-10 ENCOUNTER — Ambulatory Visit: Payer: 59

## 2019-06-16 ENCOUNTER — Ambulatory Visit: Payer: 59

## 2019-06-16 ENCOUNTER — Other Ambulatory Visit: Payer: Self-pay

## 2019-06-16 DIAGNOSIS — I890 Lymphedema, not elsewhere classified: Secondary | ICD-10-CM

## 2019-06-16 DIAGNOSIS — C50511 Malignant neoplasm of lower-outer quadrant of right female breast: Secondary | ICD-10-CM

## 2019-06-16 DIAGNOSIS — R293 Abnormal posture: Secondary | ICD-10-CM

## 2019-06-16 NOTE — Therapy (Signed)
[place], [place], [address] Phone: [phone]   Fax:  [phone]  Physical Therapy Treatment  Patient Details  Name: [name] MRN: [id number] Date of Birth: 07/29/1975 Referring Provider (PT): Nicholas Lose MD   Encounter Date: 06/16/2019  PT End of Session - 06/16/19 1114    Visit Number  14    Number of Visits  17    Date for PT Re-Evaluation  06/30/19    PT Start Time  1008    PT Stop Time  1106    PT Time Calculation (min)  58 min    Activity Tolerance  Patient tolerated treatment well    Behavior During Therapy  [facility] for tasks assessed/performed       Past Medical History:  Diagnosis Date  . Allergy   . Depression   . Family history of cervical cancer   . Family history of melanoma   . Family history of prostate cancer   . GERD (gastroesophageal reflux disease)   . Migraine     Past Surgical History:  Procedure Laterality Date  . ADENOIDECTOMY    . APPENDECTOMY    . BREAST LUMPECTOMY WITH RADIOACTIVE SEED AND SENTINEL LYMPH NODE BIOPSY Right 11/03/2018   Procedure: RIGHT BREAST LUMPECTOMY WITH RADIOACTIVE SEED AND RIGHT SENTINEL LYMPH NODE MAPPING;  Surgeon: Erroll Luna, MD;  Location: [facility];  Service: General;  Laterality: Right;  . CESAREAN SECTION    . OPERATIVE ULTRASOUND Right 11/03/2018   Procedure: OPERATIVE ULTRASOUND;  Surgeon: Erroll Luna, MD;  Location: [facility];  Service: General;  Laterality: Right;  . PORT-A-CATH REMOVAL N/A 12/09/2018   Procedure: PORT REMOVAL;  Surgeon: Erroll Luna, MD;  Location: [facility];  Service: General;  Laterality: N/A;  . PORTACATH PLACEMENT Right 11/03/2018   Procedure: INSERTION PORT-A-CATH;  Surgeon: Erroll Luna, MD;  Location: [place];  Service: General;  Laterality: Right;    There were no vitals filed for this visit.  Subjective Assessment -  06/16/19 1012    Subjective  I had my first COVID vaccine 7 days ago. I seemed to do fine with it other than my Lt shoulder was sore for 2 days and I don't know if it was in my head but my breast seemed a little more swollen since then.    Pertinent History  55m mass with adjacent 370mmass in the right breast with no axillary adenopathy. 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 : 2 lymph node removal    Patient Stated Goals  I want to be able to manage this pain/swelling on my own.    Currently in Pain?  No/denies                  Outpatient Re[facility]  Lymphedema Life Impact Scale Total Score  20.59 %           [facility] Adult PT Treatment/Exercise - 06/16/19 0001      Manual Therapy   Myofascial Release  To Rt axilla during P/ROM    Manual Lymphatic Drainage (MLD)  In Supine: Short neck, bil shoulder collectors, upper trapezius, superficial and deep abdominals, Rt inguinal and Lt axillary nodes, Rt axillo-inguinal and anterior inter-axillary anastmosis, then Rt superior breast redirecting towards anterior inter-axillary anastomsis, then into Lt S/L for Rt lateral breast, and focus to inferior where pt c/o  tenderness, redirecting fluid to Rt axillo-inguinal and posterior inter-axillary anastomosis, then finished retracing initial anastomosis in supine and all previously worked nodes.     Passive ROM  To Rt shoulder in supine to end range flexion and abduction.               PT Short Term Goals - 06/02/19 1025      PT SHORT TERM GOAL #1   Title  Pt will be independent with HEP and self MLD.    Baseline  Pt does not have HEP or know how to perform self MLD; pt is independent with self MLD and orking on being more consistent with HEP-06/02/19    Status  Partially Met    Target Date  06/30/19        PT Long Term Goals - 06/02/19 0910      PT LONG TERM GOAL #1    Title  Pt will report 1/10 pain or less with 50% improvement in symptoms in the R breast for improve quality of life.    Baseline  4/10 pain intermittently; pt reports near 50% improvement in symptoms but pain still averages about 4/10, but can still go as high 7/10 during long shift, however reports pain is more intermittent now than it was-06/02/19    Status  Partially Met    Target Date  06/30/19      PT LONG TERM GOAL #2   Title  Patient to be properly fitted with compression garment to wear on daily basis.    Baseline  Pt does not have a compression garment; pt has and now wears her compression bra daily-06/02/19    Status  Achieved      PT LONG TERM GOAL #3   Title  Patient will be independent in prevention/self-care management principles including self-massage and long term management plan for edema.    Baseline  pt is unaware of lymphedema management or risk reduction precautions; pt is now independent with self MLD but is still struggling with long term management of symptoms-06/02/19    Status  Partially Met    Target Date  06/30/19      PT LONG TERM GOAL #4   Title  Pt will report independence with postural strengthening HEP.    Baseline  Pt reports intermittent at best currently-06/02/19    Time  4    Period  Weeks    Status  New    Target Date  06/30/19            Plan - 06/16/19 1115    Clinical Impression Statement  Continued with focus on manual lymph drainage and promoting decreased fascial tightness in Rt axilla. Pt reports feeling like she had some increased fibrotic fluid since having her first COVID vaccine last week. She isn't sure if it's connected, just something she noticed. There did not appear to be more fluid than normal at Rt lateral breast, but still some palpable that continues to soften with MLD. Pt has 1 more visit next week and is agreeable to D/C then.    Personal Factors and Comorbidities  Age;Comorbidity 1    Comorbidities  Hx radiation     Stability/Clinical Decision Making  Stable/Uncomplicated    Rehab Potential  Excellent    PT Frequency  1x / week    PT Duration  6 weeks    PT Treatment/Interventions  ADLs/Self Care Home Management;Iontophoresis 15m/ml Dexamethasone;Moist Heat;Electrical Stimulation;Therapeutic exercise;Therapeutic activities;Functional mobility training;Neuromuscular re-education;Manual techniques;Manual lymph drainage;Compression bandaging  PT Next Visit Plan  D/C next session. Reassess goals and finalize HEP prn. Cont Rt breast manual lymph drainage. Pt not interested in pursuing a compression pump at this time.    PT Home Exercise Plan  Access Code: [email or web address]; self manual lymph drainage, strength ABC    Consulted and Agree with Plan of Care  Patient       Patient will benefit from skilled therapeutic intervention in order to improve the following deficits and impairments:  Pain, Decreased knowledge of precautions, Decreased knowledge of use of DME, Increased edema  Visit Diagnosis: Lymphedema, not elsewhere classified  Malignant neoplasm of lower-outer quadrant of right breast of female, estrogen receptor negative ([place])  Abnormal posture     Problem List Patient Active Problem List   Diagnosis Date Noted  . Genetic testing 10/13/2018  . Family history of melanoma   . Family history of prostate cancer   . Family history of cervical cancer   . Malignant neoplasm of lower-outer quadrant of right breast of female, estrogen receptor negative ([place]) 10/06/2018  . Obesity, Class II, BMI 35-39.9 02/02/2018  . Elevated LDL cholesterol level 01/05/2017  . Dermatitis 01/31/2016  . Migraine without aura 04/21/2011  . Esophageal reflux 04/21/2011  . Depression with anxiety 04/21/2011  . Major depressive disorder, single episode 04/21/2011    Otelia Limes, PTA 06/16/2019, [place], [place],  [address] Phone: [phone]   Fax:  [phone]  Name: [name] MRN: [id number] Date of Birth: [birth year]

## 2019-06-20 ENCOUNTER — Other Ambulatory Visit: Payer: Self-pay

## 2019-06-20 ENCOUNTER — Ambulatory Visit: Payer: 59

## 2019-06-20 DIAGNOSIS — I890 Lymphedema, not elsewhere classified: Secondary | ICD-10-CM | POA: Diagnosis not present

## 2019-06-20 DIAGNOSIS — C50511 Malignant neoplasm of lower-outer quadrant of right female breast: Secondary | ICD-10-CM

## 2019-06-20 DIAGNOSIS — R293 Abnormal posture: Secondary | ICD-10-CM

## 2019-06-20 NOTE — Therapy (Addendum)
[facility] [address] [place], [place], [address] Phone: [phone]   Fax:  [phone]  Physical Therapy Discharge Note  Patient Details  Name: [name] MRN: [id number] Date of Birth: 05/18/1975 Referring Provider (PT): Vinay Gudena MD   Encounter Date: 06/20/2019  PT End of Session - 06/20/19 1102    Visit Number  15    Number of Visits  17    Date for PT Re-Evaluation  06/30/19    PT Start Time  1006    PT Stop Time  1102    PT Time Calculation (min)  56 min    Activity Tolerance  Patient tolerated treatment well    Behavior During Therapy  WFL for tasks assessed/performed       Past Medical History:  Diagnosis Date  . Allergy   . Depression   . Family history of cervical cancer   . Family history of melanoma   . Family history of prostate cancer   . GERD (gastroesophageal reflux disease)   . Migraine     Past Surgical History:  Procedure Laterality Date  . ADENOIDECTOMY    . APPENDECTOMY    . BREAST LUMPECTOMY WITH RADIOACTIVE SEED AND SENTINEL LYMPH NODE BIOPSY Right 11/03/2018   Procedure: RIGHT BREAST LUMPECTOMY WITH RADIOACTIVE SEED AND RIGHT SENTINEL LYMPH NODE MAPPING;  Surgeon: Cornett, Thomas, MD;  Location: [facility];  Service: General;  Laterality: Right;  . CESAREAN SECTION    . OPERATIVE ULTRASOUND Right 11/03/2018   Procedure: OPERATIVE ULTRASOUND;  Surgeon: Cornett, Thomas, MD;  Location: [facility];  Service: General;  Laterality: Right;  . PORT-A-CATH REMOVAL N/A 12/09/2018   Procedure: PORT REMOVAL;  Surgeon: Cornett, Thomas, MD;  Location: [facility];  Service: General;  Laterality: N/A;  . PORTACATH PLACEMENT Right 11/03/2018   Procedure: INSERTION PORT-A-CATH;  Surgeon: Cornett, Thomas, MD;  Location: [facility];  Service: General;  Laterality: Right;    There were no vitals filed for this visit.  Subjective Assessment -  06/20/19 1014    Subjective  Feeling a little full in my Rt breast coming off work all weekend. It feels better today as I did the self manual lymph drainage Saturday night.    Pertinent History  8mm mass with adjacent 3mm mass in the right breast with no axillary adenopathy. 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 : 2 lymph node removal    Patient Stated Goals  I want to be able to manage this pain/swelling on my own.    Currently in Pain?  No/denies                  [facility]  Lymphedema Life Impact Scale Total Score  20.59 %           [facility] Adult PT Treatment/Exercise - 06/20/19 0001      Self-Care   Self-Care  Other Self-Care Comments    Other Self-Care Comments   Showed pt different swell spot options she could consider to wear in her bra either all day or just when home/at night. Pt liked the Solaris breast swell spot so issued printout from [email or web address] website with info. Also showed her sample in clinic.      Manual Therapy   Manual Lymphatic Drainage (MLD)  In Supine: Short neck, bil shoulder collectors, upper trapezius, superficial and deep abdominals, Rt   inguinal and Lt axillary nodes, Rt axillo-inguinal and anterior inter-axillary anastmosis, then Rt superior breast redirecting towards anterior inter-axillary anastomsis, then into Lt S/L for Rt lateral breast, and focus to inferior where pt c/o tenderness, redirecting fluid to Rt axillo-inguinal and posterior inter-axillary anastomosis, then finished retracing initial anastomosis in supine and all previously worked nodes.              PT Education - 06/20/19 1243    Education Details  Educated pt regarding varying options of swell spots for breast.    Person(s) Educated  Patient    Methods  Explanation;Demonstration;Handout   showed her sample in clinic and how to wear    Comprehension  Verbalized understanding       PT Short Term Goals - 06/02/19 1025      PT SHORT TERM GOAL #1   Title  Pt will be independent with HEP and self MLD.    Baseline  Pt does not have HEP or know how to perform self MLD; pt is independent with self MLD and orking on being more consistent with HEP-06/02/19    Status  Partially Met    Target Date  06/30/19        PT Long Term Goals - 06/20/19 1016      PT LONG TERM GOAL #1   Title  Pt will report 1/10 pain or less with 50% improvement in symptoms in the R breast for improve quality of life.    Baseline  4/10 pain intermittently; pt reports near 50% improvement in symptoms but pain still averages about 4/10, but can still go as high 7/10 during long shift, however reports pain is more intermittent now than it was-06/02/19; had 4/10 "pressure, intermittent" pain when fuller at end of 2 long work days, has improved from being 7-8/10 and reports 70-75% improvement at this time as well-06/20/19    Status  Partially Met      PT LONG TERM GOAL #2   Title  Patient to be properly fitted with compression garment to wear on daily basis.    Baseline  Pt does not have a compression garment; pt has and now wears her compression bra daily-06/02/19; wears compression bras daily and is looking at ordering a breast Solaris swell spot as well-06/20/19    Status  Achieved      PT LONG TERM GOAL #3   Title  Patient will be independent in prevention/self-care management principles including self-massage and long term management plan for edema.    Baseline  pt is unaware of lymphedema management or risk reduction precautions; pt is now independent with self MLD but is still struggling with long term management of symptoms-06/02/19; though pt still has flare ups of lymphedema after long work days she repots feeling better equipped to manage symptoms at this time with wear of compression and performing self MLD-06/20/19    Status  Achieved      PT LONG TERM GOAL  #4   Title  Pt will report independence with postural strengthening HEP.    Baseline  Pt reports intermittent at best currently-06/02/19; pt reports independence with HEP at this time-06/20/19    Status  Achieved            Plan - 06/20/19 1239    Clinical Impression Statement  Pt has met all goals and is ready for D/C at this time. Also educated her about Jovi Pak and Solaris swell spots, showing her sample and how to order online from   [email or web address]. Pt liked the Solaris breast swell spot issued her printout to know how to order from home.    Personal Factors and Comorbidities  Age;Comorbidity 1    Comorbidities  Hx radiation    Stability/Clinical Decision Making  Stable/Uncomplicated    Rehab Potential  Excellent    PT Frequency  1x / week    PT Duration  6 weeks    PT Treatment/Interventions  ADLs/Self Care Home Management;Iontophoresis 4mg/ml Dexamethasone;Moist Heat;Electrical Stimulation;Therapeutic exercise;Therapeutic activities;Functional mobility training;Neuromuscular re-education;Manual techniques;Manual lymph drainage;Compression bandaging    PT Next Visit Plan  D/C this session.    PT Home Exercise Plan  Access Code: [email or web address]; self manual lymph drainage, strength ABC    Recommended Other Services  Issued info on how to order Solaris breast swell spot    Consulted and Agree with Plan of Care  Patient       Patient will benefit from skilled therapeutic intervention in order to improve the following deficits and impairments:  Pain, Decreased knowledge of precautions, Decreased knowledge of use of DME, Increased edema  Visit Diagnosis: Lymphedema, not elsewhere classified  Malignant neoplasm of lower-outer quadrant of right breast of female, estrogen receptor negative (HCC)  Abnormal posture     Problem List Patient Active Problem List   Diagnosis Date Noted  . Genetic testing 10/13/2018  . Family history of melanoma   . Family history of prostate cancer    . Family history of cervical cancer   . Malignant neoplasm of lower-outer quadrant of right breast of female, estrogen receptor negative (HCC) 10/06/2018  . Obesity, Class II, BMI 35-39.9 02/02/2018  . Elevated LDL cholesterol level 01/05/2017  . Dermatitis 01/31/2016  . Migraine without aura 04/21/2011  . Esophageal reflux 04/21/2011  . Depression with anxiety 04/21/2011  . Major depressive disorder, single episode 04/21/2011    PHYSICAL THERAPY DISCHARGE SUMMARY Plan: Patient agrees to discharge.  Patient goals were partially met. Patient is being discharged due to being pleased with the current functional level.  ?????       Rosenberger, Valerie Ann, PTA 06/20/2019, 12:44 PM  Catherine Healy, PT 06/20/19 5:11 PM   [facility] [address] [place], [place], [address] Phone: [phone]   Fax:  [phone]  Name: Collette W Bellissimo MRN: [id number] Date of Birth: 10/07/1975   

## 2019-09-07 ENCOUNTER — Other Ambulatory Visit: Payer: Self-pay

## 2019-09-07 ENCOUNTER — Ambulatory Visit
Admission: RE | Admit: 2019-09-07 | Discharge: 2019-09-07 | Disposition: A | Discharge status: Home / Self Care | Payer: 59 | Source: Ambulatory Visit | Attending: Adult Health | Admitting: Adult Health

## 2019-09-07 DIAGNOSIS — Z171 Estrogen receptor negative status [ER-]: Secondary | ICD-10-CM

## 2019-09-07 HISTORY — DX: Personal history of irradiation: Z92.3

## 2019-09-07 NOTE — Progress Notes (Signed)
Patient Care Team: Annye English as PCP - General (Physician Assistant) Nicholas Lose, MD as Consulting Physician (Hematology and Oncology) Kyung Rudd, MD as Consulting Physician (Radiation Oncology) Erroll Luna, MD as Consulting Physician (General Surgery)  DIAGNOSIS:    ICD-10-CM   1. Malignant neoplasm of lower-outer quadrant of right breast of female, estrogen receptor negative ([place])  C50.511    Z17.1     SUMMARY OF ONCOLOGIC HISTORY: Oncology History  Malignant neoplasm of lower-outer quadrant of right breast of female, estrogen receptor negative ([place])  09/27/2018 Cancer Staging   Staging form: Breast, AJCC 8th Edition - Clinical stage from 09/27/2018: Stage IB (cT1b, cN0, cM0, G3, ER-, PR-, HER2-) - Signed by Gardenia Phlegm, NP on 10/06/2018   10/06/2018 Initial Diagnosis   Screening mammogram detected right breast mass. Diagnostic mammogram showed 66m mass with adjacent 383mmass in the right breast with no axillary adenopathy. 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  The report date is 10/13/2018.  The Common Hereditary Gene Panel offered by [facility] includes sequencing and/or deletion duplication testing of the following 48 genes: APC, ATM, AXIN2, BARD1, BMPR1A, BRCA1, BRCA2, BRIP1, CDH1, CDK4, CDKN2A (p14ARF), CDKN2A (p16INK4a), CHEK2, CTNNA1, DICER1, EPCAM (Deletion/duplication testing only), GREM1 (promoter region deletion/duplication testing only), KIT, MEN1, MLH1, MSH2, MSH3, MSH6, MUTYH, NBN, NF1, NHTL1, PALB2, PDGFRA, PMS2, POLD1, POLE, PTEN, RAD50, RAD51C, RAD51D, RNF43, SDHB, SDHC, SDHD, SMAD4, SMARCA4. STK11, TP53, TSC1, TSC2, and VHL.  The following genes were evaluated for sequence changes only: SDHA  and HOXB13 c.251G>A variant only.    11/03/2018 Surgery   Right lumpectomy (Cornett) ([id number] IDC, 0.5cm, grade 3, clear margins, 2 axillary lymph nodes negative.  Triple negative with a Ki-67 of 90%   11/10/2018 Cancer Staging   Staging form: Breast, AJCC 8th Edition - Pathologic: Stage IB (pT1a, pN0, cM0, G3, ER-, PR-, HER2-) - Signed by GuNicholas LoseMD on 11/10/2018   12/28/2018 - 02/10/2019 Radiation Therapy   The patient initially received a dose of 50.4 Gy in 28 fractions to the breast using whole-breast tangent fields. This was delivered using a 3-D conformal technique. The patient then received a boost to the seroma. This delivered an additional 10 Gy in 5 fractions using a 3-field photon boost technique. The total dose was 60.4 Gy.     CHIEF COMPLIANT: Follow-up of right breast cancer    INTERVAL HISTORY: Kaitlyn CHANDAs a [age].o. with above-mentioned history of triple negative right breast cancer who underwent a lumpectomy, radiation, and is currently on surveillance. Mammogram on 09/07/19 showed no evidence of malignancy bilaterally. She presents to the clinic today for follow-up.  She complains of chronic back pain and is very anxious about whether it is related to her breast cancer.  ALLERGIES:  has No Known Allergies.  MEDICATIONS:  Current Outpatient Medications  Medication Sig Dispense Refill  . Erenumab-aooe (AIMOVIG) 140 MG/ML SOAJ Inject 140 mg into the skin every 30 (thirty) days. 1.12 mg   . ibuprofen (ADVIL) 800 MG tablet Take 1 tablet (800 mg total) by mouth every 8 (eight) hours as needed. (Patient not taking: Reported on 04/07/2019) 30 tablet 0  . Melatonin 10 MG TABS Take 1 tablet by mouth at bedtime as needed.    .Marland Kitchen  NUVARING 0.12-0.015 MG/24HR vaginal ring Place 1 each vaginally every 30 (thirty) days.    . pantoprazole (PROTONIX) 40 MG tablet Take 1 tablet (40 mg total) by mouth daily. 90 tablet 1  . promethazine (PHENERGAN) 25 MG tablet     . rizatriptan  (MAXALT) 10 MG tablet Take 10 mg by mouth as needed for migraine. May repeat in 2 hours if needed    . sertraline (ZOLOFT) 100 MG tablet Take 1.5 tablets (150 mg total) by mouth daily. PATIENT NEEDS OFFICE VISIT FOR ADDITIONAL REFILLS 45 tablet 0  . SUMAtriptan Succinate Refill 6 MG/0.5ML SOCT Inject 6 mg into the skin as needed. 15 mL   . topiramate (TOPAMAX) 100 MG tablet Take 100 mg by mouth daily.    . traMADol (ULTRAM) 50 MG tablet tramadol 50 mg tablet  Take 1 tablet every 6 hours by oral route.     No current facility-administered medications for this visit.    PHYSICAL EXAMINATION: ECOG PERFORMANCE STATUS: 1 - Symptomatic but completely ambulatory  Vitals:   09/08/19 0902  BP: 136/82  Pulse: 96  Resp: 18  Temp: 98.5 F (36.9 C)  SpO2: 99%   Filed Weights   09/08/19 0902  Weight: 226 lb (102.5 kg)    LABORATORY DATA:  I have reviewed the data as listed CMP Latest Ref Rng & Units 11/01/2018  Glucose 70 - 99 mg/dL 106(H)  BUN 6 - 20 mg/dL 10  Creatinine 0.44 - 1.00 mg/dL 0.97  Sodium 135 - 145 mmol/L 139  Potassium 3.5 - 5.1 mmol/L 3.7  Chloride 98 - 111 mmol/L 109  CO2 22 - 32 mmol/L 20(L)  Calcium 8.9 - 10.3 mg/dL 9.0  Total Protein 6.5 - 8.1 g/dL 6.7  Total Bilirubin 0.3 - 1.2 mg/dL 0.2(L)  Alkaline Phos 38 - 126 U/L 86  AST 15 - 41 U/L 17  ALT 0 - 44 U/L 15    Lab Results  Component Value Date   WBC 7.7 11/01/2018   HGB 15.5 (H) 11/01/2018   HCT 47.4 (H) 11/01/2018   MCV 87.5 11/01/2018   PLT 248 11/01/2018   NEUTROABS 4.0 11/01/2018    ASSESSMENT & PLAN:  Malignant neoplasm of lower-outer quadrant of right breast of female, estrogen receptor negative ([place]) 10/06/2018:Screening mammogram detected right breast mass. Diagnostic mammogram showed 52m mass with adjacent 365mmass in the right breast with no axillary adenopathy. 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  Recommendations: 1. Breast conserving  surgery8/07/2018: IDC 0.5 cm, grade 3, 0/2 lymph nodes negative, triple negative, Ki-67 90% 2.Adjuvant radiation therapy 12/29/2018-02/07/2019   Patient works as a [employer], applicationsnd her husband KeLennette Bihariorks as a poEngineer, structural---------------------------------------------------------------------------------------------------------------------------------------- Breast Cancer Surveillance: 1. Mammogram: 09/07/2019: Benign, density cat B 2. Breast Exam:  : Benign  Back pain and abdominal discomfort: We will obtain CT chest abdomen pelvis with contrast.  We will obtain a labs today in anticipation of her scans coming up.  We will call her with results of the CT scans.  Return to clinic in 6 months for follow-up and after that we can see her in 1 year.   No orders of the defined types were placed in this encounter.  The patient has a good understanding of the overall plan. she agrees with it. she will call with any problems that may develop before the next visit here.  Total time spent: 20 mins including face to face time and time spent  for planning, charting and coordination of care  Nicholas Lose, MD 09/08/2019  I, Cloyde Reams Dorshimer, am acting as scribe for Dr. Nicholas Lose.  I have reviewed the above documentation for accuracy and completeness, and I agree with the above.

## 2019-09-08 ENCOUNTER — Other Ambulatory Visit: Payer: Self-pay

## 2019-09-08 ENCOUNTER — Inpatient Hospital Stay: Payer: 59 | Attending: Hematology and Oncology | Admitting: Hematology and Oncology

## 2019-09-08 ENCOUNTER — Inpatient Hospital Stay: Payer: 59

## 2019-09-08 DIAGNOSIS — Z171 Estrogen receptor negative status [ER-]: Secondary | ICD-10-CM | POA: Diagnosis not present

## 2019-09-08 DIAGNOSIS — Z923 Personal history of irradiation: Secondary | ICD-10-CM | POA: Diagnosis not present

## 2019-09-08 DIAGNOSIS — C50511 Malignant neoplasm of lower-outer quadrant of right female breast: Secondary | ICD-10-CM

## 2019-09-08 DIAGNOSIS — Z793 Long term (current) use of hormonal contraceptives: Secondary | ICD-10-CM | POA: Diagnosis not present

## 2019-09-08 DIAGNOSIS — Z791 Long term (current) use of non-steroidal anti-inflammatories (NSAID): Secondary | ICD-10-CM | POA: Insufficient documentation

## 2019-09-08 DIAGNOSIS — Z79899 Other long term (current) drug therapy: Secondary | ICD-10-CM | POA: Insufficient documentation

## 2019-09-08 LAB — CMP (CANCER CENTER ONLY)
ALT: 19 U/L (ref 0–44)
AST: 16 U/L (ref 15–41)
Albumin: 3.3 g/dL — ABNORMAL LOW (ref 3.5–5.0)
Alkaline Phosphatase: 94 U/L (ref 38–126)
Anion gap: 10 (ref 5–15)
BUN: 10 mg/dL (ref 6–20)
CO2: 19 mmol/L — ABNORMAL LOW (ref 22–32)
Calcium: 9.3 mg/dL (ref 8.9–10.3)
Chloride: 110 mmol/L (ref 98–111)
Creatinine: 0.98 mg/dL (ref 0.44–1.00)
GFR, Est AFR Am: >60 mL/min (ref >60)
GFR, Estimated: >60 mL/min (ref >60)
Glucose, Bld: 98 mg/dL (ref 70–99)
Potassium: 4.2 mmol/L (ref 3.5–5.1)
Sodium: 139 mmol/L (ref 135–145)
Total Bilirubin: 0.2 mg/dL — ABNORMAL LOW (ref 0.3–1.2)
Total Protein: 7.1 g/dL (ref 6.5–8.1)

## 2019-09-08 MED ORDER — UBRELVY 100 MG PO TABS: 1.0000 | ORAL_TABLET | ORAL | Status: DC | PRN

## 2019-09-08 NOTE — Assessment & Plan Note (Signed)
10/06/2018:Screening mammogram detected right breast mass. Diagnostic mammogram showed 9m mass with adjacent 347mmass in the right breast with no axillary adenopathy. 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  Recommendations: 1. Breast conserving surgery8/07/2018: IDC 0.5 cm, grade 3, 0/2 lymph nodes negative, triple negative, Ki-67 90% 2.Adjuvant radiation therapy 12/29/2018-02/07/2019   Patient works as a [employer], applicationsnd her husband Kaitlyn Bihariorks as a poEngineer, structural---------------------------------------------------------------------------------------------------------------------------------------- Breast Cancer Surveillance: 1. Mammogram: 09/07/2019: Benign, density cat B 2. Breast Exam: 09/08/19: Benign  Return to clinic in 1 year for follow up

## 2019-09-16 ENCOUNTER — Other Ambulatory Visit: Payer: Self-pay

## 2019-09-16 ENCOUNTER — Ambulatory Visit (HOSPITAL_COMMUNITY)
Admission: RE | Admit: 2019-09-16 | Discharge: 2019-09-16 | Disposition: A | Discharge status: Home / Self Care | Payer: 59 | Source: Ambulatory Visit | Attending: Hematology and Oncology | Admitting: Hematology and Oncology

## 2019-09-16 DIAGNOSIS — Z171 Estrogen receptor negative status [ER-]: Secondary | ICD-10-CM | POA: Diagnosis present

## 2019-09-16 DIAGNOSIS — C50511 Malignant neoplasm of lower-outer quadrant of right female breast: Secondary | ICD-10-CM | POA: Diagnosis present

## 2019-09-16 MED ORDER — IOHEXOL 300 MG/ML  SOLN
100.0000 mL | Freq: Once | INTRAMUSCULAR | Status: AC | PRN
  Administered 2019-09-16: 100 mL via INTRAVENOUS

## 2019-09-16 MED ORDER — SODIUM CHLORIDE (PF) 0.9 % IJ SOLN
INTRAMUSCULAR | Status: AC
  Filled 2019-09-16: qty 50

## 2019-09-19 ENCOUNTER — Telehealth: Payer: Self-pay | Admitting: Hematology and Oncology

## 2019-09-19 NOTE — Telephone Encounter (Signed)
I left a voicemail for the patient that the CT chest abdomen pelvis did not show any evidence of distant metastatic disease.  Postradiation changes in the breast

## 2019-12-12 ENCOUNTER — Telehealth: Payer: Self-pay | Admitting: *Deleted

## 2019-12-12 NOTE — Progress Notes (Signed)
Patient Care Team: Annye English as PCP - General (Physician Assistant) Nicholas Lose, MD as Consulting Physician (Hematology and Oncology) Kyung Rudd, MD as Consulting Physician (Radiation Oncology) Erroll Luna, MD as Consulting Physician (General Surgery)  DIAGNOSIS:    ICD-10-CM   1. Malignant neoplasm of lower-outer quadrant of right breast of female, estrogen receptor negative ([place])  C50.511    Z17.1     SUMMARY OF ONCOLOGIC HISTORY: Oncology History  Malignant neoplasm of lower-outer quadrant of right breast of female, estrogen receptor negative ([place])  09/27/2018 Cancer Staging   Staging form: Breast, AJCC 8th Edition - Clinical stage from 09/27/2018: Stage IB (cT1b, cN0, cM0, G3, ER-, PR-, HER2-) - Signed by Gardenia Phlegm, NP on 10/06/2018   10/06/2018 Initial Diagnosis   Screening mammogram detected right breast mass. Diagnostic mammogram showed 10m mass with adjacent 338mmass in the right breast with no axillary adenopathy. 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  The report date is 10/13/2018.  The Common Hereditary Gene Panel offered by [facility] includes sequencing and/or deletion duplication testing of the following 48 genes: APC, ATM, AXIN2, BARD1, BMPR1A, BRCA1, BRCA2, BRIP1, CDH1, CDK4, CDKN2A (p14ARF), CDKN2A (p16INK4a), CHEK2, CTNNA1, DICER1, EPCAM (Deletion/duplication testing only), GREM1 (promoter region deletion/duplication testing only), KIT, MEN1, MLH1, MSH2, MSH3, MSH6, MUTYH, NBN, NF1, NHTL1, PALB2, PDGFRA, PMS2, POLD1, POLE, PTEN, RAD50, RAD51C, RAD51D, RNF43, SDHB, SDHC, SDHD, SMAD4, SMARCA4. STK11, TP53, TSC1, TSC2, and VHL.  The following genes were evaluated for sequence changes only: SDHA  and HOXB13 c.251G>A variant only.    11/03/2018 Surgery   Right lumpectomy ([place]) ([phone] IDC, 0.5cm, grade 3, clear margins, 2 axillary lymph nodes negative.  Triple negative with a Ki-67 of 90%   11/10/2018 Cancer Staging   Staging form: Breast, AJCC 8th Edition - Pathologic: Stage IB (pT1a, pN0, cM0, G3, ER-, PR-, HER2-) - Signed by GuNicholas LoseMD on 11/10/2018   12/28/2018 - 02/10/2019 Radiation Therapy   The patient initially received a dose of 50.4 Gy in 28 fractions to the breast using whole-breast tangent fields. This was delivered using a 3-D conformal technique. The patient then received a boost to the seroma. This delivered an additional 10 Gy in 5 fractions using a 3-field photon boost technique. The total dose was 60.4 Gy.     CHIEF COMPLIANT: Follow-up of right breast cancer, right breast swelling and open sore  INTERVAL HISTORY: Kaitlyn ARTIAGAs a [age].o. with above-mentioned history of triple negative right breast cancerwhounderwent a lumpectomy, radiation, and is currently on surveillance. She presents to the clinic todayfor examination of right breast swelling and an open sore.  The sore is the size of a BB and it has been leaking for the past several days only this morning it stopped leakage.  She showed me some of the pads that are usKoreaoaked in a serous fluid.  She tells me initially it was clear and now it slightly yellow.  Denies any fevers or chills.  Denies any tenderness or warmth or redness.  ALLERGIES:  has No Known Allergies.  MEDICATIONS:  Current Outpatient Medications  Medication Sig Dispense Refill  . Erenumab-aooe (AIMOVIG) 140 MG/ML SOAJ Inject 140 mg into the skin every 30 (thirty) days. 1.12 mg   .  Melatonin 10 MG TABS Take 1 tablet by mouth at bedtime as needed.    Marland Kitchen NUVARING 0.12-0.015 MG/24HR vaginal ring Place 1 each vaginally every 30 (thirty) days.    . pantoprazole (PROTONIX) 40 MG tablet Take 1 tablet (40 mg total) by mouth daily. 90  tablet 1  . promethazine (PHENERGAN) 25 MG tablet     . rizatriptan (MAXALT) 10 MG tablet Take 10 mg by mouth as needed for migraine. May repeat in 2 hours if needed    . sertraline (ZOLOFT) 100 MG tablet Take 1.5 tablets (150 mg total) by mouth daily. PATIENT NEEDS OFFICE VISIT FOR ADDITIONAL REFILLS 45 tablet 0  . SUMAtriptan Succinate Refill 6 MG/0.5ML SOCT Inject 6 mg into the skin as needed. 15 mL   . topiramate (TOPAMAX) 100 MG tablet Take 100 mg by mouth daily.    . traMADol (ULTRAM) 50 MG tablet tramadol 50 mg tablet  Take 1 tablet every 6 hours by oral route.    Marland Kitchen Ubrogepant (UBRELVY) 100 MG TABS Take 1 tablet by mouth as needed.     No current facility-administered medications for this visit.    PHYSICAL EXAMINATION: ECOG PERFORMANCE STATUS: 1 - Symptomatic but completely ambulatory  Vitals:   12/13/19 1020  BP: (!) 142/97  Pulse: 90  Resp: 16  Temp: 97.9 F (36.6 C)  SpO2: 99%   Filed Weights   12/13/19 1020  Weight: 227 lb 14.4 oz (103.4 kg)    BREAST: Very small skin change in the medial aspect of the right breast.  No leakage of fluid with compression of that area.  There is no redness or tenderness or warmth.  (exam performed in the presence of a chaperone)  LABORATORY DATA:  I have reviewed the data as listed CMP Latest Ref Rng & Units 09/08/2019 11/01/2018  Glucose 70 - 99 mg/dL 98 106(H)  BUN 6 - 20 mg/dL 10 10  Creatinine 0.44 - 1.00 mg/dL 0.98 0.97  Sodium 135 - 145 mmol/L 139 139  Potassium 3.5 - 5.1 mmol/L 4.2 3.7  Chloride 98 - 111 mmol/L 110 109  CO2 22 - 32 mmol/L 19(L) 20(L)  Calcium 8.9 - 10.3 mg/dL 9.3 9.0  Total Protein 6.5 - 8.1 g/dL 7.1 6.7  Total Bilirubin 0.3 - 1.2 mg/dL 0.2(L) 0.2(L)  Alkaline Phos 38 - 126 U/L 94 86  AST 15 - 41 U/L 16 17  ALT 0 - 44 U/L 19 15    Lab Results  Component Value Date   WBC 7.7 11/01/2018   HGB 15.5 (H) 11/01/2018   HCT 47.4 (H) 11/01/2018   MCV 87.5 11/01/2018   PLT 248 11/01/2018   NEUTROABS 4.0  11/01/2018    ASSESSMENT & PLAN:  Malignant neoplasm of lower-outer quadrant of right breast of female, estrogen receptor negative ([place]) 10/06/2018:Screening mammogram detected right breast mass. Diagnostic mammogram showed 27m mass with adjacent 330mmass in the right breast with no axillary adenopathy. 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  Recommendations: 1. 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 Mammogram: 09/07/2019: Benign, density cat B 2. Breast Exam:  : Benign  Back pain and abdominal discomfort: CT CAP: No evidence of metastatic disease. Right breast swelling with an open sore: It is very likely  related to lymphedema.  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 The leakage is finally subsided.  Return to clinic in  December for follow-up    No orders of the defined types were placed in this encounter.  The patient has a good understanding of the overall plan. she agrees with it. she will call with any problems that may develop before the next visit here.  Total time spent: 30 mins including face to face time and time spent for planning, charting and coordination of care  Nicholas Lose, MD 12/13/2019  I, Cloyde Reams Dorshimer, am acting as scribe for Dr. Nicholas Lose.  I have reviewed the above documentation for accuracy and completeness, and I agree with the above.

## 2019-12-12 NOTE — Telephone Encounter (Signed)
Received VM from pt with complaint of right breast swelling with an open sore that is seeping clear liquid.  Attempt x1 to contact pt to get her scheduled to be seen by MD this week.  No answer, LVM to return call to the office.

## 2019-12-13 ENCOUNTER — Other Ambulatory Visit: Payer: Self-pay

## 2019-12-13 ENCOUNTER — Inpatient Hospital Stay: Payer: 59 | Attending: Hematology and Oncology | Admitting: Hematology and Oncology

## 2019-12-13 DIAGNOSIS — Z79899 Other long term (current) drug therapy: Secondary | ICD-10-CM | POA: Diagnosis not present

## 2019-12-13 DIAGNOSIS — C50511 Malignant neoplasm of lower-outer quadrant of right female breast: Secondary | ICD-10-CM

## 2019-12-13 DIAGNOSIS — Z171 Estrogen receptor negative status [ER-]: Secondary | ICD-10-CM

## 2019-12-13 DIAGNOSIS — Z923 Personal history of irradiation: Secondary | ICD-10-CM | POA: Diagnosis not present

## 2019-12-13 DIAGNOSIS — Z793 Long term (current) use of hormonal contraceptives: Secondary | ICD-10-CM | POA: Insufficient documentation

## 2019-12-13 NOTE — Assessment & Plan Note (Signed)
10/06/2018:Screening mammogram detected right breast mass. Diagnostic mammogram showed 10m mass with adjacent 372mmass in the right breast with no axillary adenopathy. 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  Recommendations: 1. 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 Mammogram: 09/07/2019: Benign, density cat B 2. Breast Exam:  : Benign  Back pain and abdominal discomfort: CT CAP: No evidence of metastatic disease. Right breast swelling with an open sore: Instructed her to follow-up with surgery.  Return to clinic in  1 year for follow-up

## 2019-12-14 ENCOUNTER — Telehealth: Payer: Self-pay | Admitting: Hematology and Oncology

## 2019-12-14 NOTE — Telephone Encounter (Signed)
No 9/14 los, no changes made to pt schedule

## 2019-12-29 ENCOUNTER — Encounter: Payer: Self-pay | Admitting: Rehabilitation

## 2019-12-29 ENCOUNTER — Ambulatory Visit: Payer: 59 | Attending: Hematology and Oncology | Admitting: Rehabilitation

## 2019-12-29 ENCOUNTER — Other Ambulatory Visit: Payer: Self-pay

## 2019-12-29 DIAGNOSIS — Z171 Estrogen receptor negative status [ER-]: Secondary | ICD-10-CM | POA: Diagnosis present

## 2019-12-29 DIAGNOSIS — C50511 Malignant neoplasm of lower-outer quadrant of right female breast: Secondary | ICD-10-CM | POA: Insufficient documentation

## 2019-12-29 DIAGNOSIS — I890 Lymphedema, not elsewhere classified: Secondary | ICD-10-CM | POA: Diagnosis not present

## 2019-12-29 DIAGNOSIS — R293 Abnormal posture: Secondary | ICD-10-CM | POA: Diagnosis present

## 2019-12-29 NOTE — Patient Instructions (Signed)

## 2019-12-29 NOTE — Therapy (Signed)
[place], [place], [address] Phone: [phone]   Fax:  [phone]  Physical Therapy Evaluation  Patient Details  Name: [name] MRN: [id number] Date of Birth: [birth year] Referring Provider (PT): Dr. Lindi Adie   Encounter Date: 12/29/2019   PT End of Session - 12/29/19 1752    Visit Number 1    Number of Visits 13    Date for PT Re-Evaluation 02/09/20    PT Start Time 1502    PT Stop Time 1559    PT Time Calculation (min) 57 min    Activity Tolerance Patient tolerated treatment well    Behavior During Therapy [facility] for tasks assessed/performed           Past Medical History:  Diagnosis Date  . Allergy   . Breast cancer ([place]) 08/2018   Right breast cancer  . Depression   . Family history of cervical cancer   . Family history of melanoma   . Family history of prostate cancer   . GERD (gastroesophageal reflux disease)   . Migraine   . Personal history of radiation therapy     Past Surgical History:  Procedure Laterality Date  . ADENOIDECTOMY    . APPENDECTOMY    . BREAST LUMPECTOMY    . BREAST LUMPECTOMY WITH RADIOACTIVE SEED AND SENTINEL LYMPH NODE BIOPSY Right 11/03/2018   Procedure: RIGHT BREAST LUMPECTOMY WITH RADIOACTIVE SEED AND RIGHT SENTINEL LYMPH NODE MAPPING;  Surgeon: Erroll Luna, MD;  Location: [facility];  Service: General;  Laterality: Right;  . CESAREAN SECTION    . OPERATIVE ULTRASOUND Right 11/03/2018   Procedure: OPERATIVE ULTRASOUND;  Surgeon: Erroll Luna, MD;  Location: [facility];  Service: General;  Laterality: Right;  . PORT-A-CATH REMOVAL N/A 12/09/2018   Procedure: PORT REMOVAL;  Surgeon: Erroll Luna, MD;  Location: [facility];  Service: General;  Laterality: N/A;  . PORTACATH PLACEMENT Right 11/03/2018   Procedure: INSERTION PORT-A-CATH;  Surgeon: Erroll Luna, MD;  Location: [facility];   Service: General;  Laterality: Right;    There were no vitals filed for this visit.    Subjective Assessment - 12/29/19 1458    Subjective The spot leaked x 2 days clear fluid bad enough to soak my sheet.  The issue is more the fullness in the front of the breast.  I have a compression bra and 2 more that support more. I never got the swell spot.    Pertinent History Rt breast lumpectomy 11/03/18 due to triple negative breast cancer with completion of radiation with boost to seroma location.  Treatment here for breast lymphedema and recent open spot that was draining on the breast. Other health history:    Patient Stated Goals Reduce the swelling    Currently in Pain? No/denies   I do get achy pain in the breast during the day             [facility] PT Assessment - 12/29/19 0001      Assessment   Medical Diagnosis Rt breast cancer    Referring Provider (PT) Dr. Lindi Adie    Onset Date/Surgical Date 11/03/18    Hand Dominance Right    Prior Therapy yes      Precautions   Precaution Comments lymphedema precautions      Restrictions   Weight Bearing Restrictions No      Balance Screen   Has the patient fallen in the past 6 months  No    Has the patient had a decrease in activity level because of a fear of falling?  No    Is the patient reluctant to leave their home because of a fear of falling?  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      Cognition   Overall Cognitive Status Within Functional Limits for tasks assessed      Observation/Other Assessments   Observations Rt breast higher and darker, enlarged pores medial breast, skin tightness evident    Skin Integrity red discoloration from radiation healing in axilla and under the breast        Posture/Postural Control   Posture/Postural Control Postural limitations    Postural Limitations Rounded Shoulders;Forward head      ROM / Strength   AROM / PROM / Strength AROM      AROM   Overall AROM Comments WNL bil                       Outpatient [facility]29/[facility]  Lymphedema Life Impact Scale Total Score 39.71 %      Objective measurements completed on examination: See above findings.       [facility] Adult PT Treatment/Exercise - 12/29/19 0001      Manual Therapy   Manual Therapy Manual Lymphatic Drainage (MLD);Edema management    Edema Management gave pt ordering information for swell spot Largest size to try in bra. sent information to [facility] after discussing this for edema in the breast    Manual Lymphatic Drainage (MLD) PT performed with pt permission: short neck, bil axillray nodes, right inguinal nodes, anterior interaxillary anastamosis, Rt axillo inuinal anastamosis, then Right breast working towards pathways and review with patient on sequencing as pt focused more on the lateral breast last time.                    PT Education - 12/29/19 1752    Education Details self MLD breast    Person(s) Educated Patient    Methods Explanation;Demonstration;Tactile cues;Handout;Verbal cues    Comprehension Verbalized understanding;Returned demonstration;Verbal cues required;Tactile cues required;Need further instruction               PT Long Term Goals - 12/29/19 1757      PT LONG TERM GOAL #1   Title Pt will report 50% improvement in symptoms in the R breast for improve quality of life.    Time 6    Period Weeks    Status New      PT LONG TERM GOAL #2   Title Pt will be ind with self MLD or use of pump to improve skin condition    Time 6    Period Weeks    Status New      PT LONG TERM GOAL #3   Title NA      PT LONG TERM GOAL #4   Title NA                  Plan -  12/29/19 1753    Clinical Impression Statement Pt returns to PT after therapy here previously with new whole breast fullness and discomfort vs  lateral breast issues at last visits.  Pt even had a spot open on the medial breast that was leaking a large amount of lymphatic / serous fluid which has now stopped.  Pt with overall general edema of the Rt breast with discomfort and fibrosis medial and inferior breast towards radiation damaged skin.  pt will benefit from Rt breast MLD until independent with pump and garments and to decrease breast discomfort.    Personal Factors and Comorbidities Comorbidity 2    Comorbidities radiation history, SLNB    Stability/Clinical Decision Making Evolving/Moderate complexity    Clinical Decision Making Low    Rehab Potential Good    PT Frequency 2x / week    PT Duration 6 weeks    PT Treatment/Interventions ADLs/Self Care Home Management;Therapeutic exercise;Patient/family education;Manual techniques;Manual lymph drainage;Compression bandaging;Passive range of motion;Taping;Vasopneumatic Device    PT Next Visit Plan continue right breast MLD, any questions on self MLD?, try sleeping in bra? order pad for bra?    PT Home Exercise Plan self MLD    Recommended Other Services send info to [facility] 12/29/19    Consulted and Agree with Plan of Care Patient           Patient will benefit from skilled therapeutic intervention in order to improve the following deficits and impairments:  Decreased skin integrity, Increased edema  Visit Diagnosis: Lymphedema, not elsewhere classified  Malignant neoplasm of lower-outer quadrant of right breast of female, estrogen receptor negative ([place])  Abnormal posture     Problem List Patient Active Problem List   Diagnosis Date Noted  . Genetic testing 10/13/2018  . Family history of melanoma   . Family history of prostate cancer   . Family history of cervical cancer   . Malignant neoplasm of lower-outer quadrant of  right breast of female, estrogen receptor negative ([place]) 10/06/2018  . Obesity, Class II, BMI 35-39.9 02/02/2018  . Elevated LDL cholesterol level 01/05/2017  . Dermatitis 01/31/2016  . Migraine without aura 04/21/2011  . Esophageal reflux 04/21/2011  . Depression with anxiety 04/21/2011  . Major depressive disorder, single episode 04/21/2011    Stark Bray 12/29/2019, 5:58 PM  [place], [place], [address] Phone: [phone]   Fax:  [phone]  Name: [name] MRN: [id number] Date of Birth: 03-05-1976

## 2020-01-11 ENCOUNTER — Ambulatory Visit: Payer: 59 | Attending: Hematology and Oncology

## 2020-01-11 ENCOUNTER — Other Ambulatory Visit: Payer: Self-pay

## 2020-01-11 DIAGNOSIS — I890 Lymphedema, not elsewhere classified: Secondary | ICD-10-CM

## 2020-01-11 DIAGNOSIS — R293 Abnormal posture: Secondary | ICD-10-CM | POA: Diagnosis present

## 2020-01-11 DIAGNOSIS — Z171 Estrogen receptor negative status [ER-]: Secondary | ICD-10-CM | POA: Diagnosis present

## 2020-01-11 DIAGNOSIS — C50511 Malignant neoplasm of lower-outer quadrant of right female breast: Secondary | ICD-10-CM | POA: Insufficient documentation

## 2020-01-11 NOTE — Therapy (Signed)
[place], [place], [address] Phone: [phone]   Fax:  [phone]  Physical Therapy Treatment  Patient Details  Name: [name] MRN: [id number] Date of Birth: 1975/08/04 Referring Provider (PT): Dr. Lindi Adie   Encounter Date: 01/11/2020   PT End of Session - 01/11/20 1108    Visit Number 2    Number of Visits 13    Date for PT Re-Evaluation 02/09/20    PT Start Time 1011    PT Stop Time 1107    PT Time Calculation (min) 56 min    Activity Tolerance Patient tolerated treatment well    Behavior During Therapy [facility] for tasks assessed/performed           Past Medical History:  Diagnosis Date  . Allergy   . Breast cancer ([place]) 08/2018   Right breast cancer  . Depression   . Family history of cervical cancer   . Family history of melanoma   . Family history of prostate cancer   . GERD (gastroesophageal reflux disease)   . Migraine   . Personal history of radiation therapy     Past Surgical History:  Procedure Laterality Date  . ADENOIDECTOMY    . APPENDECTOMY    . BREAST LUMPECTOMY    . BREAST LUMPECTOMY WITH RADIOACTIVE SEED AND SENTINEL LYMPH NODE BIOPSY Right 11/03/2018   Procedure: RIGHT BREAST LUMPECTOMY WITH RADIOACTIVE SEED AND RIGHT SENTINEL LYMPH NODE MAPPING;  Surgeon: Erroll Luna, MD;  Location: [place];  Service: General;  Laterality: Right;  . CESAREAN SECTION    . OPERATIVE ULTRASOUND Right 11/03/2018   Procedure: OPERATIVE ULTRASOUND;  Surgeon: Erroll Luna, MD;  Location: [place];  Service: General;  Laterality: Right;  . PORT-A-CATH REMOVAL N/A 12/09/2018   Procedure: PORT REMOVAL;  Surgeon: Erroll Luna, MD;  Location: [facility];  Service: General;  Laterality: N/A;  . PORTACATH PLACEMENT Right 11/03/2018   Procedure: INSERTION PORT-A-CATH;  Surgeon: Erroll Luna, MD;  Location: [place];   Service: General;  Laterality: Right;    There were no vitals filed for this visit.   Subjective Assessment - 01/11/20 1011    Subjective I ordered my swell spot and that just came in so I haven't got an opportunity to try wearing it yet. Other than that, no changes from evaluation.    Pertinent History Rt breast lumpectomy 11/03/18 due to triple negative breast cancer with completion of radiation with boost to seroma location.  Treatment here for breast lymphedema and recent open spot that was draining on the breast. Other health history:    Patient Stated Goals Reduce the swelling    Currently in Pain? No/denies                       Outpatient [facility]29/2019 [facility]  Lymphedema Life Impact Scale Total Score 39.71 %            [facility] Adult PT Treatment/Exercise - 01/11/20 0001      Manual Therapy   Manual Lymphatic Drainage (MLD) Short neck, superficial and deep abdominals, Lt axillray nodes, right inguinal nodes, anterior interaxillary anastamosis, Rt axillo inuinal anastamosis, then Right breast working towards pathways; then into Lt S/L for further work to lateral aspect of breast, redirecting towards Rt axillo-inguinal and posterior inter-axillary anasotmosis, then finished retracing steps in supine  PT Long Term Goals - 12/29/19 1757      PT LONG TERM GOAL #1   Title Pt will report 50% improvement in symptoms in the R breast for improve quality of life.    Time 6    Period Weeks    Status New      PT LONG TERM GOAL #2   Title Pt will be ind with self MLD or use of pump to improve skin condition    Time 6    Period Weeks    Status New      PT LONG TERM GOAL #3   Title NA      PT LONG TERM GOAL #4   Title NA                 Plan - 01/11/20 1108    Clinical Impression Statement Began with resuming manual lymph drainage of Rt breast as was done at last episode of  care. From when this therapist saw her last there is less fibrosis at lateral aspect of breast. There was, however, increased radiation fibrosis inferior to bresat fold onto anterior trunk where pt suffered most trauma visibly from radiation. Good softening was noted here by end of session. Pt has gotten her swell spot for breast but not worn it yet as she reports not sure how as her compression bra is very well fitting. Suggested using a sports bra that wasn't as form fitting so she could potentially sleep with this at least and pt verbalized that would work well with a sports bra she already has.    Personal Factors and Comorbidities Comorbidity 2    Comorbidities radiation history, SLNB    Stability/Clinical Decision Making Evolving/Moderate complexity    Rehab Potential Good    PT Frequency 2x / week    PT Duration 6 weeks    PT Treatment/Interventions ADLs/Self Care Home Management;Therapeutic exercise;Patient/family education;Manual techniques;Manual lymph drainage;Compression bandaging;Passive range of motion;Taping;Vasopneumatic Device    PT Next Visit Plan continue right breast MLD, any questions on self MLD?, try sleeping in bra with swell spot?    PT Home Exercise Plan self MLD; sleep in sports bra with swell spot    Consulted and Agree with Plan of Care Patient           Patient will benefit from skilled therapeutic intervention in order to improve the following deficits and impairments:  Decreased skin integrity, Increased edema  Visit Diagnosis: Lymphedema, not elsewhere classified  Malignant neoplasm of lower-outer quadrant of right breast of female, estrogen receptor negative ([place])  Abnormal posture     Problem List Patient Active Problem List   Diagnosis Date Noted  . Genetic testing 10/13/2018  . Family history of melanoma   . Family history of prostate cancer   . Family history of cervical cancer   . Malignant neoplasm of lower-outer quadrant of right breast of  female, estrogen receptor negative ([place]) 10/06/2018  . Obesity, Class II, BMI 35-39.9 02/02/2018  . Elevated LDL cholesterol level 01/05/2017  . Dermatitis 01/31/2016  . Migraine without aura 04/21/2011  . Esophageal reflux 04/21/2011  . Depression with anxiety 04/21/2011  . Major depressive disorder, single episode 04/21/2011    Otelia Limes, PTA 01/11/2020, 12:25 PM  [place], [place], [address] Phone: [phone]   Fax:  [phone]  Name: [name] MRN: [id number] Date of Birth: [birth year]

## 2020-01-17 ENCOUNTER — Other Ambulatory Visit: Payer: Self-pay

## 2020-01-17 ENCOUNTER — Ambulatory Visit: Payer: 59 | Admitting: Rehabilitation

## 2020-01-17 ENCOUNTER — Encounter: Payer: Self-pay | Admitting: Rehabilitation

## 2020-01-17 DIAGNOSIS — Z171 Estrogen receptor negative status [ER-]: Secondary | ICD-10-CM

## 2020-01-17 DIAGNOSIS — I890 Lymphedema, not elsewhere classified: Secondary | ICD-10-CM

## 2020-01-17 DIAGNOSIS — R293 Abnormal posture: Secondary | ICD-10-CM

## 2020-01-17 NOTE — Therapy (Signed)
[place], [place], [address] Phone: [phone]   Fax:  [phone]  Physical Therapy Treatment  Patient Details  Name: [name] MRN: [id number] Date of Birth: 15-Apr-1975 Referring Provider (PT): Dr. Lindi Adie   Encounter Date: 01/17/2020   PT End of Session - 01/17/20 0947    Visit Number 3    Number of Visits 13    Date for PT Re-Evaluation 02/09/20    PT Start Time 0900    PT Stop Time 0949    PT Time Calculation (min) 49 min    Activity Tolerance Patient tolerated treatment well    Behavior During Therapy [facility] for tasks assessed/performed           Past Medical History:  Diagnosis Date  . Allergy   . Breast cancer ([place]) 08/2018   Right breast cancer  . Depression   . Family history of cervical cancer   . Family history of melanoma   . Family history of prostate cancer   . GERD (gastroesophageal reflux disease)   . Migraine   . Personal history of radiation therapy     Past Surgical History:  Procedure Laterality Date  . ADENOIDECTOMY    . APPENDECTOMY    . BREAST LUMPECTOMY    . BREAST LUMPECTOMY WITH RADIOACTIVE SEED AND SENTINEL LYMPH NODE BIOPSY Right 11/03/2018   Procedure: RIGHT BREAST LUMPECTOMY WITH RADIOACTIVE SEED AND RIGHT SENTINEL LYMPH NODE MAPPING;  Surgeon: Erroll Luna, MD;  Location: [place];  Service: General;  Laterality: Right;  . CESAREAN SECTION    . OPERATIVE ULTRASOUND Right 11/03/2018   Procedure: OPERATIVE ULTRASOUND;  Surgeon: Erroll Luna, MD;  Location: [place];  Service: General;  Laterality: Right;  . PORT-A-CATH REMOVAL N/A 12/09/2018   Procedure: PORT REMOVAL;  Surgeon: Erroll Luna, MD;  Location: [place];  Service: General;  Laterality: N/A;  . PORTACATH PLACEMENT Right 11/03/2018   Procedure: INSERTION PORT-A-CATH;  Surgeon: Erroll Luna, MD;  Location: [place];   Service: General;  Laterality: Right;    There were no vitals filed for this visit.   Subjective Assessment - 01/17/20 0900    Subjective The pump people contacted me and said it was still processing.  They are supposed to get call every week to let me know.  The swell spot fits in a regular sports bra but I have not tried it yet.    Pertinent History Rt breast lumpectomy 11/03/18 due to triple negative breast cancer with completion of radiation with boost to seroma location.  Treatment here for breast lymphedema and recent open spot that was draining on the breast. Other health history:    Patient Stated Goals Reduce the swelling    Currently in Pain? No/denies                       [facility]  Lymphedema Life Impact Scale Total Score 39.71 %            [facility] Adult PT Treatment/Exercise - 01/17/20 0001      Manual Therapy   Manual Therapy Soft tissue mobilization    Soft tissue mobilization using coconut oil in sidelying ot the lateral breast and fibrosis for gentle release    Manual Lymphatic Drainage (MLD) Short neck, superficial and deep abdominals, Lt axillray nodes, right inguinal nodes, anterior interaxillary anastamosis, Rt axillo inuinal anastamosis,  then Right breast working towards pathways; then into Lt S/L for further work to lateral aspect of breast, redirecting towards Rt axillo-inguinal and posterior inter-axillary anasotmosis, then finished retracing steps in supine                       PT Long Term Goals - 12/29/19 1757      PT LONG TERM GOAL #1   Title Pt will report 50% improvement in symptoms in the R breast for improve quality of life.    Time 6    Period Weeks    Status New      PT LONG TERM GOAL #2   Title Pt will be ind with self MLD or use of pump to improve skin condition    Time 6    Period Weeks    Status New      PT LONG TERM GOAL #3   Title NA       PT LONG TERM GOAL #4   Title NA                 Plan - 01/17/20 0949    Clinical Impression Statement Continued with MLD to the Rt breast and added some STM in sidelying to the lateral breast.  Pt overall is less edematous compared to evaluation with less tightness to the skin but it is also early in the morning.    PT Frequency 2x / week    PT Duration 6 weeks    PT Treatment/Interventions ADLs/Self Care Home Management;Therapeutic exercise;Patient/family education;Manual techniques;Manual lymph drainage;Compression bandaging;Passive range of motion;Taping;Vasopneumatic Device    PT Next Visit Plan continue right breast MLD, any questions on self MLD?, try sleeping in bra with swell spot?    Consulted and Agree with Plan of Care Patient           Patient will benefit from skilled therapeutic intervention in order to improve the following deficits and impairments:     Visit Diagnosis: Lymphedema, not elsewhere classified  Malignant neoplasm of lower-outer quadrant of right breast of female, estrogen receptor negative ([place])  Abnormal posture     Problem List Patient Active Problem List   Diagnosis Date Noted  . Genetic testing 10/13/2018  . Family history of melanoma   . Family history of prostate cancer   . Family history of cervical cancer   . Malignant neoplasm of lower-outer quadrant of right breast of female, estrogen receptor negative ([place]) 10/06/2018  . Obesity, Class II, BMI 35-39.9 02/02/2018  . Elevated LDL cholesterol level 01/05/2017  . Dermatitis 01/31/2016  . Migraine without aura 04/21/2011  . Esophageal reflux 04/21/2011  . Depression with anxiety 04/21/2011  . Major depressive disorder, single episode 04/21/2011    Stark Bray 01/17/2020, 9:51 AM  [place], [place], [address] Phone: [phone]   Fax:  [phone]  Name: [name] MRN:  [id number] Date of Birth: June 25, 1975

## 2020-01-20 ENCOUNTER — Encounter: Payer: Self-pay | Admitting: Rehabilitation

## 2020-01-20 ENCOUNTER — Other Ambulatory Visit: Payer: Self-pay

## 2020-01-20 ENCOUNTER — Ambulatory Visit: Payer: 59 | Admitting: Rehabilitation

## 2020-01-20 DIAGNOSIS — I890 Lymphedema, not elsewhere classified: Secondary | ICD-10-CM | POA: Diagnosis not present

## 2020-01-20 DIAGNOSIS — R293 Abnormal posture: Secondary | ICD-10-CM

## 2020-01-20 DIAGNOSIS — C50511 Malignant neoplasm of lower-outer quadrant of right female breast: Secondary | ICD-10-CM

## 2020-01-20 NOTE — Therapy (Signed)
[place], [place], [address] Phone: [phone]   Fax:  [phone]  Physical Therapy Treatment  Patient Details  Name: [name] MRN: [id number] Date of Birth: [birth year] Referring Provider (PT): Dr. Lindi Adie   Encounter Date: 01/20/2020   PT End of Session - 01/20/20 1053    Visit Number 4    Number of Visits 13    Date for PT Re-Evaluation 02/09/20    PT Start Time 1003    PT Stop Time 1051    PT Time Calculation (min) 48 min    Activity Tolerance Patient tolerated treatment well    Behavior During Therapy [name] for tasks assessed/performed           Past Medical History:  Diagnosis Date  . Allergy   . Breast cancer ([place]) 08/2018   Right breast cancer  . Depression   . Family history of cervical cancer   . Family history of melanoma   . Family history of prostate cancer   . GERD (gastroesophageal reflux disease)   . Migraine   . Personal history of radiation therapy     Past Surgical History:  Procedure Laterality Date  . ADENOIDECTOMY    . APPENDECTOMY    . BREAST LUMPECTOMY    . BREAST LUMPECTOMY WITH RADIOACTIVE SEED AND SENTINEL LYMPH NODE BIOPSY Right 11/03/2018   Procedure: RIGHT BREAST LUMPECTOMY WITH RADIOACTIVE SEED AND RIGHT SENTINEL LYMPH NODE MAPPING;  Surgeon: Erroll Luna, MD;  Location: [place];  Service: General;  Laterality: Right;  . CESAREAN SECTION    . OPERATIVE ULTRASOUND Right 11/03/2018   Procedure: OPERATIVE ULTRASOUND;  Surgeon: Erroll Luna, MD;  Location: [place];  Service: General;  Laterality: Right;  . PORT-A-CATH REMOVAL N/A 12/09/2018   Procedure: PORT REMOVAL;  Surgeon: Erroll Luna, MD;  Location: [place];  Service: General;  Laterality: N/A;  . PORTACATH PLACEMENT Right 11/03/2018   Procedure: INSERTION PORT-A-CATH;  Surgeon: Erroll Luna, MD;  Location: [place];   Service: General;  Laterality: Right;    There were no vitals filed for this visit.   Subjective Assessment - 01/20/20 1005    Subjective I still haven't tried the swell spot. More discomfort in the evening after work it just feels so hard on the front of the breast    Pertinent History Rt breast lumpectomy 11/03/18 due to triple negative breast cancer with completion of radiation with boost to seroma location.  Treatment here for breast lymphedema and recent open spot that was draining on the breast. Other health history:    Patient Stated Goals Reduce the swelling    Currently in Pain? No/denies                       [facility]patient [facility]  Lymphedema Life Impact Scale Total Score 39.71 %            [facility] Adult PT Treatment/Exercise - 01/20/20 0001      Manual Therapy   Edema Management pt was given XXL prairie bra from clinic to try at home as her current ABC bra is very low cut.  [facility] who said they would most likely be able to over her bras so she will make an appt over there and return prairie bra     Soft tissue mobilization using cocoa butter in sidelying ot the  lateral breast and fibrosis for gentle release    Manual Lymphatic Drainage (MLD) Short neck, superficial and deep abdominals, Lt axillray nodes, right inguinal nodes, anterior interaxillary anastamosis, Rt axillo inuinal anastamosis, then Right breast working towards pathways; then into Lt S/L for further work to lateral aspect of breast, redirecting towards Rt axillo-inguinal and posterior inter-axillary anasotmosis, then finished retracing steps in supine                       PT Long Term Goals - 12/29/19 1757      PT LONG TERM GOAL #1   Title Pt will report 50% improvement in symptoms in the R breast for improve quality of life.    Time 6    Period Weeks    Status New      PT LONG TERM GOAL #2   Title  Pt will be ind with self MLD or use of pump to improve skin condition    Time 6    Period Weeks    Status New      PT LONG TERM GOAL #3   Title NA      PT LONG TERM GOAL #4   Title NA                 Plan - 01/20/20 1053    Clinical Impression Statement Pt continues with breast discomfort and hardness in the evening.  Discussed compression bra in more detail today which seems to be low cut so pt was given clinic pairie bra to use at home with swell spot and [facility] as they said they would take [name].  Pt continues with general edema and lateral/inferior radiation fibrosis but no overall pain to palpation.    PT Frequency 2x / week    PT Duration 6 weeks    PT Treatment/Interventions ADLs/Self Care Home Management;Therapeutic exercise;Patient/family education;Manual techniques;Manual lymph drainage;Compression bandaging;Passive range of motion;Taping;Vasopneumatic Device    PT Next Visit Plan bra appt? try swell spot? continue right breast MLD pt has diffuclty working the lateral and under side of the breast           Patient will benefit from skilled therapeutic intervention in order to improve the following deficits and impairments:     Visit Diagnosis: Lymphedema, not elsewhere classified  Malignant neoplasm of lower-outer quadrant of right breast of female, estrogen receptor negative ([place])  Abnormal posture     Problem List Patient Active Problem List   Diagnosis Date Noted  . Genetic testing 10/13/2018  . Family history of melanoma   . Family history of prostate cancer   . Family history of cervical cancer   . Malignant neoplasm of lower-outer quadrant of right breast of female, estrogen receptor negative ([place]) 10/06/2018  . Obesity, Class II, BMI 35-39.9 02/02/2018  . Elevated LDL cholesterol level 01/05/2017  . Dermatitis 01/31/2016  . Migraine without aura 04/21/2011  . Esophageal reflux 04/21/2011  . Depression with anxiety 04/21/2011   . Major depressive disorder, single episode 04/21/2011    Stark Bray 01/20/2020, 10:56 AM  [place], [place], [address] Phone: [phone]   Fax:  [phone]  Name: [name] MRN: [id number] Date of Birth: 1975-12-17

## 2020-01-25 ENCOUNTER — Ambulatory Visit: Payer: 59

## 2020-01-25 ENCOUNTER — Other Ambulatory Visit: Payer: Self-pay

## 2020-01-25 DIAGNOSIS — I890 Lymphedema, not elsewhere classified: Secondary | ICD-10-CM | POA: Diagnosis not present

## 2020-01-25 DIAGNOSIS — C50511 Malignant neoplasm of lower-outer quadrant of right female breast: Secondary | ICD-10-CM

## 2020-01-25 DIAGNOSIS — R293 Abnormal posture: Secondary | ICD-10-CM

## 2020-01-25 DIAGNOSIS — Z171 Estrogen receptor negative status [ER-]: Secondary | ICD-10-CM

## 2020-01-25 NOTE — Therapy (Signed)
[place], [place], [address] Phone: [phone]   Fax:  [phone]  Physical Therapy Treatment  Patient Details  Name: [name] MRN: [id number] Date of Birth: 02-26-1976 Referring Provider (PT): Dr. Lindi Adie   Encounter Date: 01/25/2020   PT End of Session - 01/25/20 1001    Visit Number 5    Number of Visits 13    Date for PT Re-Evaluation 02/09/20    PT Start Time 0901    PT Stop Time 0959    PT Time Calculation (min) 58 min    Activity Tolerance Patient tolerated treatment well    Behavior During Therapy [facility] for tasks assessed/performed           Past Medical History:  Diagnosis Date  . Allergy   . Breast cancer ([place]) 08/2018   Right breast cancer  . Depression   . Family history of cervical cancer   . Family history of melanoma   . Family history of prostate cancer   . GERD (gastroesophageal reflux disease)   . Migraine   . Personal history of radiation therapy     Past Surgical History:  Procedure Laterality Date  . ADENOIDECTOMY    . APPENDECTOMY    . BREAST LUMPECTOMY    . BREAST LUMPECTOMY WITH RADIOACTIVE SEED AND SENTINEL LYMPH NODE BIOPSY Right 11/03/2018   Procedure: RIGHT BREAST LUMPECTOMY WITH RADIOACTIVE SEED AND RIGHT SENTINEL LYMPH NODE MAPPING;  Surgeon: Erroll Luna, MD;  Location: [place];  Service: General;  Laterality: Right;  . CESAREAN SECTION    . OPERATIVE ULTRASOUND Right 11/03/2018   Procedure: OPERATIVE ULTRASOUND;  Surgeon: Erroll Luna, MD;  Location: [place];  Service: General;  Laterality: Right;  . PORT-A-CATH REMOVAL N/A 12/09/2018   Procedure: PORT REMOVAL;  Surgeon: Erroll Luna, MD;  Location: [place];  Service: General;  Laterality: N/A;  . PORTACATH PLACEMENT Right 11/03/2018   Procedure: INSERTION PORT-A-CATH;  Surgeon: Erroll Luna, MD;  Location: [place];   Service: General;  Laterality: Right;    There were no vitals filed for this visit.   Subjective Assessment - 01/25/20 0902    Subjective I tried the new bra she gave me last time with the swell spot and it seemed to work well. I had the lines on my breast and I could tell it was softer. I have only been ale to wear it once though becuase I ended up having my grandson all weeekend. And I have an app[facility] [place] [place] tomorrow to get measured for compression bras.    Pertinent History Rt breast lumpectomy 11/03/18 due to triple negative breast cancer with completion of radiation with boost to seroma location.  Treatment here for breast lymphedema and recent open spot that was draining on the breast. Other health history:    Patient Stated Goals Reduce the swelling    Currently in Pain? No/denies                       [facility]  Lymphedema Life Impact Scale Total Score 39.71 %            [facility] Adult PT Treatment/Exercise - 01/25/20 0001      Manual Therapy   Soft tissue mobilization --    Manual Lymphatic Drainage (MLD) Short neck, superficial and deep abdominals, Lt axillary nodes, Lt upper quadrant  intact sequence, right inguinal nodes, anterior interaxillary anastamosis, Rt axillo inuinal anastamosis, then Right breast working towards pathways; then into Lt S/L for further work to lateral aspect of breast, redirecting towards Rt axillo-inguinal and posterior inter-axillary anasotmosis, then finished retracing steps in supine                       PT Long Term Goals - 12/29/19 1757      PT LONG TERM GOAL #1   Title Pt will report 50% improvement in symptoms in the R breast for improve quality of life.    Time 6    Period Weeks    Status New      PT LONG TERM GOAL #2   Title Pt will be ind with self MLD or use of pump to improve skin condition    Time 6    Period Weeks    Status  New      PT LONG TERM GOAL #3   Title NA      PT LONG TERM GOAL #4   Title NA                 Plan - 01/25/20 1001    Clinical Impression Statement Pt reports has worn the swell spot with the bra she was given at last session and this was very beneficial but she has only been able to wear this once so encouraged her to try to do this more frequently at night and when at home. She has appt to be measured for compression bras tomorrow at [facility] [place] and was encouraged to bring swell spot so they can get a bra to fit that as well. Pt verbalized understanding all. Good softening of lateral breast tissue noted since this therapist saw her last. Overall pt does seem to be making some progress towards being able to maintain progress.    Personal Factors and Comorbidities Comorbidity 2    Comorbidities radiation history, SLNB    Stability/Clinical Decision Making Evolving/Moderate complexity    Rehab Potential Good    PT Frequency 2x / week    PT Duration 6 weeks    PT Treatment/Interventions ADLs/Self Care Home Management;Therapeutic exercise;Patient/family education;Manual techniques;Manual lymph drainage;Compression bandaging;Passive range of motion;Taping;Vasopneumatic Device    PT Next Visit Plan how was bra appt? try swell spot more frequentty? continue right breast MLD pt has diffuclty working the lateral and under side of the breast    PT Home Exercise Plan self MLD; sleep in sports bra with swell spot    Consulted and Agree with Plan of Care Patient           Patient will benefit from skilled therapeutic intervention in order to improve the following deficits and impairments:  Decreased skin integrity, Increased edema  Visit Diagnosis: Lymphedema, not elsewhere classified  Malignant neoplasm of lower-outer quadrant of right breast of female, estrogen receptor negative ([place])  Abnormal posture     Problem List Patient Active Problem List   Diagnosis Date Noted  .  Genetic testing 10/13/2018  . Family history of melanoma   . Family history of prostate cancer   . Family history of cervical cancer   . Malignant neoplasm of lower-outer quadrant of right breast of female, estrogen receptor negative ([place]) 10/06/2018  . Obesity, Class II, BMI 35-39.9 02/02/2018  . Elevated LDL cholesterol level 01/05/2017  . Dermatitis 01/31/2016  . Migraine without aura 04/21/2011  . Esophageal reflux 04/21/2011  . Depression  with anxiety 04/21/2011  . Major depressive disorder, single episode 04/21/2011    Otelia Limes, PTA 01/25/2020, 10:04 AM  [place], [place], [address] Phone: [phone]   Fax:  [phone]  Name: [name] MRN: [id number] Date of Birth: 17-Sep-1975

## 2020-01-26 ENCOUNTER — Ambulatory Visit: Payer: 59 | Admitting: Rehabilitation

## 2020-01-30 ENCOUNTER — Ambulatory Visit: Payer: 59 | Attending: Hematology and Oncology

## 2020-01-30 ENCOUNTER — Other Ambulatory Visit: Payer: Self-pay

## 2020-01-30 DIAGNOSIS — C50511 Malignant neoplasm of lower-outer quadrant of right female breast: Secondary | ICD-10-CM | POA: Diagnosis present

## 2020-01-30 DIAGNOSIS — R293 Abnormal posture: Secondary | ICD-10-CM | POA: Diagnosis present

## 2020-01-30 DIAGNOSIS — I890 Lymphedema, not elsewhere classified: Secondary | ICD-10-CM | POA: Diagnosis present

## 2020-01-30 DIAGNOSIS — Z171 Estrogen receptor negative status [ER-]: Secondary | ICD-10-CM | POA: Insufficient documentation

## 2020-01-30 NOTE — Therapy (Signed)
[place], [place], [address] Phone: [phone]   Fax:  [phone]  Physical Therapy Treatment  Patient Details  Name: [name] MRN: [id number] Date of Birth: 26-Mar-1976 Referring Provider (PT): Dr. Lindi Adie   Encounter Date: 01/30/2020   PT End of Session - 01/30/20 1702    Visit Number 6    Number of Visits 13    Date for PT Re-Evaluation 02/09/20    PT Start Time 1558    PT Stop Time 1701    PT Time Calculation (min) 63 min    Activity Tolerance Patient tolerated treatment well    Behavior During Therapy [facility] for tasks assessed/performed           Past Medical History:  Diagnosis Date  . Allergy   . Breast cancer ([place]) 08/2018   Right breast cancer  . Depression   . Family history of cervical cancer   . Family history of melanoma   . Family history of prostate cancer   . GERD (gastroesophageal reflux disease)   . Migraine   . Personal history of radiation therapy     Past Surgical History:  Procedure Laterality Date  . ADENOIDECTOMY    . APPENDECTOMY    . BREAST LUMPECTOMY    . BREAST LUMPECTOMY WITH RADIOACTIVE SEED AND SENTINEL LYMPH NODE BIOPSY Right 11/03/2018   Procedure: RIGHT BREAST LUMPECTOMY WITH RADIOACTIVE SEED AND RIGHT SENTINEL LYMPH NODE MAPPING;  Surgeon: Erroll Luna, MD;  Location: [place];  Service: General;  Laterality: Right;  . CESAREAN SECTION    . OPERATIVE ULTRASOUND Right 11/03/2018   Procedure: OPERATIVE ULTRASOUND;  Surgeon: Erroll Luna, MD;  Location: [place];  Service: General;  Laterality: Right;  . PORT-A-CATH REMOVAL N/A 12/09/2018   Procedure: PORT REMOVAL;  Surgeon: Erroll Luna, MD;  Location: [place];  Service: General;  Laterality: N/A;  . PORTACATH PLACEMENT Right 11/03/2018   Procedure: INSERTION PORT-A-CATH;  Surgeon: Erroll Luna, MD;  Location: [place];   Service: General;  Laterality: Right;    There were no vitals filed for this visit.   Subjective Assessment - 01/30/20 1602    Subjective I did get a new compression bra at my appointment last week and I really like it. It's full coverage. And my pump just arrived today so now I'm just waiting for the rep to call me to tell me when they can come over to set it up.    Pertinent History Rt breast lumpectomy 11/03/18 due to triple negative breast cancer with completion of radiation with boost to seroma location.  Treatment here for breast lymphedema and recent open spot that was draining on the breast. Other health history:    Patient Stated Goals Reduce the swelling    Currently in Pain? No/denies                       [facility]  Lymphedema Life Impact Scale Total Score 39.71 %            [facility] Adult PT Treatment/Exercise - 01/30/20 0001      Manual Therapy   Manual Lymphatic Drainage (MLD) Short neck, superficial and deep abdominals, Lt axillary nodes, Lt upper quadrant intact sequence, right inguinal nodes, anterior interaxillary anastamosis, Rt axillo inuinal anastamosis, then Right breast working towards pathways; then into Lt S/L for further work to lateral  aspect of breast, redirecting towards Rt axillo-inguinal and posterior inter-axillary anasotmosis, then finished retracing steps in supine                       PT Long Term Goals - 12/29/19 1757      PT LONG TERM GOAL #1   Title Pt will report 50% improvement in symptoms in the R breast for improve quality of life.    Time 6    Period Weeks    Status New      PT LONG TERM GOAL #2   Title Pt will be ind with self MLD or use of pump to improve skin condition    Time 6    Period Weeks    Status New      PT LONG TERM GOAL #3   Title NA      PT LONG TERM GOAL #4   Title NA                 Plan - 01/30/20 1702     Clinical Impression Statement Pt returns to physical therapy wearing new compression bra she received after last session. It's good coverage and pt reports noticing this being more compressive than her other bras. Her compression pump arrived this morning in the mail so she is now just waiting to hear from the pump rep to schedule a time for them to come out and set it up for her. Pt will be ready for D/C once she is able to use her pump. Continued with manual lymph drainage to Rt breast focusing on superior and lateral aspects where most palpable fibrosis. This softened well by end of session.    Personal Factors and Comorbidities Comorbidity 2    Comorbidities radiation history, SLNB    Stability/Clinical Decision Making Evolving/Moderate complexity    Rehab Potential Good    PT Frequency 2x / week    PT Duration 6 weeks    PT Treatment/Interventions ADLs/Self Care Home Management;Therapeutic exercise;Patient/family education;Manual techniques;Manual lymph drainage;Compression bandaging;Passive range of motion;Taping;Vasopneumatic Device    PT Next Visit Plan D/C once pts compression pump gets set up by rep.    PT Home Exercise Plan self MLD; sleep in sports bra with swell spot    Consulted and Agree with Plan of Care Patient           Patient will benefit from skilled therapeutic intervention in order to improve the following deficits and impairments:  Decreased skin integrity, Increased edema  Visit Diagnosis: Lymphedema, not elsewhere classified  Malignant neoplasm of lower-outer quadrant of right breast of female, estrogen receptor negative ([place])  Abnormal posture     Problem List Patient Active Problem List   Diagnosis Date Noted  . Genetic testing 10/13/2018  . Family history of melanoma   . Family history of prostate cancer   . Family history of cervical cancer   . Malignant neoplasm of lower-outer quadrant of right breast of female, estrogen receptor negative ([place])  10/06/2018  . Obesity, Class II, BMI 35-39.9 02/02/2018  . Elevated LDL cholesterol level 01/05/2017  . Dermatitis 01/31/2016  . Migraine without aura 04/21/2011  . Esophageal reflux 04/21/2011  . Depression with anxiety 04/21/2011  . Major depressive disorder, single episode 04/21/2011    Otelia Limes, PTA 01/30/2020, 5:15 PM  [place], [place], [address] Phone: [phone]   Fax:  [phone]  Name: ZYAH GOMM MRN: [id number]  Date of Birth: [birth year]

## 2020-01-31 ENCOUNTER — Encounter: Payer: 59 | Admitting: Rehabilitation

## 2020-02-03 ENCOUNTER — Encounter: Payer: 59 | Admitting: Rehabilitation

## 2020-02-22 ENCOUNTER — Telehealth: Payer: Self-pay | Admitting: Hematology and Oncology

## 2020-02-22 NOTE — Telephone Encounter (Signed)
Rescheduled per provider. Called and spoke with pt, confirmed 12/16 appt

## 2020-03-08 ENCOUNTER — Ambulatory Visit: Payer: 59 | Admitting: Hematology and Oncology

## 2020-03-14 NOTE — Progress Notes (Signed)
Patient Care Team: Annye English as PCP - General (Physician Assistant) Nicholas Lose, MD as Consulting Physician (Hematology and Oncology) Kyung Rudd, MD as Consulting Physician (Radiation Oncology) Erroll Luna, MD as Consulting Physician (General Surgery)  DIAGNOSIS:    ICD-10-CM   1. Malignant neoplasm of lower-outer quadrant of right breast of female, estrogen receptor negative ([place])  C50.511    Z17.1     SUMMARY OF ONCOLOGIC HISTORY: Oncology History  Malignant neoplasm of lower-outer quadrant of right breast of female, estrogen receptor negative ([place])  09/27/2018 Cancer Staging   Staging form: Breast, AJCC 8th Edition - Clinical stage from 09/27/2018: Stage IB (cT1b, cN0, cM0, G3, ER-, PR-, HER2-) - Signed by Gardenia Phlegm, NP on 10/06/2018   10/06/2018 Initial Diagnosis   Screening mammogram detected right breast mass. Diagnostic mammogram showed 9m mass with adjacent 355mmass in the right breast with no axillary adenopathy. 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  The report date is 10/13/2018.  The Common Hereditary Gene Panel offered by [facility] includes sequencing and/or deletion duplication testing of the following 48 genes: APC, ATM, AXIN2, BARD1, BMPR1A, BRCA1, BRCA2, BRIP1, CDH1, CDK4, CDKN2A (p14ARF), CDKN2A (p16INK4a), CHEK2, CTNNA1, DICER1, EPCAM (Deletion/duplication testing only), GREM1 (promoter region deletion/duplication testing only), KIT, MEN1, MLH1, MSH2, MSH3, MSH6, MUTYH, NBN, NF1, NHTL1, PALB2, PDGFRA, PMS2, POLD1, POLE, PTEN, RAD50, RAD51C, RAD51D, RNF43, SDHB, SDHC, SDHD, SMAD4, SMARCA4. STK11, TP53, TSC1, TSC2, and VHL.  The following genes were evaluated for sequence changes only: SDHA  and HOXB13 c.251G>A variant only.    11/03/2018 Surgery   Right lumpectomy ([place]) ([id number] IDC, 0.5cm, grade 3, clear margins, 2 axillary lymph nodes negative.  Triple negative with a Ki-67 of 90%   11/10/2018 Cancer Staging   Staging form: Breast, AJCC 8th Edition - Pathologic: Stage IB (pT1a, pN0, cM0, G3, ER-, PR-, HER2-) - Signed by GuNicholas LoseMD on 11/10/2018   12/28/2018 - 02/10/2019 Radiation Therapy   The patient initially received a dose of 50.4 Gy in 28 fractions to the breast using whole-breast tangent fields. This was delivered using a 3-D conformal technique. The patient then received a boost to the seroma. This delivered an additional 10 Gy in 5 fractions using a 3-field photon boost technique. The total dose was 60.4 Gy.     CHIEF COMPLIANT: Follow-up of right breast cancer  INTERVAL HISTORY: DaNAVI EWTONs a [age].o. with above-mentioned history of triple negative right breast cancerwhounderwent a lumpectomy,radiation, and is currently on surveillance.She presents to the clinic today for follow-up.  All the problems that she had previously with regards to the open sore in the swelling of the breast have significantly resolved.  She does not have any pain or discomfort in the breast and day-to-day basis.  Occasionally there is some discomfort.  ALLERGIES:  has No Known Allergies.  MEDICATIONS:  Current Outpatient Medications  Medication Sig Dispense Refill  . Erenumab-aooe (AIMOVIG) 140 MG/ML SOAJ Inject 140 mg into the skin every 30 (thirty) days. 1.12 mg   . Melatonin 10 MG TABS Take 1 tablet by mouth at bedtime as needed.    . Marland KitchenUVARING 0.12-0.015 MG/24HR vaginal ring Place 1 each vaginally every 30 (thirty) days.    . pantoprazole (PROTONIX) 40 MG  tablet Take 1 tablet (40 mg total) by mouth daily. 90 tablet 1  . promethazine (PHENERGAN) 25 MG tablet     . rizatriptan (MAXALT) 10 MG tablet Take 10 mg by mouth as needed for migraine. May repeat in 2 hours if  needed    . sertraline (ZOLOFT) 100 MG tablet Take 1.5 tablets (150 mg total) by mouth daily. PATIENT NEEDS OFFICE VISIT FOR ADDITIONAL REFILLS 45 tablet 0  . SUMAtriptan Succinate Refill 6 MG/0.5ML SOCT Inject 6 mg into the skin as needed. 15 mL   . topiramate (TOPAMAX) 100 MG tablet Take 100 mg by mouth daily.    . traMADol (ULTRAM) 50 MG tablet tramadol 50 mg tablet  Take 1 tablet every 6 hours by oral route.    Marland Kitchen Ubrogepant (UBRELVY) 100 MG TABS Take 1 tablet by mouth as needed.     No current facility-administered medications for this visit.    PHYSICAL EXAMINATION: ECOG PERFORMANCE STATUS: 1 - Symptomatic but completely ambulatory  Vitals:   03/15/20 1000  BP: (!) 142/88  Pulse: 84  Resp: 18  Temp: 97.9 F (36.6 C)  SpO2: 99%   Filed Weights   03/15/20 1000  Weight: 229 lb 8 oz (104.1 kg)    BREAST: No palpable masses or nodules in either right or left breasts. No palpable axillary supraclavicular or infraclavicular adenopathy no breast tenderness or nipple discharge. (exam performed in the presence of a chaperone)  LABORATORY DATA:  I have reviewed the data as listed CMP Latest Ref Rng & Units 09/08/2019 11/01/2018  Glucose 70 - 99 mg/dL 98 106(H)  BUN 6 - 20 mg/dL 10 10  Creatinine 0.44 - 1.00 mg/dL 0.98 0.97  Sodium 135 - 145 mmol/L 139 139  Potassium 3.5 - 5.1 mmol/L 4.2 3.7  Chloride 98 - 111 mmol/L 110 109  CO2 22 - 32 mmol/L 19(L) 20(L)  Calcium 8.9 - 10.3 mg/dL 9.3 9.0  Total Protein 6.5 - 8.1 g/dL 7.1 6.7  Total Bilirubin 0.3 - 1.2 mg/dL 0.2(L) 0.2(L)  Alkaline Phos 38 - 126 U/L 94 86  AST 15 - 41 U/L 16 17  ALT 0 - 44 U/L 19 15    Lab Results  Component Value Date   WBC 7.7 11/01/2018   HGB 15.5 (H) 11/01/2018   HCT 47.4 (H) 11/01/2018   MCV 87.5 11/01/2018   PLT 248 11/01/2018   NEUTROABS 4.0 11/01/2018    ASSESSMENT & PLAN:  Malignant neoplasm of lower-outer quadrant of right breast of female, estrogen receptor negative  ([place]) 10/06/2018:Screening mammogram detected right breast mass. Diagnostic mammogram showed 19m mass with adjacent 35mmass in the right breast with no axillary adenopathy. 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  Recommendations: 1. 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 Mammogram: 09/07/2019: Benign, density cat B 2. Breast Exam: 03/15/2020 Benign  Back pain and abdominal discomfort: CT CAP: No evidence of metastatic disease. Right breast swelling with an open sore: Resolved  Return to clinic in1 year for follow-up   No orders of the defined types were placed in this encounter.  The patient has a good understanding of the overall plan. she agrees with it. she will call with any problems that may develop before the next visit here.  Total time spent: 20 mins including face to face time and time spent for planning, charting and coordination of care  Nicholas Lose, MD 03/15/2020  I, Cloyde Reams Dorshimer, am acting as scribe for Dr. Nicholas Lose.  I have reviewed the above documentation for accuracy and completeness, and I agree with the above.

## 2020-03-14 NOTE — Assessment & Plan Note (Signed)
10/06/2018:Screening mammogram detected right breast mass. Diagnostic mammogram showed 71m mass with adjacent 329mmass in the right breast with no axillary adenopathy. 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  Recommendations: 1. 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 Mammogram: 09/07/2019: Benign, density cat B 2. Breast Exam: : Benign  Back pain and abdominal discomfort: CT CAP: No evidence of metastatic disease. Right breast swelling with an open sore: It is very likely related to lymphedema.  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 The leakage is finally subsided.  Return to clinic in1 year for follow-up

## 2020-03-15 ENCOUNTER — Inpatient Hospital Stay: Payer: 59 | Attending: Hematology and Oncology | Admitting: Hematology and Oncology

## 2020-03-15 ENCOUNTER — Other Ambulatory Visit: Payer: Self-pay

## 2020-03-15 DIAGNOSIS — Z79899 Other long term (current) drug therapy: Secondary | ICD-10-CM | POA: Insufficient documentation

## 2020-03-15 DIAGNOSIS — Z793 Long term (current) use of hormonal contraceptives: Secondary | ICD-10-CM | POA: Insufficient documentation

## 2020-03-15 DIAGNOSIS — Z923 Personal history of irradiation: Secondary | ICD-10-CM | POA: Diagnosis not present

## 2020-03-15 DIAGNOSIS — Z853 Personal history of malignant neoplasm of breast: Secondary | ICD-10-CM | POA: Insufficient documentation

## 2020-03-15 DIAGNOSIS — C50511 Malignant neoplasm of lower-outer quadrant of right female breast: Secondary | ICD-10-CM

## 2020-03-15 DIAGNOSIS — Z171 Estrogen receptor negative status [ER-]: Secondary | ICD-10-CM | POA: Diagnosis not present

## 2020-03-20 ENCOUNTER — Telehealth: Payer: Self-pay | Admitting: Hematology and Oncology

## 2020-03-20 NOTE — Telephone Encounter (Signed)
scheduled per 12/16 los. Called pt and left a msg

## 2020-07-24 ENCOUNTER — Other Ambulatory Visit: Payer: Self-pay | Admitting: Adult Health

## 2020-07-24 DIAGNOSIS — Z853 Personal history of malignant neoplasm of breast: Secondary | ICD-10-CM

## 2020-09-10 ENCOUNTER — Other Ambulatory Visit: Payer: Self-pay

## 2020-09-10 ENCOUNTER — Ambulatory Visit
Admission: RE | Admit: 2020-09-10 | Discharge: 2020-09-10 | Disposition: A | Discharge status: Home / Self Care | Payer: 59 | Source: Ambulatory Visit | Attending: Adult Health | Admitting: Adult Health

## 2020-09-10 DIAGNOSIS — Z853 Personal history of malignant neoplasm of breast: Secondary | ICD-10-CM

## 2020-10-30 ENCOUNTER — Emergency Department (HOSPITAL_BASED_OUTPATIENT_CLINIC_OR_DEPARTMENT_OTHER): Payer: 59

## 2020-10-30 ENCOUNTER — Other Ambulatory Visit: Payer: Self-pay

## 2020-10-30 ENCOUNTER — Emergency Department (HOSPITAL_BASED_OUTPATIENT_CLINIC_OR_DEPARTMENT_OTHER)
Admission: EM | Admit: 2020-10-30 | Discharge: 2020-10-30 | Disposition: A | Discharge status: Home / Self Care | Payer: 59 | Attending: Emergency Medicine | Admitting: Emergency Medicine

## 2020-10-30 ENCOUNTER — Encounter (HOSPITAL_BASED_OUTPATIENT_CLINIC_OR_DEPARTMENT_OTHER): Payer: Self-pay | Admitting: *Deleted

## 2020-10-30 DIAGNOSIS — G43909 Migraine, unspecified, not intractable, without status migrainosus: Secondary | ICD-10-CM | POA: Insufficient documentation

## 2020-10-30 DIAGNOSIS — I159 Secondary hypertension, unspecified: Secondary | ICD-10-CM | POA: Diagnosis not present

## 2020-10-30 DIAGNOSIS — Z853 Personal history of malignant neoplasm of breast: Secondary | ICD-10-CM | POA: Diagnosis not present

## 2020-10-30 DIAGNOSIS — I10 Essential (primary) hypertension: Secondary | ICD-10-CM | POA: Diagnosis present

## 2020-10-30 DIAGNOSIS — R202 Paresthesia of skin: Secondary | ICD-10-CM | POA: Diagnosis not present

## 2020-10-30 LAB — BASIC METABOLIC PANEL
Anion gap: 10 (ref 5–15)
BUN: 12 mg/dL (ref 6–20)
CO2: 19 mmol/L — ABNORMAL LOW (ref 22–32)
Calcium: 8.1 mg/dL — ABNORMAL LOW (ref 8.9–10.3)
Chloride: 110 mmol/L (ref 98–111)
Creatinine, Ser: 0.84 mg/dL (ref 0.44–1.00)
GFR, Estimated: >60 mL/min (ref >60)
Glucose, Bld: 114 mg/dL — ABNORMAL HIGH (ref 70–99)
Potassium: 3.6 mmol/L (ref 3.5–5.1)
Sodium: 139 mmol/L (ref 135–145)

## 2020-10-30 LAB — CBC WITH DIFFERENTIAL/PLATELET
Abs Immature Granulocytes: 0.02 10*3/uL (ref 0.00–0.07)
Basophils Absolute: 0.1 10*3/uL (ref 0.0–0.1)
Basophils Relative: 1 %
Eosinophils Absolute: 0.1 10*3/uL (ref 0.0–0.5)
Eosinophils Relative: 2 %
HCT: 41.7 % (ref 36.0–46.0)
Hemoglobin: 13.8 g/dL (ref 12.0–15.0)
Immature Granulocytes: 0 %
Lymphocytes Relative: 26 %
Lymphs Abs: 2 10*3/uL (ref 0.7–4.0)
MCH: 28.7 pg (ref 26.0–34.0)
MCHC: 33.1 g/dL (ref 30.0–36.0)
MCV: 86.7 fL (ref 80.0–100.0)
Monocytes Absolute: 0.6 10*3/uL (ref 0.1–1.0)
Monocytes Relative: 7 %
Neutro Abs: 4.9 10*3/uL (ref 1.7–7.7)
Neutrophils Relative %: 64 %
Platelets: 235 10*3/uL (ref 150–400)
RBC: 4.81 MIL/uL (ref 3.87–5.11)
RDW: 13.3 % (ref 11.5–15.5)
WBC: 7.7 10*3/uL (ref 4.0–10.5)
nRBC: 0 % (ref 0.0–0.2)

## 2020-10-30 MED ORDER — ACETAMINOPHEN 325 MG PO TABS
650.0000 mg | ORAL_TABLET | Freq: Once | ORAL | Status: AC
  Administered 2020-10-30: 650 mg via ORAL
  Filled 2020-10-30: qty 2

## 2020-10-30 NOTE — ED Triage Notes (Signed)
Onset of HTN and left facial numbness at work today. Went to [employer] in then sent here.

## 2020-10-30 NOTE — ED Provider Notes (Signed)
[facility] EMERGENCY DEPT Provider Note   CSN: [id number] Arrival date & time: 10/30/20  1827     History Chief Complaint  Patient presents with   Hypertension    [name] is a 45 y.o. female.  States that she noticed some high blood pressure today while at work.  Having some paresthesias to the left side of her face but denies any numbness, weakness, speech change, vision changes.  No history of high blood pressure.  Does have migraine headaches at times.  Does not have a current headache but did have some headaches over the weekend.  The history is provided by the patient.  Hypertension This is a new problem. The problem has not changed since onset.Pertinent negatives include no chest pain, no abdominal pain, no headaches and no shortness of breath. Nothing aggravates the symptoms. Nothing relieves the symptoms. She has tried nothing for the symptoms. The treatment provided no relief.      Past Medical History:  Diagnosis Date   Allergy    Breast cancer ([place]) 08/2018   Right breast cancer   Depression    Family history of cervical cancer    Family history of melanoma    Family history of prostate cancer    GERD (gastroesophageal reflux disease)    Migraine    Personal history of radiation therapy     Patient Active Problem List   Diagnosis Date Noted   Genetic testing 10/13/2018   Family history of melanoma    Family history of prostate cancer    Family history of cervical cancer    Malignant neoplasm of lower-outer quadrant of right breast of female, estrogen receptor negative ([place]) 10/06/2018   Obesity, Class II, BMI 35-39.9 02/02/2018   Elevated LDL cholesterol level 01/05/2017   Dermatitis 01/31/2016   Migraine without aura 04/21/2011   Esophageal reflux 04/21/2011   Depression with anxiety 04/21/2011   Major depressive disorder, single episode 04/21/2011    Past Surgical History:  Procedure Laterality Date   ADENOIDECTOMY      APPENDECTOMY     BREAST LUMPECTOMY     BREAST LUMPECTOMY WITH RADIOACTIVE SEED AND SENTINEL LYMPH NODE BIOPSY Right 11/03/2018   Procedure: RIGHT BREAST LUMPECTOMY WITH RADIOACTIVE SEED AND RIGHT SENTINEL LYMPH NODE MAPPING;  Surgeon: Erroll Luna, MD;  Location: [facility];  Service: General;  Laterality: Right;   CESAREAN SECTION     OPERATIVE ULTRASOUND Right 11/03/2018   Procedure: OPERATIVE ULTRASOUND;  Surgeon: Erroll Luna, MD;  Location: [facility];  Service: General;  Laterality: Right;   PORT-A-CATH REMOVAL N/A 12/09/2018   Procedure: PORT REMOVAL;  Surgeon: Erroll Luna, MD;  Location: [facility];  Service: General;  Laterality: N/A;   PORTACATH PLACEMENT Right 11/03/2018   Procedure: INSERTION PORT-A-CATH;  Surgeon: Erroll Luna, MD;  Location: [facility];  Service: General;  Laterality: Right;     OB History   No obstetric history on file.     Family History  Problem Relation Age of Onset   Hypertension Father    Hypertension Mother    Heart failure Maternal Grandmother    Emphysema Maternal Grandmother    Diabetes Maternal Grandfather    Parkinson's disease Paternal Grandmother    Heart disease Paternal Grandfather    Prostate cancer Paternal Grandfather 51       not metastatic   Melanoma Maternal Aunt 58       sun exposure   Cervical cancer Maternal Great-grandmother  Breast cancer Neg Hx     Social History   Tobacco Use   Smoking status: Never   Smokeless tobacco: Never  Vaping Use   Vaping Use: Never used  Substance Use Topics   Alcohol use: Yes    Comment: occasional   Drug use: No    Home Medications Prior to Admission medications   Medication Sig Start Date End Date Taking? Authorizing Provider  Erenumab-aooe (AIMOVIG) 140 MG/ML SOAJ Inject 140 mg into the skin every 30 (thirty) days. 10/11/18   Nicholas Lose, MD  Melatonin 10 MG TABS Take 1 tablet by mouth at bedtime as needed.  02/07/19   Nicholas Lose, MD  NUVARING 0.12-0.015 MG/24HR vaginal ring Place 1 each vaginally every 30 (thirty) days. 06/10/12   [provider]  pantoprazole (PROTONIX) 40 MG tablet Take 1 tablet (40 mg total) by mouth daily. 07/13/12   Barton Fanny, MD  promethazine (PHENERGAN) 25 MG tablet  12/29/18   [provider]  rizatriptan (MAXALT) 10 MG tablet Take 10 mg by mouth as needed for migraine. May repeat in 2 hours if needed    [provider]  sertraline (ZOLOFT) 100 MG tablet Take 1.5 tablets (150 mg total) by mouth daily. PATIENT NEEDS OFFICE VISIT FOR ADDITIONAL REFILLS 05/09/14   Mancel Bale, PA-C  SUMAtriptan Succinate Refill 6 MG/0.5ML SOCT Inject 6 mg into the skin as needed. 10/11/18   Nicholas Lose, MD  topiramate (TOPAMAX) 100 MG tablet Take 100 mg by mouth daily.    [provider]  traMADol (ULTRAM) 50 MG tablet tramadol 50 mg tablet  Take 1 tablet every 6 hours by oral route.    [provider]  Ubrogepant (UBRELVY) 100 MG TABS Take 1 tablet by mouth as needed. 09/08/19   Nicholas Lose, MD    Allergies    Patient has no known allergies.  Review of Systems   Review of Systems  Constitutional:  Negative for chills and fever.  HENT:  Negative for ear pain and sore throat.   Eyes:  Negative for pain and visual disturbance.  Respiratory:  Negative for cough and shortness of breath.   Cardiovascular:  Negative for chest pain and palpitations.  Gastrointestinal:  Negative for abdominal pain and vomiting.  Genitourinary:  Negative for dysuria and hematuria.  Musculoskeletal:  Negative for arthralgias and back pain.  Skin:  Negative for color change and rash.  Neurological:  Negative for dizziness, tremors, seizures, syncope, facial asymmetry, speech difficulty, weakness, light-headedness, numbness and headaches.  All other systems reviewed and are negative.  Physical Exam Updated Vital Signs BP 135/79   Pulse 81   Temp 98.1  F (36.7 C) (Oral)   Resp (!) 21   Ht '5\' 2"'$  (1.575 m)   Wt 110.7 kg   SpO2 98%   BMI 44.63 kg/m   Physical Exam Vitals and nursing note reviewed.  Constitutional:      General: She is not in acute distress.    Appearance: She is well-developed.  HENT:     Head: Normocephalic and atraumatic.     Nose: Nose normal.     Mouth/Throat:     Mouth: Mucous membranes are moist.  Eyes:     Extraocular Movements: Extraocular movements intact.     Conjunctiva/sclera: Conjunctivae normal.     Pupils: Pupils are equal, round, and reactive to light.  Cardiovascular:     Rate and Rhythm: Normal rate and regular rhythm.     Pulses: Normal  pulses.     Heart sounds: Normal heart sounds. No murmur heard. Pulmonary:     Effort: Pulmonary effort is normal. No respiratory distress.     Breath sounds: Normal breath sounds.  Abdominal:     Palpations: Abdomen is soft.     Tenderness: There is no abdominal tenderness.  Musculoskeletal:        General: Normal range of motion.     Cervical back: Normal range of motion and neck supple.  Skin:    General: Skin is warm and dry.  Neurological:     General: No focal deficit present.     Mental Status: She is alert and oriented to person, place, and time.     Cranial Nerves: No cranial nerve deficit.     Sensory: No sensory deficit.     Motor: No weakness.     Coordination: Coordination normal.     Gait: Gait normal.     Comments: 5+ out of 5 strength throughout, normal sensation, no drift, normal finger-to-nose finger, normal speech, normal gait, no visual field deficit    ED Results / Procedures / Treatments   Labs (all labs ordered are listed, but only abnormal results are displayed) Labs Reviewed  BASIC METABOLIC PANEL - Abnormal; Notable for the following components:      Result Value   CO2 19 (*)    Glucose, Bld 114 (*)    Calcium 8.1 (*)    All other components within normal limits  CBC WITH DIFFERENTIAL/PLATELET    EKG EKG  Interpretation  Date/Time:  Tuesday October 30 2020 18:59:17 EDT Ventricular Rate:  103 PR Interval:  159 QRS Duration: 101 QT Interval:  351 QTC Calculation: 460 R Axis:   31 Text Interpretation: Sinus tachycardia Low voltage, precordial leads Probable anteroseptal infarct, old Confirmed by Ronnald Nian, Jiovanni Heeter [phone]) on 10/30/2020 7:16:19 PM  Radiology CT HEAD WO CONTRAST (5MM)  Result Date: 10/30/2020 CLINICAL DATA:  Hypertension and left facial numbness today. EXAM: CT HEAD WITHOUT CONTRAST TECHNIQUE: Contiguous axial images were obtained from the base of the skull through the vertex without intravenous contrast. COMPARISON:  None. FINDINGS: Brain: The brain shows a normal appearance without evidence of malformation, atrophy, old or acute small or large vessel infarction, mass lesion, hemorrhage, hydrocephalus or extra-axial collection. Vascular: No hyperdense vessel. No evidence of atherosclerotic calcification. Skull: Normal.  No traumatic finding.  No focal bone lesion. Sinuses/Orbits: Sinuses are clear. Orbits appear normal. Mastoids are clear. Other: None significant IMPRESSION: Normal head CT. Electronically Signed   By: Nelson Chimes M.D.   On: 10/30/2020 19:22    Procedures Procedures   Medications Ordered in ED Medications  acetaminophen (TYLENOL) tablet 650 mg (650 mg Oral Given 10/30/20 1856)    ED Course  I have reviewed the triage vital signs and the nursing notes.  Pertinent labs & imaging results that were available during my care of the patient were reviewed by me and considered in my medical decision making (see chart for details).    MDM Rules/Calculators/A&P                           Niralya ANDERSON DANNUNZIO is here for high blood pressure.  History of migraines, anxiety, breast cancer.  Patient had blood pressure that was elevated in the 200s at urgent care and sent here for evaluation.  Blood pressure upon arrival here is 151/94.  She denies any headaches or chest pain or  shortness  of breath.  She states that she has some migraine headaches over the weekend.  She describes some paresthesias over the left side of her face but her sensation is intact.  She has normal strength.  Overall neuro exam is normal.  Have low suspicion for stroke.  She still feels paresthesia but when I touch both sides of her face they feel the same.  This could be an aura to a start of a migraine.  Could be anxiety.  Blood pressure has improved greatly.  We will give her Tylenol and check basic labs and do a head CT for further evaluation.  CT scan unremarkable.  No significant anemia, electrolyte abnormality.  Blood pressure 135/79 at time of discharge.  Overall have low suspicion for chronic hypertension or TIA or stroke.  Suspect this is more migraine related.  Recommend that she take her migraine medications at home when she develops a more severe headache.  Overall neurologically intact and discharged in ED in good condition.  Recommend follow-up with primary care doctor and recommend keeping high blood pressure journal.  This chart was dictated using voice recognition software.  Despite best efforts to proofread,  errors can occur which can change the documentation meaning.   Final Clinical Impression(s) / ED Diagnoses Final diagnoses:  Paresthesia  Secondary hypertension    Rx / DC Orders ED Discharge Orders     None        Lennice Sites, DO 10/30/20 2026

## 2021-01-19 IMAGING — MG DIGITAL DIAGNOSTIC BILAT W/ TOMO W/ CAD
6 of 10 series · 6 of 26 positions shown · non-contrast
Comparison: Previous exam(s).

CLINICAL DATA: 44-year-old female status post malignant right
lumpectomy with radiation therapy in 9393. This is the patient's
first post lumpectomy mammogram. No current issues.

EXAM:
DIGITAL DIAGNOSTIC BILATERAL MAMMOGRAM WITH CAD AND TOMO

[R MLO]
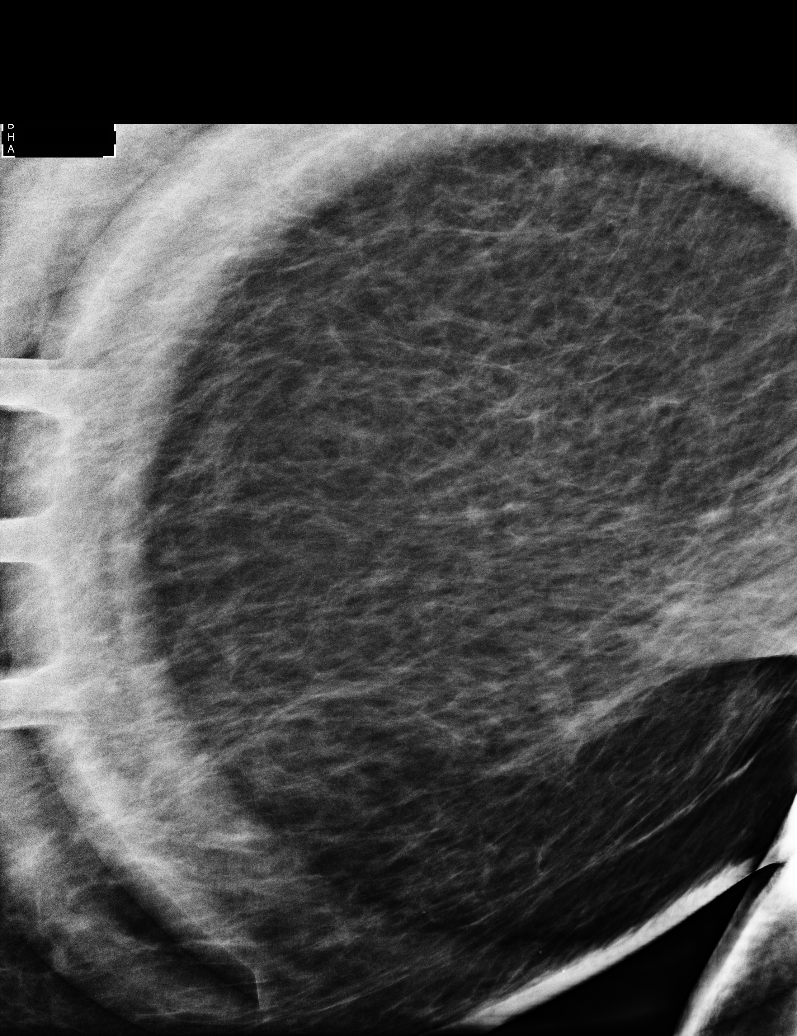

[R CC]
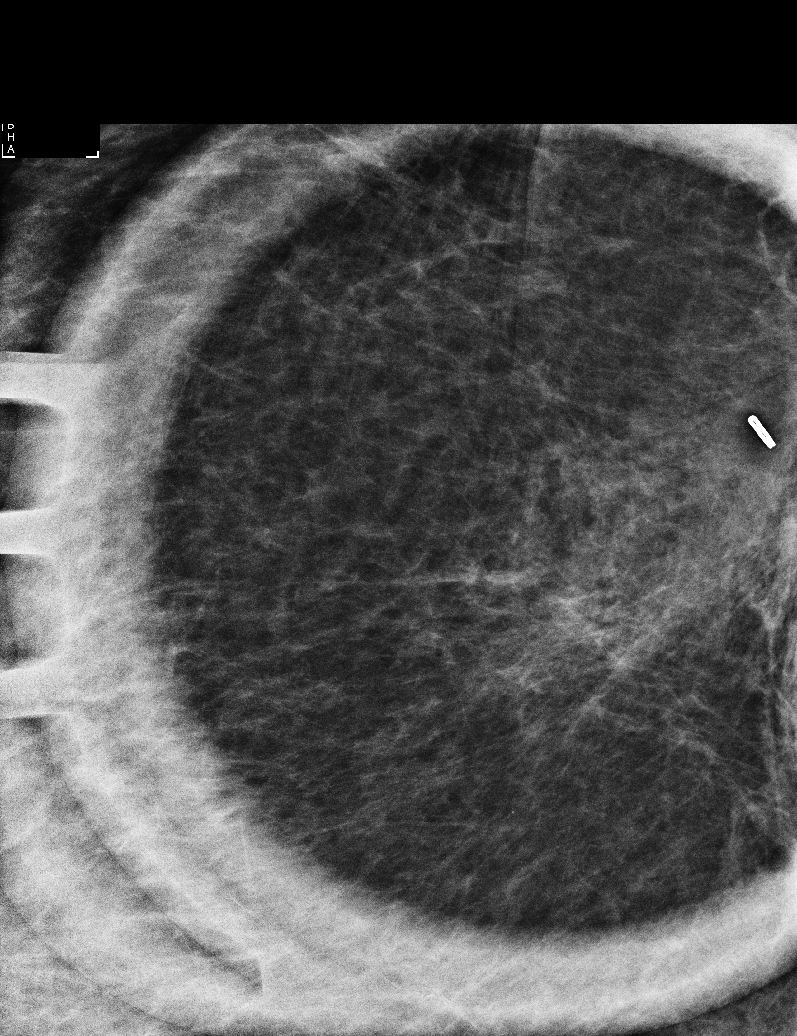

[R CC synth-2D]
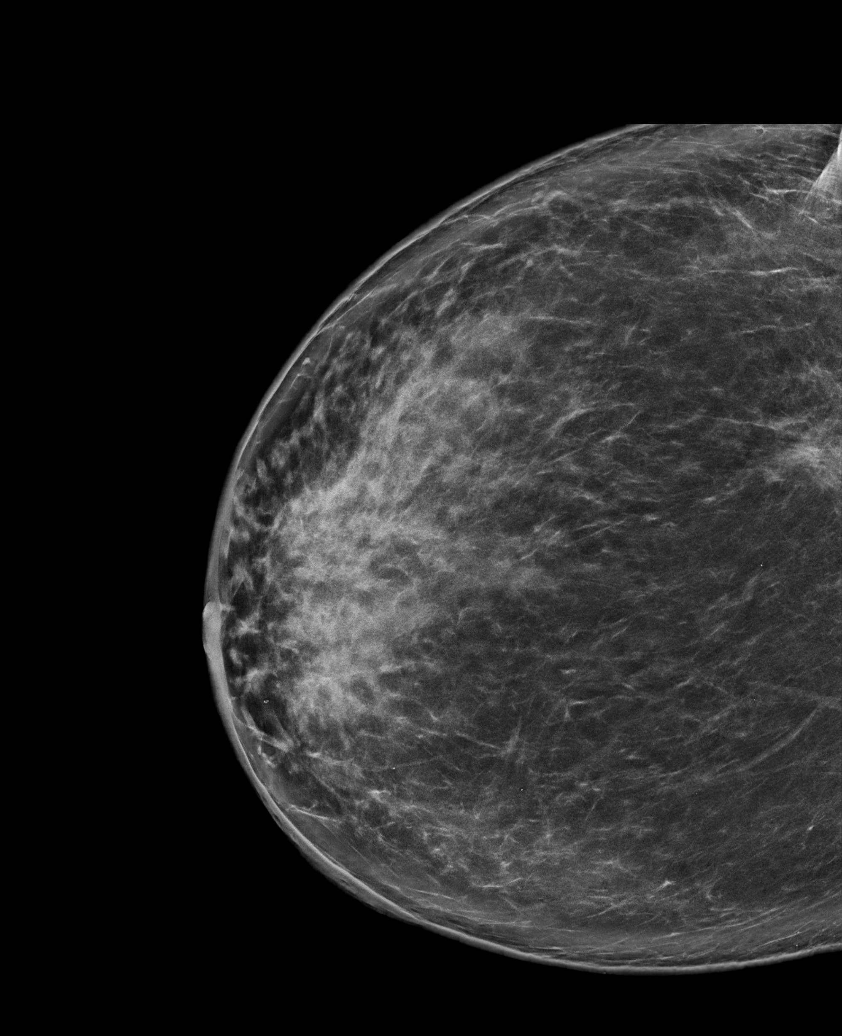

[L MLO synth-2D]
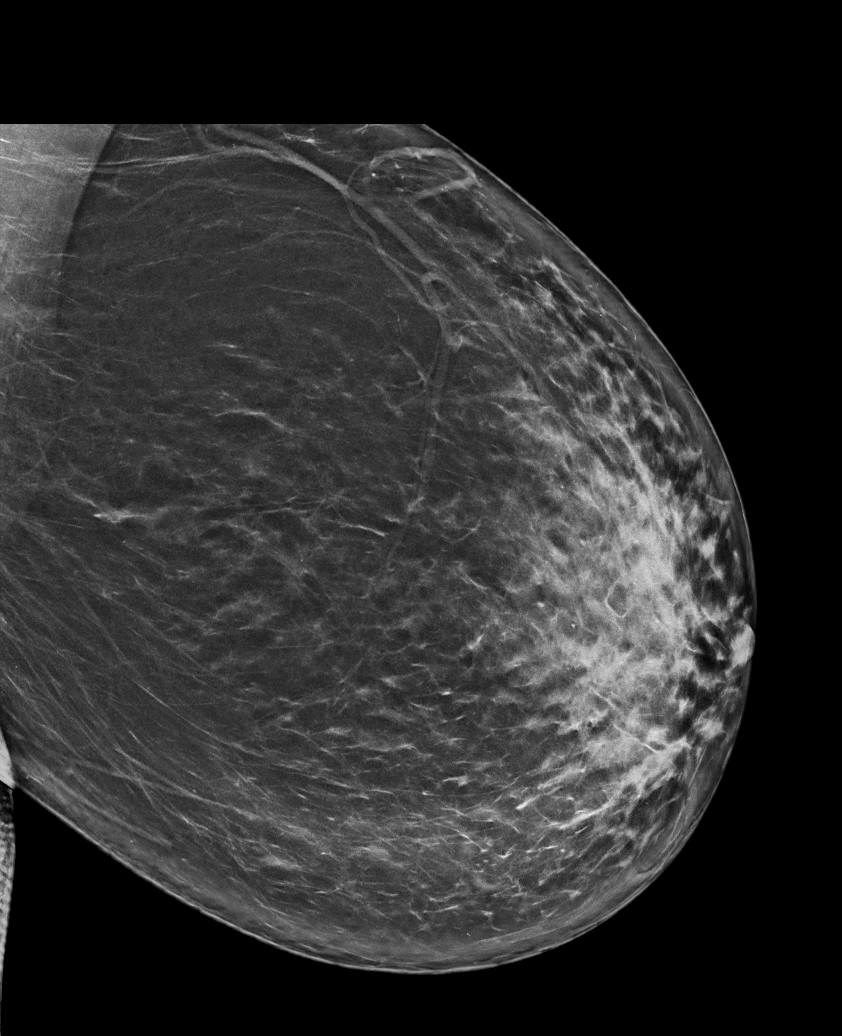

[R MLO synth-2D]
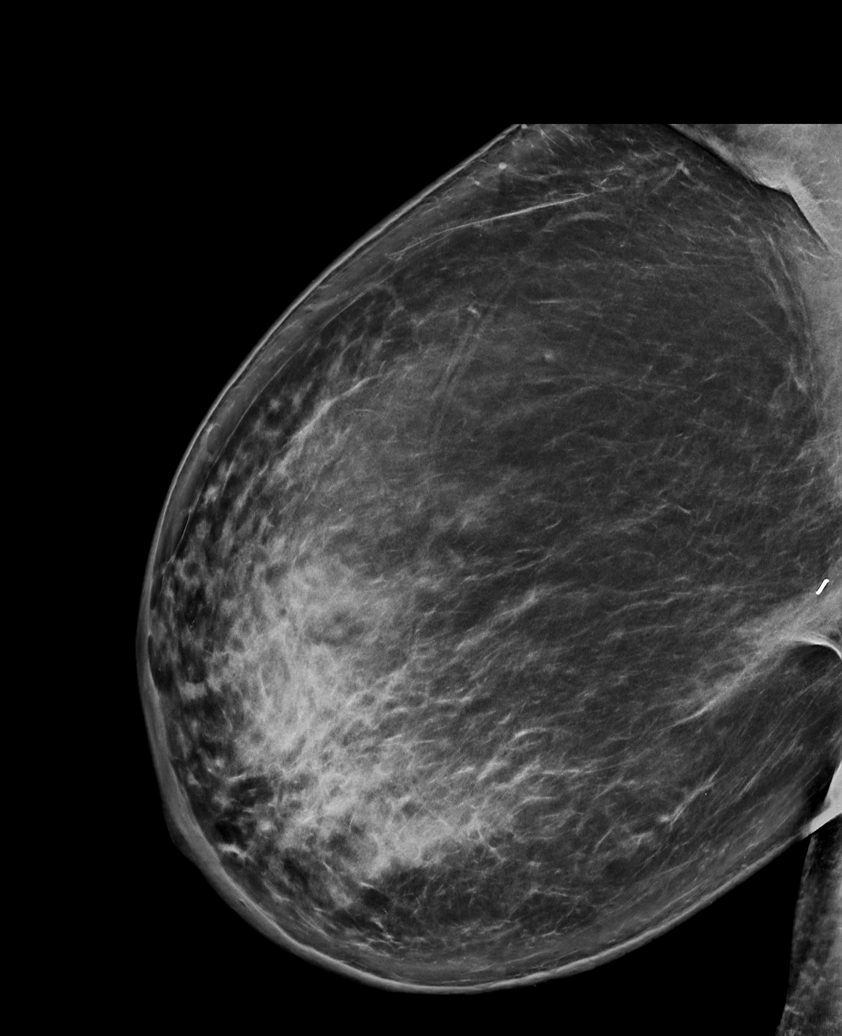

[L CC synth-2D]
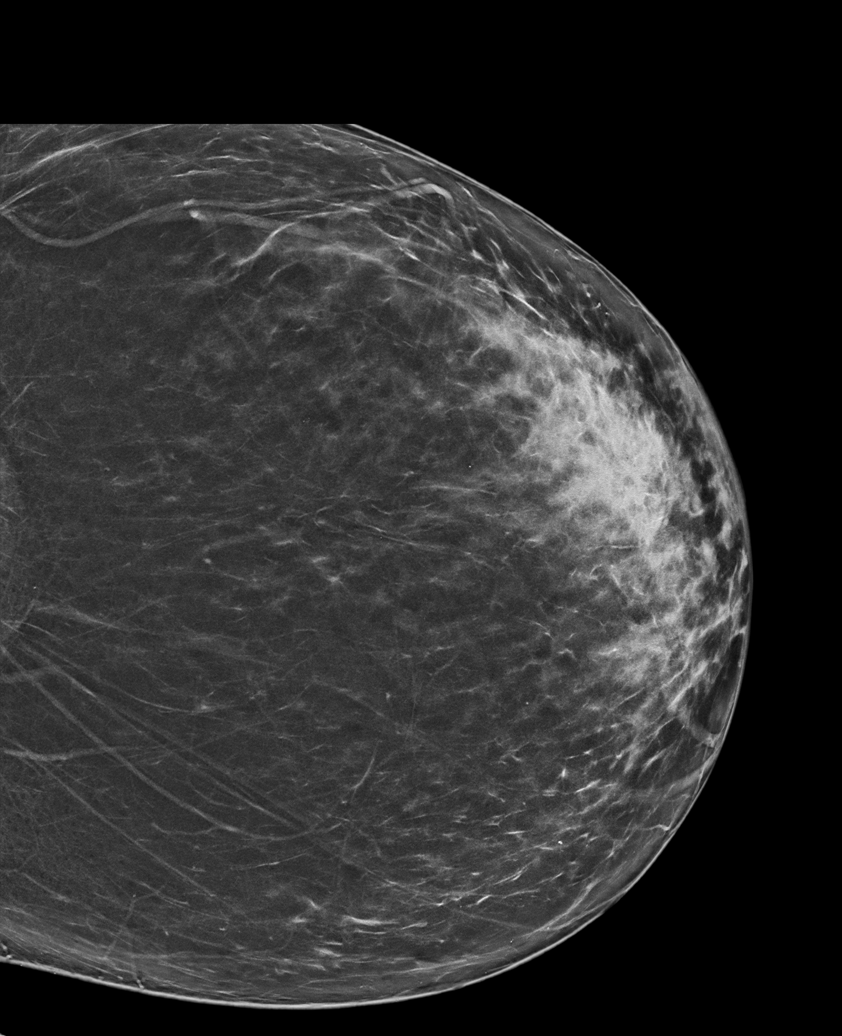

[6 of 26 positions shown; findings below may reference images not displayed]

ACR Breast Density Category b: There are scattered areas of
fibroglandular density.
FINDINGS: Interval post lumpectomy changes are noted in the far posterior
right breast. Diffuse right-sided skin and trabecular thickening is
consistent with radiation related changes. No suspicious findings
are identified in either breast.

Mammographic images were processed with CAD.
IMPRESSION: 1. No mammographic evidence of malignancy in either breast.
2. Interval right breast posttreatment changes.

RECOMMENDATION:
Diagnostic mammogram is suggested in 1 year. (Code:[id number])

I have discussed the findings and recommendations with the patient.
If applicable, a reminder letter will be sent to the patient
regarding the next appointment.

BI-RADS CATEGORY  2: Benign.

## 2021-03-14 NOTE — Assessment & Plan Note (Signed)
10/06/2018:Screening mammogram detected right breast mass. Diagnostic mammogram showed 8mm mass with adjacent 3mm mass in the right breast with no axillary adenopathy. 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  °  °Recommendations: °1. Breast conserving surgery 11/03/2018: IDC 0.5 cm, grade 3, 0/2 lymph nodes negative, triple negative, Ki-67 90% °2. 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 °---------------------------------------------------------------------------------------------------------------------------------------- °Breast Cancer Surveillance: °1. Mammogram: 09/10/2020: Benign, density cat B °2. Breast Exam: 03/15/2021 Benign °  °Back pain and abdominal discomfort: CT CAP: No evidence of metastatic disease. °Right breast swelling with an open sore: Resolved °  °Return to clinic in 1 year for follow-up °

## 2021-03-14 NOTE — Progress Notes (Signed)
Patient Care Team: Annye English as PCP - General (Physician Assistant) Nicholas Lose, MD as Consulting Physician (Hematology and Oncology) Kyung Rudd, MD as Consulting Physician (Radiation Oncology) Erroll Luna, MD as Consulting Physician (General Surgery)  DIAGNOSIS:    ICD-10-CM   1. Malignant neoplasm of lower-outer quadrant of right breast of female, estrogen receptor negative ([place])  C50.511    Z17.1       SUMMARY OF ONCOLOGIC HISTORY: Oncology History  Malignant neoplasm of lower-outer quadrant of right breast of female, estrogen receptor negative ([place])  09/27/2018 Cancer Staging   Staging form: Breast, AJCC 8th Edition - Clinical stage from 09/27/2018: Stage IB (cT1b, cN0, cM0, G3, ER-, PR-, HER2-) - Signed by Gardenia Phlegm, NP on 10/06/2018    10/06/2018 Initial Diagnosis   Screening mammogram detected right breast mass. Diagnostic mammogram showed 14m mass with adjacent 358mmass in the right breast with no axillary adenopathy. 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  The report date is 10/13/2018.  The Common Hereditary Gene Panel offered by [facility] includes sequencing and/or deletion duplication testing of the following 48 genes: APC, ATM, AXIN2, BARD1, BMPR1A, BRCA1, BRCA2, BRIP1, CDH1, CDK4, CDKN2A (p14ARF), CDKN2A (p16INK4a), CHEK2, CTNNA1, DICER1, EPCAM (Deletion/duplication testing only), GREM1 (promoter region deletion/duplication testing only), KIT, MEN1, MLH1, MSH2, MSH3, MSH6, MUTYH, NBN, NF1, NHTL1, PALB2, PDGFRA, PMS2, POLD1, POLE, PTEN, RAD50, RAD51C, RAD51D, RNF43, SDHB, SDHC, SDHD, SMAD4, SMARCA4. STK11, TP53, TSC1, TSC2, and VHL.  The following genes were evaluated for sequence changes only:  SDHA and HOXB13 c.251G>A variant only.    11/03/2018 Surgery   Right lumpectomy ([facility]) ([id number] IDC, 0.5cm, grade 3, clear margins, 2 axillary lymph nodes negative.  Triple negative with a Ki-67 of 90%   11/10/2018 Cancer Staging   Staging form: Breast, AJCC 8th Edition - Pathologic: Stage IB (pT1a, pN0, cM0, G3, ER-, PR-, HER2-) - Signed by GuNicholas LoseMD on 11/10/2018    12/28/2018 - 02/10/2019 Radiation Therapy   The patient initially received a dose of 50.4 Gy in 28 fractions to the breast using whole-breast tangent fields. This was delivered using a 3-D conformal technique. The patient then received a boost to the seroma. This delivered an additional 10 Gy in 5 fractions using a 3-field photon boost technique. The total dose was 60.4 Gy.     CHIEF COMPLIANT: Follow-up of right breast cancer  INTERVAL HISTORY: [name] a [age].o. with above-mentioned history of triple negative right breast cancer who underwent a lumpectomy, radiation, and is currently on surveillance. Mammogram on 09/10/2020 showed no evidence of malignancy. She presents to the clinic today for follow-up.  Her major complaint today is frequent upper respiratory infections as well as gastrointestinal infections. She denies any lumps or nodules in the breast.  ALLERGIES:  has No Known Allergies.  MEDICATIONS:  Current Outpatient Medications  Medication Sig Dispense Refill   Erenumab-aooe (AIMOVIG) 140 MG/ML SOAJ Inject 140 mg into the skin every 30 (thirty) days. 1.12 mg    Melatonin 10 MG TABS Take 1 tablet by mouth at bedtime as needed.     NUVARING 0.12-0.015 MG/24HR vaginal ring Place 1 each vaginally every 30 (thirty) days.     pantoprazole (PROTONIX) 40 MG tablet Take 1 tablet (  40 mg total) by mouth daily. 90 tablet 1   promethazine (PHENERGAN) 25 MG tablet      rizatriptan (MAXALT) 10 MG tablet Take 10 mg by mouth as needed for migraine. May repeat in 2 hours if needed     sertraline (ZOLOFT)  100 MG tablet Take 1.5 tablets (150 mg total) by mouth daily. PATIENT NEEDS OFFICE VISIT FOR ADDITIONAL REFILLS 45 tablet 0   SUMAtriptan Succinate Refill 6 MG/0.5ML SOCT Inject 6 mg into the skin as needed. 15 mL    topiramate (TOPAMAX) 100 MG tablet Take 100 mg by mouth daily.     traMADol (ULTRAM) 50 MG tablet tramadol 50 mg tablet  Take 1 tablet every 6 hours by oral route.     Ubrogepant (UBRELVY) 100 MG TABS Take 1 tablet by mouth as needed.     No current facility-administered medications for this visit.    PHYSICAL EXAMINATION: ECOG PERFORMANCE STATUS: 1 - Symptomatic but completely ambulatory  Vitals:   03/15/21 0947  BP: (!) 156/94  Pulse: 95  Resp: 18  Temp: 97.9 F (36.6 C)  SpO2: 96%   Filed Weights   03/15/21 0947  Weight: 239 lb 9.6 oz (108.7 kg)    BREAST: No palpable masses or nodules in either right or left breasts. No palpable axillary supraclavicular or infraclavicular adenopathy no breast tenderness or nipple discharge. (exam performed in the presence of a chaperone)  LABORATORY DATA:  I have reviewed the data as listed CMP Latest Ref Rng & Units 10/30/2020 09/08/2019 11/01/2018  Glucose 70 - 99 mg/dL 114(H) 98 106(H)  BUN 6 - 20 mg/dL _0 Creatinine 0.44 - 1.00 mg/dL 0.84 0.98 0.97  Sodium 135 - 145 mmol/L 139 139 139  Potassium 3.5 - 5.1 mmol/L 3.6 4.2 3.7  Chloride 98 - 111 mmol/L 110 110 109  CO2 22 - 32 mmol/L 19(L) 19(L) 20(L)  Calcium 8.9 - 10.3 mg/dL 8.1(L) 9.3 9.0  Total Protein 6.5 - 8.1 g/dL - 7.1 6.7  Total Bilirubin 0.3 - 1.2 mg/dL - 0.2(L) 0.2(L)  Alkaline Phos 38 - 126 U/L - 94 86  AST 15 - 41 U/L - 16 17  ALT 0 - 44 U/L - 19 15    Lab Results  Component Value Date   WBC 7.7 10/30/2020   HGB 13.8 10/30/2020   HCT 41.7 10/30/2020   MCV 86.7 10/30/2020   PLT 235 10/30/2020   NEUTROABS 4.9 10/30/2020    ASSESSMENT & PLAN:  Malignant neoplasm of lower-outer quadrant of right breast of female, estrogen receptor negative  ([place]) 10/06/2018:Screening mammogram detected right breast mass. Diagnostic mammogram showed 25m mass with adjacent 350mmass in the right breast with no axillary adenopathy. 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    Recommendations: 1. Breast conserving surgery 11/03/2018: IDC 0.5 cm, grade 3, 0/2 lymph nodes negative, triple negative, Ki-67 90% 2. Adjuvant radiation therapy 12/29/2018-02/07/2019     Patient works as a [employer], applicationsnd her husband Kaitlyn Bihariorks as a poEngineer, structural---------------------------------------------------------------------------------------------------------------------------------------- Breast Cancer Surveillance: 1. Mammogram: 09/10/2020: Benign, density cat B 2. Breast Exam: 03/15/2021 Benign   Back pain and abdominal discomfort: CT CAP 09/16/2019: No evidence of metastatic disease.   Frequent upper respiratory and GI infections: I instructed her to use masks at work and also take probiotics to improve her get bacterial health.  I sent a prescription for Florastor   Return to clinic in 1 year  for follow-up    No orders of the defined types were placed in this encounter.  The patient has a good understanding of the overall plan. she agrees with it. she will call with any problems that may develop before the next visit here.  Total time spent: 20 mins including face to face time and time spent for planning, charting and coordination of care  Rulon Eisenmenger, MD, MPH 03/15/2021  I, Thana Ates, am acting as scribe for Dr. Nicholas Lose.  I have reviewed the above documentation for accuracy and completeness, and I agree with the above.

## 2021-03-15 ENCOUNTER — Other Ambulatory Visit: Payer: Self-pay

## 2021-03-15 ENCOUNTER — Inpatient Hospital Stay: Payer: 59 | Attending: Hematology and Oncology | Admitting: Hematology and Oncology

## 2021-03-15 DIAGNOSIS — J988 Other specified respiratory disorders: Secondary | ICD-10-CM | POA: Insufficient documentation

## 2021-03-15 DIAGNOSIS — C50511 Malignant neoplasm of lower-outer quadrant of right female breast: Secondary | ICD-10-CM | POA: Diagnosis present

## 2021-03-15 DIAGNOSIS — Z171 Estrogen receptor negative status [ER-]: Secondary | ICD-10-CM

## 2021-03-15 DIAGNOSIS — Z923 Personal history of irradiation: Secondary | ICD-10-CM | POA: Insufficient documentation

## 2021-03-15 MED ORDER — SACCHAROMYCES BOULARDII 250 MG PO CAPS: 250.0000 mg | ORAL_CAPSULE | Freq: Two times a day (BID) | ORAL | 3 refills | Status: AC

## 2021-08-22 ENCOUNTER — Other Ambulatory Visit: Payer: Self-pay | Admitting: Hematology and Oncology

## 2021-08-22 DIAGNOSIS — Z1231 Encounter for screening mammogram for malignant neoplasm of breast: Secondary | ICD-10-CM

## 2021-09-13 ENCOUNTER — Ambulatory Visit
Admission: RE | Admit: 2021-09-13 | Discharge: 2021-09-13 | Disposition: A | Discharge status: Home / Self Care | Payer: 59 | Source: Ambulatory Visit | Attending: Hematology and Oncology | Admitting: Hematology and Oncology

## 2021-09-13 DIAGNOSIS — Z1231 Encounter for screening mammogram for malignant neoplasm of breast: Secondary | ICD-10-CM

## 2022-03-14 NOTE — Progress Notes (Signed)
Patient Care Team: Kaitlyn Queen, MD as PCP - General (Obstetrics and Gynecology) Kaitlyn Lose, MD as Consulting Physician (Hematology and Oncology) Kaitlyn Rudd, MD as Consulting Physician (Radiation Oncology) Kaitlyn Luna, MD as Consulting Physician (General Surgery)  DIAGNOSIS: No diagnosis found.  SUMMARY OF ONCOLOGIC HISTORY: Oncology History  Malignant neoplasm of lower-outer quadrant of right breast of female, estrogen receptor negative ([place])  09/27/2018 Cancer Staging   Staging form: Breast, AJCC 8th Edition - Clinical stage from 09/27/2018: Stage IB (cT1b, cN0, cM0, G3, ER-, PR-, HER2-) - Signed by Kaitlyn Phlegm, NP on 10/06/2018   10/06/2018 Initial Diagnosis   Screening mammogram detected right breast mass. Diagnostic mammogram showed 28m mass with adjacent 356mmass in the right breast with no axillary adenopathy. 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  The report date is 10/13/2018.  The Common Hereditary Gene Panel offered by [facility] includes sequencing and/or deletion duplication testing of the following 48 genes: APC, ATM, AXIN2, BARD1, BMPR1A, BRCA1, BRCA2, BRIP1, CDH1, CDK4, CDKN2A (p14ARF), CDKN2A (p16INK4a), CHEK2, CTNNA1, DICER1, EPCAM (Deletion/duplication testing only), GREM1 (promoter region deletion/duplication testing only), KIT, MEN1, MLH1, MSH2, MSH3, MSH6, MUTYH, NBN, NF1, NHTL1, PALB2, PDGFRA, PMS2, POLD1, POLE, PTEN, RAD50, RAD51C, RAD51D, RNF43, SDHB, SDHC, SDHD, SMAD4, SMARCA4. STK11, TP53, TSC1, TSC2, and VHL.  The following genes were evaluated for sequence changes only: SDHA and HOXB13 c.251G>A variant only.    11/03/2018 Surgery   Right lumpectomy ([facility]) ([id number] IDC, 0.5cm, grade 3,  clear margins, 2 axillary lymph nodes negative.  Triple negative with a Ki-67 of 90%   11/10/2018 Cancer Staging   Staging form: Breast, AJCC 8th Edition - Pathologic: Stage IB (pT1a, pN0, cM0, G3, ER-, PR-, HER2-) - Signed by [name] on 11/10/2018   12/28/2018 - 02/10/2019 Radiation Therapy   The patient initially received a dose of 50.4 Gy in 28 fractions to the breast using whole-breast tangent fields. This was delivered using a 3-D conformal technique. The patient then received a boost to the seroma. This delivered an additional 10 Gy in 5 fractions using a 3-field photon boost technique. The total dose was 60.4 Gy.     CHIEF COMPLIANT:   INTERVAL HISTORY: Kaitlyn KAVANAUGHs a   ALLERGIES:  has No Known Allergies.  MEDICATIONS:  Current Outpatient Medications  Medication Sig Dispense Refill   Erenumab-aooe (AIMOVIG) 140 MG/ML SOAJ Inject 140 mg into the skin every 30 (thirty) days. 1.12 mg    Melatonin 10 MG TABS Take 1 tablet by mouth at bedtime as needed.     NUVARING 0.12-0.015 MG/24HR vaginal ring Place 1 each vaginally every 30 (thirty) days.     pantoprazole (PROTONIX) 40 MG tablet Take 1 tablet (40 mg total) by mouth daily. 90 tablet 1   promethazine (PHENERGAN) 25 MG tablet      rizatriptan (MAXALT) 10 MG tablet Take 10 mg by mouth as needed for migraine. May repeat in 2 hours if needed     saccharomyces boulardii (FLORASTOR) 250 MG capsule Take 1 capsule (250 mg total) by mouth 2 (two) times daily. 180 capsule 3   sertraline (ZOLOFT) 100 MG tablet Take 1.5 tablets (150 mg total) by mouth daily. PATIENT NEEDS OFFICE VISIT FOR ADDITIONAL REFILLS 45 tablet 0  topiramate (TOPAMAX) 100 MG tablet Take 100 mg by mouth daily.     Ubrogepant (UBRELVY) 100 MG TABS Take 1 tablet by mouth as needed.     No current facility-administered medications for this visit.    PHYSICAL EXAMINATION: ECOG PERFORMANCE STATUS: {CHL ONC ECOG [phone]}  There were no vitals filed  for this visit. There were no vitals filed for this visit.  BREAST:*** No palpable masses or nodules in either right or left breasts. No palpable axillary supraclavicular or infraclavicular adenopathy no breast tenderness or nipple discharge. (exam performed in the presence of a chaperone)  LABORATORY DATA:  I have reviewed the data as listed    Latest Ref Rng & Units 10/30/2020    7:27 PM 09/08/2019    9:24 AM 11/01/2018   11:30 AM  CMP  Glucose 70 - 99 mg/dL 114  98  106   BUN 6 - 20 mg/dL _0 Creatinine 0.44 - 1.00 mg/dL 0.84  0.98  0.97   Sodium 135 - 145 mmol/L 139  139  139   Potassium 3.5 - 5.1 mmol/L 3.6  4.2  3.7   Chloride 98 - 111 mmol/L 110  110  109   CO2 22 - 32 mmol/L _1 Calcium 8.9 - 10.3 mg/dL 8.1  9.3  9.0   Total Protein 6.5 - 8.1 g/dL  7.1  6.7   Total Bilirubin 0.3 - 1.2 mg/dL  0.2  0.2   Alkaline Phos 38 - 126 U/L  94  86   AST 15 - 41 U/L  16  17   ALT 0 - 44 U/L  19  15     Lab Results  Component Value Date   WBC 7.7 10/30/2020   HGB 13.8 10/30/2020   HCT 41.7 10/30/2020   MCV 86.7 10/30/2020   PLT 235 10/30/2020   NEUTROABS 4.9 10/30/2020    ASSESSMENT & PLAN:  No problem-specific Assessment & Plan notes found for this encounter.    No orders of the defined types were placed in this encounter.  The patient has a good understanding of the overall plan. she agrees with it. she will call with any problems that may develop before the next visit here. Total time spent: 30 mins including face to face time and time spent for planning, charting and co-ordination of care   [name], [place] 03/14/22    I Gardiner Coins am acting as a Education administrator for [employer]  ***

## 2022-03-17 ENCOUNTER — Inpatient Hospital Stay: Payer: 59 | Attending: Hematology and Oncology | Admitting: Hematology and Oncology

## 2022-03-17 VITALS — BP 142/97 | HR 81 | Temp 97.3°F | Resp 18 | Ht 62.0 in | Wt 238.7 lb

## 2022-03-17 DIAGNOSIS — C50511 Malignant neoplasm of lower-outer quadrant of right female breast: Secondary | ICD-10-CM | POA: Diagnosis not present

## 2022-03-17 DIAGNOSIS — Z171 Estrogen receptor negative status [ER-]: Secondary | ICD-10-CM

## 2022-03-17 DIAGNOSIS — Z923 Personal history of irradiation: Secondary | ICD-10-CM | POA: Insufficient documentation

## 2022-03-17 DIAGNOSIS — Z853 Personal history of malignant neoplasm of breast: Secondary | ICD-10-CM | POA: Insufficient documentation

## 2022-03-17 NOTE — Assessment & Plan Note (Signed)
10/06/2018:Screening mammogram detected right breast mass. Diagnostic mammogram showed 28m mass with adjacent 364mmass in the right breast with no axillary adenopathy. 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    Recommendations: 1. Breast conserving surgery 11/03/2018: IDC 0.5 cm, grade 3, 0/2 lymph nodes negative, triple negative, Ki-67 90% 2. Adjuvant radiation therapy 12/29/2018-02/07/2019     Patient works as a [employer], applicationsnd her husband Kaitlyn Bihariorks as a poEngineer, structural---------------------------------------------------------------------------------------------------------------------------------------- Breast Cancer Surveillance: 1. Mammogram: 09/16/2021: Benign, density cat B 2. Breast Exam: 03/17/2022 benign   Back pain and abdominal discomfort: CT CAP 09/16/2019: No evidence of metastatic disease.    Return to clinic in 1 year for follow-up

## 2022-04-30 ENCOUNTER — Other Ambulatory Visit: Payer: Self-pay

## 2022-04-30 DIAGNOSIS — C50511 Malignant neoplasm of lower-outer quadrant of right female breast: Secondary | ICD-10-CM

## 2022-04-30 NOTE — Progress Notes (Signed)
Per md orders entered for signatera. Requisition and all supported documents faxed to [phone]. Faxed confirmation was received.  

## 2022-06-18 ENCOUNTER — Telehealth: Payer: Self-pay

## 2022-06-18 LAB — SIGNATERA ONLY (NATERA MANAGED)
SIGNATERA MTM READOUT: 0 MTM/ml
SIGNATERA TEST RESULT: NEGATIVE

## 2022-06-18 NOTE — Telephone Encounter (Signed)
Called and LVM stating [facility] results are negative. Gave call back number with any further questions.

## 2022-07-31 ENCOUNTER — Other Ambulatory Visit: Payer: Self-pay | Admitting: Obstetrics and Gynecology

## 2022-07-31 DIAGNOSIS — Z1231 Encounter for screening mammogram for malignant neoplasm of breast: Secondary | ICD-10-CM

## 2022-08-17 LAB — SIGNATERA
SIGNATERA MTM READOUT: 0 MTM/ml
SIGNATERA TEST RESULT: NEGATIVE

## 2022-08-20 ENCOUNTER — Telehealth: Payer: Self-pay

## 2022-08-20 NOTE — Telephone Encounter (Signed)
Attempted to call pt regarding [employer]atera results lvm for pt to return call back to get results. Results was negative. 

## 2022-09-16 ENCOUNTER — Ambulatory Visit
Admission: RE | Admit: 2022-09-16 | Discharge: 2022-09-16 | Disposition: A | Discharge status: Home / Self Care | Payer: 59 | Source: Ambulatory Visit | Attending: Obstetrics and Gynecology | Admitting: Obstetrics and Gynecology

## 2022-09-16 DIAGNOSIS — Z1231 Encounter for screening mammogram for malignant neoplasm of breast: Secondary | ICD-10-CM

## 2022-11-12 ENCOUNTER — Telehealth: Payer: Self-pay

## 2022-11-12 NOTE — Telephone Encounter (Signed)
Called pt per MD to advise [employer] testing was negative/not detected. Pt verbalized understanding of results and knows [facility] will be in touch to schedule 3 mo repeat lab.   

## 2023-02-06 ENCOUNTER — Telehealth: Payer: Self-pay

## 2023-02-06 LAB — SIGNATERA
SIGNATERA MTM READOUT: 0 MTM/ml
SIGNATERA TEST RESULT: NEGATIVE

## 2023-02-06 NOTE — Telephone Encounter (Signed)
Called pt per MD to advise [employer] testing was negative/not detected. Pt verbalized understanding of results and knows [facility] will be in touch to schedule 3-6 mo repeat lab.

## 2023-02-09 ENCOUNTER — Ambulatory Visit: Payer: 59 | Admitting: Neurology

## 2023-02-09 ENCOUNTER — Encounter: Payer: Self-pay | Admitting: Neurology

## 2023-02-09 ENCOUNTER — Telehealth: Payer: Self-pay | Admitting: *Deleted

## 2023-02-09 VITALS — BP 131/84 | HR 92 | Ht 62.0 in | Wt 230.2 lb

## 2023-02-09 DIAGNOSIS — R0683 Snoring: Secondary | ICD-10-CM

## 2023-02-09 DIAGNOSIS — R0681 Apnea, not elsewhere classified: Secondary | ICD-10-CM

## 2023-02-09 DIAGNOSIS — R519 Headache, unspecified: Secondary | ICD-10-CM

## 2023-02-09 DIAGNOSIS — G43709 Chronic migraine without aura, not intractable, without status migrainosus: Secondary | ICD-10-CM

## 2023-02-09 MED ORDER — AJOVY 225 MG/1.5ML ~~LOC~~ SOAJ: 225.0000 mg | SUBCUTANEOUS | 11 refills | Status: DC

## 2023-02-09 MED ORDER — AJOVY 225 MG/1.5ML ~~LOC~~ SOAJ: 225.0000 mg | SUBCUTANEOUS | Status: DC

## 2023-02-09 MED ORDER — TOSYMRA 10 MG/ACT NA SOLN: 1.0000 | NASAL | 11 refills | Status: DC | PRN

## 2023-02-09 MED ORDER — PROPRANOLOL HCL 20 MG PO TABS: 20.0000 mg | ORAL_TABLET | Freq: Two times a day (BID) | ORAL | 6 refills | Status: DC | PRN

## 2023-02-09 MED ORDER — AJOVY 225 MG/1.5ML ~~LOC~~ SOAJ: 225.0000 mg | SUBCUTANEOUS | 0 refills | Status: DC

## 2023-02-09 NOTE — Telephone Encounter (Signed)
Chronic Migraine CPT 64615  Botox [id number] Units:200  G43.709 Chronic Migraine without aura, not intractable, without status migrainous   

## 2023-02-09 NOTE — Progress Notes (Signed)
[id number]    Provider:  Dr Lucia Gaskins Requesting Provider: Evette Doffing, DO Primary Care Provider:  Marcelle Overlie, MD  CC:  Migraines  HPI:  [name] is a 47 y.o. female here as requested by Evette Doffing, DO for Migraines. has Migraine without aura; Esophageal reflux; Depression with anxiety; Malignant neoplasm of lower-outer quadrant of right breast of female, estrogen receptor negative (HCC); Family history of melanoma; Family history of prostate cancer; Family history of cervical cancer; Genetic testing; Dermatitis; Elevated LDL cholesterol level; Major depressive disorder, single episode; and Obesity, Class II, BMI 35-39.9 on their problem list.  Her blood pressure spikes during a migraine. The first time it happened she turned to the left and her face was numb. Her BP was 220 systolic. Her bp is usually 120/80 except when the migraines are severe can be very random and no rhyme or reason. She went to urgent care and she went to [facility] she had a scan and was told her BP increase was possibly part of a migraine aura. This was in 2022. She can feel the tingling and numbness on the left side of the face and she takes her BP and she will start getting a headache. She was pre-treated with tylenol and the migraine was relived and the BP improved with treating of migraine and drinking fluids. Started with Aimovig and she took it for years then she went without it due to insurance and then started the Emgality in the spring/summer and she has been on that less than a year. Prior to emgality she would have 15 migraine days a month and > 8 migraine days mod to severe. She wakes with migraines. She takes ubrelvy/maxalt/phenergan. On Emgality she still has migraines. Weather is huge trigger, stress is a trigger.   Reviewed notes, labs and imaging from outside physicians, which showed :  Medications Tried to Date and Ineffective: acetaminophen, zolmitriptan, rizatriptan,  sumatriptan, Zofran/ondansetron, Celebrex, prednisone, venlafaxine/Effexor, Zoloft, Migrelief, topiramate, ubrelvy, emagality, prpramolol c/I due to hypotention  11/20/2022 ct head : CT HEAD WITHOUT CONTRAST   TECHNIQUE: Contiguous axial images were obtained from the base of the skull through the vertex without intravenous contrast.   COMPARISON:  None.   FINDINGS: Brain: The brain shows a normal appearance without evidence of malformation, atrophy, old or acute small or large vessel infarction, mass lesion, hemorrhage, hydrocephalus or extra-axial collection.   Vascular: No hyperdense vessel. No evidence of atherosclerotic calcification.   Skull: Normal.  No traumatic finding.  No focal bone lesion.   Sinuses/Orbits: Sinuses are clear. Orbits appear normal. Mastoids are clear.   Other: None significant     Latest Ref Rng & Units 10/30/2020    7:27 PM 09/08/2019    9:24 AM 11/01/2018   11:30 AM  CMP  Glucose 70 - 99 mg/dL 478  98  295   BUN 6 - 20 mg/dL 12  10  10    Creatinine 0.44 - 1.00 mg/dL 6.21  3.08  6.57   Sodium 135 - 145 mmol/L 139  139  139   Potassium 3.5 - 5.1 mmol/L 3.6  4.2  3.7   Chloride 98 - 111 mmol/L 110  110  109   CO2 22 - 32 mmol/L 19  19  20    Calcium 8.9 - 10.3 mg/dL 8.1  9.3  9.0   Total Protein 6.5 - 8.1 g/dL  7.1  6.7   Total Bilirubin 0.3 - 1.2 mg/dL  0.2  0.2   Alkaline  Phos 38 - 126 U/L  94  86   AST 15 - 41 U/L  16  17   ALT 0 - 44 U/L  19  15       Latest Ref Rng & Units 10/30/2020    7:27 PM 11/01/2018   11:30 AM  CBC  WBC 4.0 - 10.5 K/uL 7.7  7.7   Hemoglobin 12.0 - 15.0 g/dL 40.1  02.7   Hematocrit 36.0 - 46.0 % 41.7  47.4   Platelets 150 - 400 K/uL 235  248      Review of Systems: Patient complains of symptoms per HPI as well as the following symptoms anxiety. Pertinent negatives and positives per HPI. All others negative.   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  Other Topics Concern   Not on file  Social History Narrative   Not on file   Social Determinants of Health   Financial Resource Strain: Low Risk  (04/02/2022)   Received from [facility], [facility]   Overall Financial Resource Strain (CARDIA)    Difficulty of Paying Living Expenses: Not hard at all  Food Insecurity: No Food Insecurity (04/02/2022)   Received from [facility], [facility]   Hunger Vital Sign    Worried About Running Out of Food in the Last Year: Never true    Ran Out of Food in the Last Year: Never true  Transportation Needs: No Transportation Needs (04/02/2022)   Received from [facility], [facility]   PRAPARE - Transportation    Lack of Transportation (Medical): No    Lack of Transportation (Non-Medical): No  Physical Activity: Unknown (04/02/2022)   Received from [facility]   Exercise Vital Sign    Days of Exercise per Week: 0 days    Minutes of Exercise per Session: Not on file  Recent Concern: Physical Activity - Inactive (04/02/2022)   Received from [facility], [facility]   Exercise Vital Sign    Days of Exercise per Week: 0 days    Minutes of Exercise per Session: 0 min  Stress: No Stress Concern Present (04/02/2022)   Received from [facility], [facility] - Occupational Stress Questionnaire    Feeling of Stress : Only a little  Social Connections: Socially Integrated (04/02/2022)   Received from [facility], [facility]   Social Network    How would you rate your  social network (family, work, friends)?: Good participation with social networks  Intimate Partner Violence: Not At Risk (04/02/2022)   Received from [facility], [facility]   HITS    Over the last 12 months how often did your partner physically hurt you?: Never    Over the last 12 months how often did your partner insult you or talk down to you?: Never    Over the last 12 months how often did your  partner threaten you with physical harm?: Never    Over the last 12 months how often did your partner scream or curse at you?: Never    Family History  Problem Relation Age of Onset   Hypertension Mother    Migraines Mother    Hypertension Father    Migraines Sister    Melanoma Maternal Aunt 37       sun exposure   Migraines Paternal Aunt    Heart failure Maternal Grandmother    Emphysema Maternal Grandmother    Migraines Maternal Grandmother    Diabetes Maternal Grandfather    Parkinson's disease Paternal Grandmother    Heart disease Paternal Grandfather    Prostate cancer Paternal Grandfather 25       not metastatic   Cervical cancer Maternal Great-grandmother    Breast cancer Neg Hx     Past Medical History:  Diagnosis Date   Allergy    Breast cancer (HCC) 08/2018   Right breast cancer   Depression    Family history of cervical cancer    Family history of melanoma    Family history of prostate cancer    GERD (gastroesophageal reflux disease)    Migraine    Personal history of radiation therapy     Patient Active Problem List   Diagnosis Date Noted   Genetic testing 10/13/2018   Family history of melanoma    Family history of prostate cancer    Family history of cervical cancer    Malignant neoplasm of lower-outer quadrant of right breast of female, estrogen receptor negative (HCC) 10/06/2018   Obesity, Class II, BMI 35-39.9 02/02/2018   Elevated LDL cholesterol level 01/05/2017   Dermatitis 01/31/2016   Migraine without aura 04/21/2011   Esophageal reflux  04/21/2011   Depression with anxiety 04/21/2011   Major depressive disorder, single episode 04/21/2011    Past Surgical History:  Procedure Laterality Date   ADENOIDECTOMY     APPENDECTOMY     BREAST LUMPECTOMY     BREAST LUMPECTOMY WITH RADIOACTIVE SEED AND SENTINEL LYMPH NODE BIOPSY Right 11/03/2018   Procedure: RIGHT BREAST LUMPECTOMY WITH RADIOACTIVE SEED AND RIGHT SENTINEL LYMPH NODE MAPPING;  Surgeon: Harriette Bouillon, MD;  Location: [facility];  Service: General;  Laterality: Right;   CESAREAN SECTION     OPERATIVE ULTRASOUND Right 11/03/2018   Procedure: OPERATIVE ULTRASOUND;  Surgeon: Harriette Bouillon, MD;  Location: [facility];  Service: General;  Laterality: Right;   PORT-A-CATH REMOVAL N/A 12/09/2018   Procedure: PORT REMOVAL;  Surgeon: Harriette Bouillon, MD;  Location: [facility];  Service: General;  Laterality: N/A;   PORTACATH PLACEMENT Right 11/03/2018   Procedure: INSERTION PORT-A-CATH;  Surgeon: Harriette Bouillon, MD;  Location: [facility];  Service: General;  Laterality: Right;    Current Outpatient Medications  Medication Sig Dispense Refill   Fremanezumab-vfrm (AJOVY) 225 MG/1.5ML SOAJ Inject 225 mg into the skin every 30 (thirty) days. Please run copay card: BIN# [id number] PCN# [id number] GRP# [id number] ID# [id number] 1.5 mL 11   Fremanezumab-vfrm (AJOVY) 225 MG/1.5ML SOAJ Inject 225 mg into the skin every 30 (thirty) days.     Fremanezumab-vfrm (AJOVY) 225 MG/1.5ML SOAJ Inject 225 mg into the skin every 30 (thirty) days. 1.5 mL 0   NUVARING 0.12-0.015 MG/24HR vaginal ring Place 1 each vaginally every 30 (thirty) days.     pantoprazole (PROTONIX) 40 MG tablet Take 1 tablet (40 mg total) by mouth daily. 90 tablet 1   promethazine (  PHENERGAN) 25 MG tablet      propranolol (INDERAL) 20 MG tablet Take 1 tablet (20 mg total) by mouth 2 (two) times daily as needed. Take for migraine and HTN during migraines 60 tablet 6   rizatriptan  (MAXALT) 10 MG tablet Take 10 mg by mouth as needed for migraine. May repeat in 2 hours if needed     saccharomyces boulardii (FLORASTOR) 250 MG capsule Take 1 capsule (250 mg total) by mouth 2 (two) times daily. 180 capsule 3   sertraline (ZOLOFT) 100 MG tablet Take 1.5 tablets (150 mg total) by mouth daily. PATIENT NEEDS OFFICE VISIT FOR ADDITIONAL REFILLS 45 tablet 0   SUMAtriptan (TOSYMRA) 10 MG/ACT SOLN Place 1 spray into the nose every hour as needed (max 3 doses per day). 6 each 11   topiramate (TOPAMAX) 100 MG tablet Take 100 mg by mouth daily.     Ubrogepant (UBRELVY) 100 MG TABS Take 1 tablet by mouth as needed.     No current facility-administered medications for this visit.    Allergies as of 02/09/2023   (Not on File)    Vitals: BP 131/84   Pulse 92   Ht 5\' 2"  (1.575 m)   Wt 230 lb 3.2 oz (104.4 kg)   BMI 42.10 kg/m  Last Weight:  Wt Readings from Last 1 Encounters:  02/09/23 230 lb 3.2 oz (104.4 kg)   Last Height:   Ht Readings from Last 1 Encounters:  02/09/23 5\' 2"  (1.575 m)     Physical exam: Exam: Gen: NAD, conversant, well nourised, obese, well groomed                     CV: RRR, no MRG. No Carotid Bruits. No peripheral edema, warm, nontender Eyes: Conjunctivae clear without exudates or hemorrhage  Neuro: Detailed Neurologic Exam  Speech:    Speech is normal; fluent and spontaneous with normal comprehension.  Cognition:    The patient is oriented to person, place, and time;     recent and remote memory intact;     language fluent;     normal attention, concentration,     fund of knowledge Cranial Nerves:    The pupils are equal, round, and reactive to light. The fundi are normal and spontaneous venous pulsations are present. Visual fields are full to finger confrontation. Extraocular movements are intact. Trigeminal sensation is intact and the muscles of mastication are normal. The face is symmetric. The palate elevates in the midline. Hearing  intact. Voice is normal. Shoulder shrug is normal. The tongue has normal motion without fasciculations.   Coordination: nml  Gait: nml  Motor Observation:    No asymmetry, no atrophy, and no involuntary movements noted. Tone:    Normal muscle tone.    Posture:    Posture is normal. normal erect    Strength:    Strength is V/V in the upper and lower limbs.      Sensation: intact to LT     Reflex Exam:  DTR's:    Deep tendon reflexes in the upper and lower extremities are normal bilaterally.   Toes:    The toes are downgoing bilaterally.   Clonus:    Clonus is absent.    Assessment/Plan:  patient with chronic migraines. Usually her BP runs normal and propranolol as a preventative would be contraindicated daily due to hypotension but during migraines her blood pressure significanlty increases due to stress and pain to 200 systolic will try to  redue migraine severity and frequency and add acute treatment additionally with propranolol acutely see if it helps  Sleep Review: Over a year ago she had a sleep test. A home test. Snores heavily, wake up with migraines, husband says she stops breathing, fatigued during the day: Please have Dr. Wilnette Kales review her at home sleep test prior to scheduling. Patient says it was negative nut she snores heavily, has morning headaches, is obese, husband says snores terribly.  Options prevention: Have sleep team review for repeat in-office sleep test. Changes include stopping Emgality and Trying: Ajovy(Monthly Injection) or Vyepti(once every 3 month infusion) or Qulipa(pill) AND botox (every 3 months). SWITCH to AJOVY and GET BOTOX APPROVED(May not want botox, will consider, in the meantime can see if insurance will approve both)  Acute Management: Tried imitrex oral and nasal generic in the past, will try Tosymra (My Scripts) (Tosymra OR Maxalt but not together). Also add propranolol 20mg  to your acute regimen  Patient has migraines with  nausea/vomiting that prohibit the use of oral triptans. [facility]HS recommends a non-oral triptan for such patients.1,2 Patient has morning and rapid onset migraines for which an oral triptan is NOT appropriate due to slow onset of action.  [facility]HS recommends a non-oral triptan for morning or rapid-onset migraines,2 to provide rapid pain relief and minimize migraine-associated disability.  Tosymra provides onset of pain relief in as few as 10 minutes.3 Patient has migraines of varying presentation, including some with [nausea/vomiting, morning onset, rapid-onset], requiring a non-oral AND oral triptan to manage all migraine types. AHS recommends a non-oral triptan for such patients.1,2    Patient has failed an adequate trial of generic sumatriptan Nasal  Orders Placed This Encounter  Procedures   Ambulatory referral to Sleep Studies   Meds ordered this encounter  Medications   propranolol (INDERAL) 20 MG tablet    Sig: Take 1 tablet (20 mg total) by mouth 2 (two) times daily as needed. Take for migraine and HTN during migraines    Dispense:  60 tablet    Refill:  6   Fremanezumab-vfrm (AJOVY) 225 MG/1.5ML SOAJ    Sig: Inject 225 mg into the skin every 30 (thirty) days. Please run copay card: BIN# [id number] PCN# [id number] GRP# [id number] ID# [id number]    Dispense:  1.5 mL    Refill:  11    Patient has copay card; she can have medication regardless of insurance approval or copay amount. Please run copay card: BIN# [id number] PCN# [id number] GRP# [id number] ID# [id number]   Fremanezumab-vfrm (AJOVY) 225 MG/1.5ML SOAJ    Sig: Inject 225 mg into the skin every 30 (thirty) days.    Patient has copay card; she can have medication regardless of insurance approval or copay amount.   Fremanezumab-vfrm (AJOVY) 225 MG/1.5ML SOAJ    Sig: Inject 225 mg into the skin every 30 (thirty) days.    Dispense:  1.5 mL    Refill:  0    Order Specific Question:   Lot Number?    Answer:   [id number]    Order Specific Question:   Expiration Date?     Answer:   09/28/2023    Order Specific Question:   NDC    Answer:   66063-016-01 [[id number]]    Order Specific Question:   Quantity    Answer:   1   SUMAtriptan (TOSYMRA) 10 MG/ACT SOLN    Sig: Place 1 spray into the nose every hour as needed (max 3 doses per day).    Dispense:  6 each    Refill:  11    Cc: Evette Doffing, DO,  Marcelle Overlie, MD  Naomie Dean, MD  [facility] [address] [place], [place] [address]  Phone [phone] Fax [phone]

## 2023-02-09 NOTE — Patient Instructions (Addendum)
Options prevention: Have sleep team review for repeat in-office sleep test. Changes include stopping Emgality and Trying: Ajovy(Monthly Injection) or Vyepti(once every 3 month infusion) or Qulipa(pill) AND botox (every 3 months). SWITCH to AJOVY and GET BOTOX APPROVED(May not want botox, will consider, in the meantime can see if insurance will approve both)  Acute Management: Tried imitrex generic in the past, will try Tosymra (My Scripts) (Tosymra OR Maxalt but not together). Also add propranolol 20mg  to your acute regimen   Sumatriptan Nasal Spray What is this medication? SUMATRIPTAN (soo ma TRIP tan) treats migraines. It works by blocking pain signals and narrowing blood vessels in the brain. It belongs to a group of medications called triptans. It is not used to prevent migraines. This medicine may be used for other purposes; ask your health care provider or pharmacist if you have questions. COMMON BRAND NAME(S): Imitrex, Tosymra What should I tell my care team before I take this medication? They need to know if you have any of these conditions: Circulation problems in fingers and toes Diabetes Heart disease High blood pressure High cholesterol History of irregular heartbeat History of stroke Kidney disease Liver disease Stomach or intestine problems Tobacco use An unusual or allergic reaction to sumatriptan, other medications, foods, dyes, or preservatives Pregnant or trying to get pregnant Breastfeeding How should I use this medication? This medication is for use in the nose. Follow the directions on your product or prescription label. Do not use more often than directed. Make sure that you are using your nasal spray correctly. Ask your care team if you have any questions. Talk to your care team about the use of this medication in children. Special care may be needed. Overdosage: If you think you have taken too much of this medicine contact a poison control center or emergency room  at once. NOTE: This medicine is only for you. Do not share this medicine with others. What if I miss a dose? This does not apply. This medication is not for regular use. What may interact with this medication? Do not take this medication with any of the following: Certain medications for migraine headache, such as almotriptan, eletriptan, frovatriptan, naratriptan, rizatriptan, sumatriptan, zolmitriptan Ergot alkaloids, such as dihydroergotamine, ergonovine, ergotamine, methylergonovine MAOIs, such as Carbex, Eldepryl, Marplan, Nardil, and Parnate This medication may also interact with the following: Certain medications for mental health conditions This list may not describe all possible interactions. Give your health care provider a list of all the medicines, herbs, non-prescription drugs, or dietary supplements you use. Also tell them if you smoke, drink alcohol, or use illegal drugs. Some items may interact with your medicine. What should I watch for while using this medication? Visit your care team for regular checks on your progress. Tell your care team if your symptoms do not start to get better or if they get worse. This medication may affect your coordination, reaction time, or judgment. Do not drive or operate machinery until you know how this medication affects you. Sit up or stand slowly to reduce the risk of dizzy or fainting spells. Drinking alcohol with this medication can increase the risk of these side effects. Tell your care team right away if you have any change in your eyesight. If you take migraine medications for 10 or more days a month, your migraines may get worse. Keep a diary of headache days and medication use. Contact your care team if your migraine attacks occur more frequently. What side effects may I notice from receiving  this medication? Side effects that you should report to your care team as soon as possible: Allergic reactions--skin rash, itching, hives, swelling  of the face, lips, tongue, or throat Burning, pain, tingling, or color changes in the legs or feet Heart attack--pain or tightness in the chest, shoulders, arms, or jaw, nausea, shortness of breath, cold or clammy skin, feeling faint or lightheaded Heart rhythm changes--fast or irregular heartbeat, dizziness, feeling faint or lightheaded, chest pain, trouble breathing Increase in blood pressure Raynaud's--cool, numb, or painful fingers or toes that may change color from pale, to blue, to red Seizures Serotonin syndrome--irritability, confusion, fast or irregular heartbeat, muscle stiffness, twitching muscles, sweating, high fever, seizure, chills, vomiting, diarrhea Stroke--sudden numbness or weakness of the face, arm, or leg, trouble speaking, confusion, trouble walking, loss of balance or coordination, dizziness, severe headache, change in vision Sudden or severe stomach pain, nausea, vomiting, fever, or bloody diarrhea Vision loss Side effects that usually do not require medical attention (report to your care team if they continue or are bothersome): Change in taste Dizziness General discomfort or fatigue Irritation inside the nose or throat This list may not describe all possible side effects. Call your doctor for medical advice about side effects. You may report side effects to FDA at 1-800-FDA-1088. Where should I keep my medication? Keep out of the reach of children and pets. Store at room temperature between 2 and 30 degrees C (36 and 86 degrees F). Throw away any unused medication after the expiration date. NOTE: This sheet is a summary. It may not cover all possible information. If you have questions about this medicine, talk to your doctor, pharmacist, or health care provider.  2024 Elsevier/Gold Standard (2022-01-21 00:00:00) Vernell Barrier Injection What is this medication? FREMANEZUMAB (fre ma NEZ ue mab) prevents migraines. It works by blocking a substance in the body that  causes migraines. It is a monoclonal antibody. This medicine may be used for other purposes; ask your health care provider or pharmacist if you have questions. COMMON BRAND NAME(S): AJOVY What should I tell my care team before I take this medication? They need to know if you have any of these conditions: An unusual or allergic reaction to fremanezumab, other medications, foods, dyes, or preservatives Pregnant or trying to get pregnant Breast-feeding How should I use this medication? This medication is injected under the skin. You will be taught how to prepare and give it. Take it as directed on the prescription label. Keep taking it unless your care team tells you to stop. It is important that you put your used needles and syringes in a special sharps container. Do not put them in a trash can. If you do not have a sharps container, call your pharmacist or care team to get one. Talk to your care team about the use of this medication in children. Special care may be needed. Overdosage: If you think you have taken too much of this medicine contact a poison control center or emergency room at once. NOTE: This medicine is only for you. Do not share this medicine with others. What if I miss a dose? If you miss a dose, take it as soon as you can. If it is almost time for your next dose, take only that dose. Do not take double or extra doses. What may interact with this medication? Interactions are not expected. This list may not describe all possible interactions. Give your health care provider a list of all the medicines, herbs, non-prescription drugs, or dietary  supplements you use. Also tell them if you smoke, drink alcohol, or use illegal drugs. Some items may interact with your medicine. What should I watch for while using this medication? Tell your care team if your symptoms do not start to get better or if they get worse. What side effects may I notice from receiving this medication? Side  effects that you should report to your care team as soon as possible: Allergic reactions or angioedema--skin rash, itching or hives, swelling of the face, eyes, lips, tongue, arms, or legs, trouble swallowing or breathing Side effects that usually do not require medical attention (report to your care team if they continue or are bothersome): Pain, redness, or irritation at injection site This list may not describe all possible side effects. Call your doctor for medical advice about side effects. You may report side effects to FDA at 1-800-FDA-1088. Where should I keep my medication? Keep out of the reach of children and pets. Store in a refrigerator or at room temperature between 20 and 25 degrees C (68 and 77 degrees F). Refrigeration (preferred): Store in the refrigerator. Do not freeze. Keep in the original container until you are ready to take it. Remove the dose from the carton about 30 minutes before it is time for you to use it. If the dose is not used, it may be stored in the original container at room temperature for 7 days. Get rid of any unused medication after the expiration date. Room Temperature: This medication may be stored at room temperature for up to 7 days. Keep it in the original container. Protect from light until time of use. If it is stored at room temperature, get rid of any unused medication after 7 days or after it expires, whichever is first. To get rid of medications that are no longer needed or have expired: Take the medication to a medication take-back program. Check with your pharmacy or law enforcement to find a location. If you cannot return the medication, ask your pharmacist or care team how to get rid of this medication safely. NOTE: This sheet is a summary. It may not cover all possible information. If you have questions about this medicine, talk to your doctor, pharmacist, or health care provider.  2024 Elsevier/Gold Standard (2021-05-10 00:00:00)  Propranolol  Tablets What is this medication? PROPRANOLOL (proe PRAN oh lole) treats many conditions such as high blood pressure, tremors, and a type of arrhythmia known as AFib (atrial fibrillation). It works by lowering your blood pressure and heart rate, making it easier for your heart to pump blood to the rest of your body. It may be used to prevent migraine headaches. It works by relaxing the blood vessels in the brain that cause migraines. It belongs to a group of medications called beta blockers. This medicine may be used for other purposes; ask your health care provider or pharmacist if you have questions. COMMON BRAND NAME(S): Inderal What should I tell my care team before I take this medication? They need to know if you have any of these conditions: Diabetes Having surgery Heart or blood vessel conditions, such as slow heartbeat, heart failure, heart block Kidney disease Liver disease Lung or breathing disease, such as asthma or COPD Myasthenia gravis Pheochromocytoma Thyroid disease An unusual or allergic reaction to propranolol, other medications, foods, dyes, or preservatives Pregnant or trying to get pregnant Breastfeeding How should I use this medication? Take this medication by mouth. Take it as directed on the prescription label at the  same time every day. Keep taking it unless your care team tells you to stop. Talk to your care team about the use of this medication in children. Special care may be needed. Overdosage: If you think you have taken too much of this medicine contact a poison control center or emergency room at once. NOTE: This medicine is only for you. Do not share this medicine with others. What if I miss a dose? If you miss a dose, take it as soon as you can. If it is almost time for your next dose, take only that dose. Do not take double or extra doses. What may interact with this medication? Do not take this medication with any of the following: Thioridazine This  medication may also interact with the following: Certain medications for blood pressure, heart disease, irregular heartbeat Epinephrine NSAIDs, medications for pain and inflammation, such as ibuprofen or naproxen Warfarin Other medications may affect the way this medication works. Talk with your care team about all of the medications you take. They may suggest changes to your treatment plan to lower the risk of side effects and to make sure your medications work as intended. This list may not describe all possible interactions. Give your health care provider a list of all the medicines, herbs, non-prescription drugs, or dietary supplements you use. Also tell them if you smoke, drink alcohol, or use illegal drugs. Some items may interact with your medicine. What should I watch for while using this medication? Visit your care team for regular checks on your progress. Check your blood pressure as directed. Know what your blood pressure should be and when to contact your care team. This medication may affect your coordination, reaction time, or judgment. Do not drive or operate machinery until you know how this medication affects you. Sit up or stand slowly to reduce the risk of dizzy or fainting spells. Drinking alcohol with this medication can increase the risk of these side effects. Do not suddenly stop taking this medication. This may increase your risk of side effects, such as chest pain and heart attack. If you no longer need to take this medication, your care team will lower the dose slowly over time to decrease the risk of side effects. If you are going to need surgery or a procedure, tell your care team that you are using this medication. This medication may affect blood glucose levels. It can also mask the symptoms of low blood sugar, such as a rapid heartbeat and tremors. If you have diabetes, it is important to check your blood sugar often while you are taking this medication. Do not treat  yourself for coughs, colds, or pain while you are using this medication without asking your care team for advice. Some medications may increase your blood pressure. What side effects may I notice from receiving this medication? Side effects that you should report to your care team as soon as possible: Allergic reactions--skin rash, itching, hives, swelling of the face, lips, tongue, or throat Heart failure--shortness of breath, swelling of the ankles, feet, or hands, sudden weight gain, unusual weakness or fatigue Low blood pressure--dizziness, feeling faint or lightheaded, blurry vision Raynaud's--cool, numb, or painful fingers or toes that may change color from pale, to blue, to red Redness, blistering, peeling, or loosening of the skin, including inside the mouth Slow heartbeat--dizziness, feeling faint or lightheaded, confusion, trouble breathing, unusual weakness or fatigue Worsening mood, feelings of depression Side effects that usually do not require medical attention (report to your  care team if they continue or are bothersome): Change in sex drive or performance Diarrhea Dizziness Fatigue Headache This list may not describe all possible side effects. Call your doctor for medical advice about side effects. You may report side effects to FDA at 1-800-FDA-1088. Where should I keep my medication? Keep out of the reach of children and pets. Store at room temperature between 20 and 25 degrees C (68 and 77 degrees F). Protect from light. Throw away any unused medication after the expiration date. NOTE: This sheet is a summary. It may not cover all possible information. If you have questions about this medicine, talk to your doctor, pharmacist, or health care provider.  2024 Elsevier/Gold Standard (2022-03-17 00:00:00)

## 2023-02-09 NOTE — Telephone Encounter (Signed)
-----   Message from Anson Fret sent at 02/09/2023 11:29 AM EST ----- Regarding: Botox for migraines Please start botox approval [id number]. She is aon AJOVY, if it is between botox and Ajovy I'd like her Ajovy approved first

## 2023-02-10 MED ORDER — ONABOTULINUMTOXINA 200 UNITS IJ SOLR: INTRAMUSCULAR | 3 refills | Status: DC

## 2023-02-10 NOTE — Addendum Note (Signed)
Addended by: Bertram Savin on: 02/10/2023 10:07 AM   Modules accepted: Orders

## 2023-02-10 NOTE — Telephone Encounter (Signed)
Approved via medical benefit, auth#: [id number] (02/10/23-02/10/24).  Please send rx to [facility], thank you!

## 2023-02-16 NOTE — Telephone Encounter (Signed)
Yes, NP first avail. If no availability oin the next 30 days ask Aundra Millet if she can get her in, Aundra Millet has told me she may be able to squeeze in a botox patient if necessary but ask her if possible thanks

## 2023-02-16 NOTE — Telephone Encounter (Signed)
LVM asking pt to call back to schedule initial botox appointment

## 2023-02-23 NOTE — Telephone Encounter (Signed)
Sent mychart message asking pt to call me and schedule initial Botox appt.

## 2023-03-04 NOTE — Telephone Encounter (Signed)
Pt returned my call, she states she wants to hold off on scheduling Botox appointment for right now. She would like to wait and see if the new medications she was prescribed keep headaches away without Botox. I informed her that, as long as insurance remains the same, we have authorization through 01/2024. She will reach out if she decides to pursue Botox.

## 2023-03-20 ENCOUNTER — Inpatient Hospital Stay: Payer: 59 | Attending: Hematology and Oncology | Admitting: Hematology and Oncology

## 2023-03-20 VITALS — BP 150/97 | HR 95 | Temp 98.2°F | Resp 18 | Ht 62.0 in | Wt 228.5 lb

## 2023-03-20 DIAGNOSIS — Z923 Personal history of irradiation: Secondary | ICD-10-CM | POA: Diagnosis not present

## 2023-03-20 DIAGNOSIS — Z853 Personal history of malignant neoplasm of breast: Secondary | ICD-10-CM | POA: Diagnosis present

## 2023-03-20 DIAGNOSIS — C50511 Malignant neoplasm of lower-outer quadrant of right female breast: Secondary | ICD-10-CM

## 2023-03-20 DIAGNOSIS — Z171 Estrogen receptor negative status [ER-]: Secondary | ICD-10-CM

## 2023-03-20 NOTE — Assessment & Plan Note (Signed)
10/06/2018:Screening mammogram detected right breast mass. Diagnostic mammogram showed 8mm mass with adjacent 3mm mass in the right breast with no axillary adenopathy. 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    Recommendations: 1. Breast conserving surgery 11/03/2018: IDC 0.5 cm, grade 3, 0/2 lymph nodes negative, triple negative, Ki-67 90% 2. Adjuvant radiation therapy 12/29/2018-02/07/2019     Patient works as a Industrial/product designer and her husband Caryn Bee works as a Emergency planning/management officer. ---------------------------------------------------------------------------------------------------------------------------------------- Breast Cancer Surveillance: 1. Mammogram: 09/18/2022: Benign, density cat B 2. Breast Exam: 03/20/2023 benign   Back pain and abdominal discomfort: CT CAP 09/16/2019: No evidence of metastatic disease. Recommended doing Signatera for minimal residual disease detection.   Return to clinic in 1 year for follow-up

## 2023-03-20 NOTE — Progress Notes (Signed)
Patient Care Team: Marcelle Overlie, MD as PCP - General (Obstetrics and Gynecology) Serena Croissant, MD as Consulting Physician (Hematology and Oncology) Dorothy Puffer, MD as Consulting Physician (Radiation Oncology) Harriette Bouillon, MD as Consulting Physician (General Surgery)  DIAGNOSIS:  Encounter Diagnosis  Name Primary?   Malignant neoplasm of lower-outer quadrant of right breast of female, estrogen receptor negative (HCC) Yes    SUMMARY OF ONCOLOGIC HISTORY: Oncology History  Malignant neoplasm of lower-outer quadrant of right breast of female, estrogen receptor negative (HCC)  09/27/2018 Cancer Staging   Staging form: Breast, AJCC 8th Edition - Clinical stage from 09/27/2018: Stage IB (cT1b, cN0, cM0, G3, ER-, PR-, HER2-) - Signed by Loa Socks, NP on 10/06/2018   10/06/2018 Initial Diagnosis   Screening mammogram detected right breast mass. Diagnostic mammogram showed 8mm mass with adjacent 3mm mass in the right breast with no axillary adenopathy. 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  The report date is 10/13/2018.  The Common Hereditary Gene Panel offered by [facility] includes sequencing and/or deletion duplication testing of the following 48 genes: APC, ATM, AXIN2, BARD1, BMPR1A, BRCA1, BRCA2, BRIP1, CDH1, CDK4, CDKN2A (p14ARF), CDKN2A (p16INK4a), CHEK2, CTNNA1, DICER1, EPCAM (Deletion/duplication testing only), GREM1 (promoter region deletion/duplication testing only), KIT, MEN1, MLH1, MSH2, MSH3, MSH6, MUTYH, NBN, NF1, NHTL1, PALB2, PDGFRA, PMS2, POLD1, POLE, PTEN, RAD50, RAD51C, RAD51D, RNF43, SDHB, SDHC, SDHD, SMAD4, SMARCA4. STK11, TP53, TSC1, TSC2, and VHL.  The following genes were evaluated for sequence changes  only: SDHA and HOXB13 c.251G>A variant only.    11/03/2018 Surgery   Right lumpectomy ([facility]) [phone]): IDC, 0.5cm, grade 3, clear margins, 2 axillary lymph nodes negative.  Triple negative with a Ki-67 of 90%   11/10/2018 Cancer Staging   Staging form: Breast, AJCC 8th Edition - Pathologic: Stage IB (pT1a, pN0, cM0, G3, ER-, PR-, HER2-) - Signed by Serena Croissant, MD on 11/10/2018   12/28/2018 - 02/10/2019 Radiation Therapy   The patient initially received a dose of 50.4 Gy in 28 fractions to the breast using whole-breast tangent fields. This was delivered using a 3-D conformal technique. The patient then received a boost to the seroma. This delivered an additional 10 Gy in 5 fractions using a 3-field photon boost technique. The total dose was 60.4 Gy.     CHIEF COMPLIANT: Surveillance of breast cancer  HISTORY OF PRESENT ILLNESS:  History of Present Illness   The patient, with a history of breast cancer, presents for a routine follow-up. She reports no pain or discomfort in the breast.. She also takes Ajovy once a month, which is currently awaiting insurance approval.    ALLERGIES:  has no allergies on file.  MEDICATIONS:  Current Outpatient Medications  Medication Sig Dispense Refill   botulinum toxin Type A (BOTOX) 200 units injection Provider to inject 155 units into the muscles of the head and neck every 12 weeks. Discard remainder. 1 each 3   Fremanezumab-vfrm (AJOVY) 225 MG/1.5ML SOAJ Inject 225 mg into the skin every 30 (thirty) days. Please run copay card: BIN# [id number] PCN# [id number] GRP# [id number] ID# [id number] 1.5 mL 11   NUVARING 0.12-0.015 MG/24HR vaginal ring Place 1 each vaginally every 30 (thirty) days.     pantoprazole (PROTONIX) 40 MG tablet Take 1 tablet (40 mg total)  by mouth daily. 90 tablet 1   promethazine (PHENERGAN) 25 MG tablet      propranolol (INDERAL) 20 MG tablet Take 1 tablet (20 mg total) by mouth 2 (two) times daily as needed. Take for migraine and HTN  during migraines 60 tablet 6   rizatriptan (MAXALT) 10 MG tablet Take 10 mg by mouth as needed for migraine. May repeat in 2 hours if needed     saccharomyces boulardii (FLORASTOR) 250 MG capsule Take 1 capsule (250 mg total) by mouth 2 (two) times daily. 180 capsule 3   sertraline (ZOLOFT) 100 MG tablet Take 1.5 tablets (150 mg total) by mouth daily. PATIENT NEEDS OFFICE VISIT FOR ADDITIONAL REFILLS 45 tablet 0   SUMAtriptan (TOSYMRA) 10 MG/ACT SOLN Place 1 spray into the nose every hour as needed (max 3 doses per day). 6 each 11   topiramate (TOPAMAX) 100 MG tablet Take 100 mg by mouth daily.     Ubrogepant (UBRELVY) 100 MG TABS Take 1 tablet by mouth as needed.     No current facility-administered medications for this visit.    PHYSICAL EXAMINATION: ECOG PERFORMANCE STATUS: 1 - Symptomatic but completely ambulatory  Vitals:   03/20/23 1011  BP: (!) 150/97  Pulse: 95  Resp: 18  Temp: 98.2 F (36.8 C)  SpO2: 99%   Filed Weights   03/20/23 1011  Weight: 228 lb 8 oz (103.6 kg)    Physical Exam   BREAST: Mammogram in June showed low density, categorized as B.      (exam performed in the presence of a chaperone)  LABORATORY DATA:  I have reviewed the data as listed    Latest Ref Rng & Units 10/30/2020    7:27 PM 09/08/2019    9:24 AM 11/01/2018   11:30 AM  CMP  Glucose 70 - 99 mg/dL 829  98  562   BUN 6 - 20 mg/dL 12  10  10    Creatinine 0.44 - 1.00 mg/dL 1.30  8.65  7.84   Sodium 135 - 145 mmol/L 139  139  139   Potassium 3.5 - 5.1 mmol/L 3.6  4.2  3.7   Chloride 98 - 111 mmol/L 110  110  109   CO2 22 - 32 mmol/L 19  19  20    Calcium 8.9 - 10.3 mg/dL 8.1  9.3  9.0   Total Protein 6.5 - 8.1 g/dL  7.1  6.7   Total Bilirubin 0.3 - 1.2 mg/dL  0.2  0.2   Alkaline Phos 38 - 126 U/L  94  86   AST 15 - 41 U/L  16  17   ALT 0 - 44 U/L  19  15     Lab Results  Component Value Date   WBC 7.7 10/30/2020   HGB 13.8 10/30/2020   HCT 41.7 10/30/2020   MCV 86.7 10/30/2020   PLT  235 10/30/2020   NEUTROABS 4.9 10/30/2020    ASSESSMENT & PLAN:  Malignant neoplasm of lower-outer quadrant of right breast of female, estrogen receptor negative (HCC) 10/06/2018:Screening mammogram detected right breast mass. Diagnostic mammogram showed 8mm mass with adjacent 3mm mass in the right breast with no axillary adenopathy. 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    Recommendations: 1. Breast conserving surgery 11/03/2018: IDC 0.5 cm, grade 3, 0/2 lymph nodes negative, triple negative, Ki-67 90% 2. Adjuvant radiation therapy 12/29/2018-02/07/2019     Patient works as a Industrial/product designer and  her husband Caryn Bee works as a Emergency planning/management officer. ---------------------------------------------------------------------------------------------------------------------------------------- Breast Cancer Surveillance: 1. Mammogram: 09/18/2022: Benign, density cat B 2. Breast Exam: 03/20/2023 benign 3.  Signatera: Negative   Back pain and abdominal discomfort: CT CAP 09/16/2019: No evidence of metastatic disease. Recommended continuing Signatera for minimal residual disease detection.    General Health Maintenance -Encouraged to incorporate regular exercise, even short duration, into daily routine.      Return to clinic on an as-needed basis    No orders of the defined types were placed in this encounter.  The patient has a good understanding of the overall plan. she agrees with it. she will call with any problems that may develop before the next visit here. Total time spent: 30 mins including face to face time and time spent for planning, charting and co-ordination of care   Tamsen Meek, MD 03/20/23

## 2023-04-14 ENCOUNTER — Institutional Professional Consult (permissible substitution): Payer: 59 | Admitting: Neurology

## 2023-04-21 ENCOUNTER — Encounter: Payer: Self-pay | Admitting: Neurology

## 2023-04-21 ENCOUNTER — Telehealth: Payer: Self-pay | Admitting: *Deleted

## 2023-04-21 ENCOUNTER — Ambulatory Visit (INDEPENDENT_AMBULATORY_CARE_PROVIDER_SITE_OTHER): Payer: 59 | Admitting: Neurology

## 2023-04-21 VITALS — BP 148/86 | HR 88 | Ht 62.0 in | Wt 229.0 lb

## 2023-04-21 DIAGNOSIS — G4726 Circadian rhythm sleep disorder, shift work type: Secondary | ICD-10-CM | POA: Insufficient documentation

## 2023-04-21 DIAGNOSIS — G43831 Menstrual migraine, intractable, with status migrainosus: Secondary | ICD-10-CM | POA: Diagnosis not present

## 2023-04-21 DIAGNOSIS — R519 Headache, unspecified: Secondary | ICD-10-CM | POA: Insufficient documentation

## 2023-04-21 DIAGNOSIS — R0683 Snoring: Secondary | ICD-10-CM

## 2023-04-21 DIAGNOSIS — G4719 Other hypersomnia: Secondary | ICD-10-CM

## 2023-04-21 NOTE — Progress Notes (Signed)
SLEEP MEDICINE CLINIC    Provider:  Melvyn Novas, MD  Primary Care Physician:  Irven Coe, MD [address] [place] [address]     Referring Provider: Anson Fret, Md [address] [place],  [place] [address]          Chief Complaint according to patient   Patient presents with:     New Patient (Initial Visit)           HISTORY OF PRESENT ILLNESS:  [name] is a 48 y.o. female patient who is seen upon referral on 04/21/2023 from Dr Lucia Gaskins  for a Sleep consultations.  Chief concern according to patient :  "I wake up with Headaches in AM a lot, and I could have slept 12 hours and still feel  unrestored, unrefreshed and very tired in daytime, I snore very loudly and I am breathing irregular " .  Her last test was a HST through another office at [facility] and the night was highly unusual, her grandson was in her bed with her".    I have the pleasure of seeing [name] 04/21/23 a right-handed female with a possible sleep disorder.  I have not referred this patient today.  Her previous sleep study was a home sleep test through an Delphos device.  Performed through [facility] on 08-22-2021.  At the time her BMI was 42.8 her weight 238 pounds with a height of 62.5 inches.  The referring physician was Dr Sara Chu.   The home sleep test began at 9:47 PM and ended at 7:28 AM and the patient was monitored for a total of 581 minutes, 0 apneas were found but 7 hypopneas were observed oxygen nadir was 90% average heart rate was 84 bpm, the home sleep test revealed mild snoring no significant apnea.  Unfortunately, the Alice device cannot differentiate between recording time and actual sleep time.   As the patient reported this was an unusual night there were some disturbances.     Sleep relevant medical history: Nocturia 0-2 times  Sleep walking in childhood- , Tonsillectomy and adenoids , No TBI, no whiplash, no thyroid disease, cancer breast, dx  in  July 2020, surgery and radiation followed.    Family medical /sleep history: no other family member dx with OSA, M other is a sleep talker and snorer, too. Migraines.    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 No indoor pets.  The patient currently works nights - 12 hours 4 days on and 4 days off- gets off work 6.30 AM.  Tobacco use: none .   ETOH use : socially- cider or wine drinker- one drink every 14 days.  Caffeine intake in form of Coffee( at night - 1-3 cups) Soda( 1) Tea ( 1/d ) or energy drinks. Exercise; no routine.         Sleep habits are as follows: The patient's dinner time is between 4-5 PM. The patient goes to bed at 7 AM after night shift  and continues to sleep for 7-8 hours, wakes for 0-1 bathroom break.  She may eat at work at 4 AM  The preferred sleep position is lateral position, with the support of 2 pillows. Flat bed. Bedroom is cool, quiet and almost dark.  Dreams are reportedly  frequent.  The patient wakes up spontaneously and goes to work  at 6.00 PM , 3-4  PM is the usual rise time. She reports not  always feeling refreshed or restored in AM, with symptoms such as dry mouth, morning headaches, and residual fatigue.  Naps are taken frequently, when the opportunity arises. lasting several hours and are  refreshing .    Review of Systems: Out of a complete 14 system review, the patient complains of only the following symptoms, and all other reviewed systems are negative.:  Fatigue, sleepiness , long sleep time, hypersomnia  morning headaches, with sharp  stabbing sensation.    How likely are you to doze in the following situations: 0 = not likely, 1 = slight chance, 2 = moderate chance, 3 = high chance   Sitting and Reading?3 Watching Television?3 Sitting inactive in a public place (theater or meeting)?1 As a  passenger in a car for an hour without a break?3 Lying down in the afternoon when circumstances permit?3 Sitting and talking to someone?1 Sitting quietly after lunch without alcohol?2 In a car, while stopped for a few minutes in traffic?0   Total = 15/ 24 points   FSS endorsed at 50/ 63 points.   Social History   Socioeconomic History   Marital status: Married    Spouse name: Not on file   Number of children: 2 adopted daughters an one biological son, now 77.    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  Other Topics Concern   Not on file  Social History Narrative   Lives husband    Pt works    Social Drivers of Corporate investment banker Strain: Low Risk  (04/02/2022)   Received from [facility], [facility]   Overall Financial Resource Strain (CARDIA)    Difficulty of Paying Living Expenses: Not hard at all  Food Insecurity: No Food Insecurity (04/02/2022)   Received from [facility], [facility]   Hunger Vital Sign    Worried About Running Out of Food in the Last Year: Never true    Ran Out of Food in the Last Year: Never true  Transportation Needs: No Transportation Needs (04/02/2022)   Received from [facility], [facility]   PRAPARE - Transportation    Lack of Transportation (Medical): No    Lack of Transportation (Non-Medical): No  Physical Activity: Unknown (04/02/2022)   Received from [facility]   Exercise Vital Sign    Days of Exercise per Week: 0 days    Minutes of Exercise per Session: Not on file  Recent Concern: Physical Activity - Inactive (04/02/2022)   Received from [facility], [facility]   Exercise  Vital Sign    Days of Exercise per Week: 0 days    Minutes of Exercise per Session: 0 min  Stress: No Stress Concern Present (04/02/2022)   Received from [facility], [facility] of Occupational Health - Occupational Stress Questionnaire    Feeling of Stress : Only a little  Social Connections: Socially Integrated (04/02/2022)  Received from [facility], [facility]   Social Network    How would you rate your social network (family, work, friends)?: Good participation with social networks    Family History  Problem Relation Age of Onset   Hypertension Mother    Migraines Mother    Hypertension Father    Migraines Sister    Melanoma Maternal Aunt 56       sun exposure   Migraines Paternal Aunt    Heart failure Maternal Grandmother    Emphysema Maternal Grandmother    Migraines Maternal Grandmother    Diabetes Maternal Grandfather    Parkinson's disease Paternal Grandmother    Heart disease Paternal Grandfather    Prostate cancer Paternal Grandfather 75       not metastatic   Cervical cancer Maternal Great-grandmother    Breast cancer Neg Hx    Sleep apnea Neg Hx     Past Medical History:  Diagnosis Date   Allergy    Breast cancer (HCC) 08/2018   Right breast cancer   Depression    Family history of cervical cancer    Family history of melanoma    Family history of prostate cancer    GERD (gastroesophageal reflux disease)    Migraine    Personal history of radiation therapy     Past Surgical History:  Procedure Laterality Date   ADENOIDECTOMY     APPENDECTOMY     BREAST LUMPECTOMY     BREAST LUMPECTOMY WITH RADIOACTIVE SEED AND SENTINEL LYMPH NODE BIOPSY Right 11/03/2018   Procedure: RIGHT BREAST LUMPECTOMY WITH RADIOACTIVE SEED AND RIGHT SENTINEL LYMPH NODE MAPPING;  Surgeon: Harriette Bouillon, MD;  Location: [facility];  Service: General;  Laterality: Right;   CESAREAN SECTION     OPERATIVE ULTRASOUND Right 11/03/2018    Procedure: OPERATIVE ULTRASOUND;  Surgeon: Harriette Bouillon, MD;  Location: [facility];  Service: General;  Laterality: Right;   PORT-A-CATH REMOVAL N/A 12/09/2018   Procedure: PORT REMOVAL;  Surgeon: Harriette Bouillon, MD;  Location: [facility];  Service: General;  Laterality: N/A;   PORTACATH PLACEMENT Right 11/03/2018   Procedure: INSERTION PORT-A-CATH;  Surgeon: Harriette Bouillon, MD;  Location: [facility];  Service: General;  Laterality: Right;     Current Outpatient Medications on File Prior to Visit  Medication Sig Dispense Refill   Fremanezumab-vfrm (AJOVY) 225 MG/1.5ML SOAJ Inject 225 mg into the skin every 30 (thirty) days. Please run copay card: BIN# [id number] PCN# [id number] GRP# [id number] ID# [id number] 1.5 mL 11   NUVARING 0.12-0.015 MG/24HR vaginal ring Place 1 each vaginally every 30 (thirty) days.     pantoprazole (PROTONIX) 40 MG tablet Take 1 tablet (40 mg total) by mouth daily. 90 tablet 1   promethazine (PHENERGAN) 25 MG tablet      propranolol (INDERAL) 20 MG tablet Take 1 tablet (20 mg total) by mouth 2 (two) times daily as needed. Take for migraine and HTN during migraines 60 tablet 6   rizatriptan (MAXALT) 10 MG tablet Take 10 mg by mouth as needed for migraine. May repeat in 2 hours if needed     saccharomyces boulardii (FLORASTOR) 250 MG capsule Take 1 capsule (250 mg total) by mouth 2 (two) times daily. 180 capsule 3   sertraline (ZOLOFT) 100 MG tablet Take 1.5 tablets (150 mg total) by mouth daily. PATIENT NEEDS OFFICE VISIT FOR ADDITIONAL REFILLS 45 tablet 0   SUMAtriptan (TOSYMRA) 10 MG/ACT SOLN Place 1 spray into the nose  every hour as needed (max 3 doses per day). 6 each 11   topiramate (TOPAMAX) 100 MG tablet Take 100 mg by mouth daily.     Ubrogepant (UBRELVY) 100 MG TABS Take 1 tablet by mouth as needed.     botulinum toxin Type A (BOTOX) 200 units injection Provider to inject 155 units into the muscles of the head and neck every 12  weeks. Discard remainder. (Patient not taking: Reported on 04/21/2023) 1 each 3   No current facility-administered medications on file prior to visit.    No Known Allergies   DIAGNOSTIC DATA (LABS, IMAGING, TESTING) - I reviewed patient records, labs, notes, testing and imaging myself where available.  Lab Results  Component Value Date   WBC 7.7 10/30/2020   HGB 13.8 10/30/2020   HCT 41.7 10/30/2020   MCV 86.7 10/30/2020   PLT 235 10/30/2020      Component Value Date/Time   NA 139 10/30/2020 1927   K 3.6 10/30/2020 1927   CL 110 10/30/2020 1927   CO2 19 (L) 10/30/2020 1927   GLUCOSE 114 (H) 10/30/2020 1927   BUN 12 10/30/2020 1927   CREATININE 0.84 10/30/2020 1927   CREATININE 0.98 09/08/2019 0924   CALCIUM 8.1 (L) 10/30/2020 1927   PROT 7.1 09/08/2019 0924   ALBUMIN 3.3 (L) 09/08/2019 0924   AST 16 09/08/2019 0924   ALT 19 09/08/2019 0924   ALKPHOS 94 09/08/2019 0924   BILITOT 0.2 (L) 09/08/2019 0924   GFRNONAA >60 10/30/2020 1927   GFRNONAA >60 09/08/2019 0924   GFRAA >60 09/08/2019 0924   No results found for: "CHOL", "HDL", "LDLCALC", "LDLDIRECT", "TRIG", "CHOLHDL" No results found for: "HGBA1C" No results found for: "VITAMINB12" No results found for: "TSH"  PHYSICAL EXAM:  Today's Vitals   04/21/23 1457  BP: (!) 148/86  Pulse: 88  Weight: 229 lb (103.9 kg)  Height: 5\' 2"  (1.575 m)   Body mass index is 41.88 kg/m.   Wt Readings from Last 3 Encounters:  04/21/23 229 lb (103.9 kg)  03/20/23 228 lb 8 oz (103.6 kg)  02/09/23 230 lb 3.2 oz (104.4 kg)     Ht Readings from Last 3 Encounters:  04/21/23 5\' 2"  (1.575 m)  03/20/23 5\' 2"  (1.575 m)  02/09/23 5\' 2"  (1.575 m)      General: The patient is awake, alert and appears not in acute distress. The patient is well groomed. Head: Normocephalic, atraumatic. Neck is supple. Mallampati 3,  neck circumference:14.5  inches . Nasal airflow patent.   Retrognathia is not seen.  Dental status: biological,  overbite.  Cardiovascular:  Regular rate and cardiac rhythm by pulse,  without distended neck veins. Respiratory: Lungs are clear to auscultation.  Skin:  Without evidence of ankle edema, or rash. Trunk: The patient's posture is erect.   NEUROLOGIC EXAM: The patient is awake and alert, oriented to place and time.   Memory subjective described as intact.  Attention span & concentration ability appears normal.  Speech is fluent,  without  dysarthria, dysphonia or aphasia.  Mood and affect are appropriate.   Cranial nerves: no loss of smell or taste reported  Pupils are equal and briskly reactive to light. Funduscopic exam deferred. .  Extraocular movements in vertical and horizontal planes were intact and without nystagmus. No Diplopia. Visual fields by finger perimetry are intact. Hearing was intact to soft voice and finger rubbing.    Facial sensation intact to fine touch.  Facial motor strength is symmetric and tongue  and uvula move midline.  Neck ROM : rotation, tilt and flexion extension were normal for age and shoulder shrug was symmetrical.    Motor exam:  Symmetric bulk, tone and ROM.   Normal tone without cog wheeling, symmetric grip strength .   Sensory:  Fine touch, pinprick and vibration were tested  and  normal.  She reported d tingling in her fingers since sh e started TPM.  Proprioception tested in the upper extremities was normal.   Coordination: Rapid alternating movements in the fingers/hands were of normal speed.  The Finger-to-nose maneuver was intact without evidence of ataxia, dysmetria or tremor.   Gait and station: Patient could rise unassisted from a seated position, walked without assistive device.  Stance is of normal width/ base and the patient turned with 3 steps.  Toe and heel walk were deferred.  Deep tendon reflexes: in the  upper and lower extremities are symmetric and intact.  Babinski response was deferred .    ASSESSMENT AND PLAN 48 y.o. -  female  here with:    1) excessive daytime sleepiness as documented by an Epworth sleepiness scale of 15 out of 24 points.  There is also very high fatigue score at 50 out of 63 points.  The patient does not report very vivid dreams or nightmares she has no history of parasomnia, sleep paralysis or nightmare disorder.  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  Most shifts workers cannot sleep a regular sleep time in daytime but this has never been a problem for her she will sleep 8 hours in the daytime.  It would also not interfere with her nocturnal sleep if she took a multi hour nap.    3) in addition her husband has witnessed irregular breathing at night and loud snoring.  There is some risk factors for possible sleep apnea, including body mass index, the dental position and overbite, and a high-grade Mallampati.  So I want to first screen this patient for sleep apnea is the most common cause of excessive daytime sleepiness ,  it may also give [place] additional tools to treat her morning headaches.  Sleep-related headaches are very frequently related to nocturnal hypoxia.    Some patients will wake up with a sharp although this was a dull form of headache this patient has sharp cluster like stabbing sensation headaches.  These are more commonly seen in patients that are also known to have migraines.  And even that this patient is a very excessively daytime sleepy, she does not subscribe to power naps, she does not have vivid dreams or hypnagogic hypnopompic hallucinations, there is no history of cataplexy or sleep paralysis.  The suspicion for narcolepsy is therefore very low.    Plan : [facility] patient will need to undergo HST fro screening for apnea and hypoxemia    I plan to follow up either personally or through our NP within 4-6  months.   I would like to thank Irven Coe, MD and Anson Fret, Md [address] [place],  [place] [address] for allowing me to meet with and to take care of this pleasant patient.    After spending a total time of  43  minutes face to face and additional time for physical and neurologic examination, review of laboratory studies,  personal review of imaging studies, reports and results of other testing and review of referral  information / records as far as provided in visit,   Electronically signed by: Melvyn Novas, MD 04/21/2023 3:33 PM  [facility] and [facility] Sleep Board certified by The [employer] of Sleep Medicine and Diplomate of the [employer] of Sleep Medicine. Board certified In Neurology through the ABPN, Fellow of the [employer] of Neurology.

## 2023-04-21 NOTE — Patient Instructions (Addendum)
Hypersomnia Hypersomnia is a condition in which a person feels very tired during the day even though the person gets plenty of sleep at night. A person with this condition may take naps during the day and may find it very difficult to wake up from sleep. Hypersomnia may affect a person's ability to think, concentrate, drive, or remember things. What are the causes? The cause of this condition may not be known. Possible causes include: Taking certain medicines. Using drugs or alcohol. Sleep disorders, such as narcolepsy and sleep apnea. Injury to the head, brain, or spinal cord. Tumors. Certain medical conditions. These include: Depression. Diabetes. Gastroesophageal reflux disease (GERD). An underactive thyroid gland (hypothyroidism). What are the signs or symptoms? The main symptoms of hypersomnia include: Feeling very tired throughout the day, regardless of how much sleep you got the night before. Having trouble waking up. Others may find it difficult to wake you up when you are sleeping. Sleeping for longer and longer periods at a time. Taking naps throughout the day. Other symptoms may include: Feeling restless, anxious, or annoyed. Lacking energy. Having trouble with: Remembering. Speaking. Thinking. Loss of appetite. Seeing, hearing, tasting, smelling, or feeling things that are not real (hallucinations). How is this diagnosed? This condition may be diagnosed based on: Your symptoms and medical history. Your sleeping habits. Your health care provider may ask you to write down your sleeping habits in a daily sleep log, along with any symptoms you have. A series of tests that are done while you sleep (sleep study or polysomnogram). A test that measures how quickly you can fall asleep during the day (daytime nap study or multiple sleep latency test). How is this treated? This condition may be treated by: Following a regular sleep routine. Making lifestyle changes, such  as changing your eating habits, getting regular exercise, and avoiding alcohol or caffeinated beverages. Taking medicines to make you more alert (stimulants) during the day. Treating any underlying medical causes of hypersomnia. Follow these instructions at home: Sleep habits Stick to a routine that includes going to bed and waking up at the same times every day and night. Practice a relaxing bedtime routine. This may include reading, meditation, deep breathing, or taking a warm bath before going to sleep. Exercise regularly as told by your health care provider. However, avoid exercising in the hours right before bedtime. Keep your sleep environment at a cooler temperature, darkened, and quiet. Sleep with pillows and a mattress that are comfortable and supportive. Schedule short 20-minute naps for when you feel sleepiest during the day. Talk with your employer or teachers about your hypersomnia. If possible, adjust your schedule so that: You have a regular daytime work schedule. You can take a scheduled nap during the day. You do not have to work or be active at night. Do not eat a heavy meal for a few hours before bedtime. Eat your meals at about the same times every day. Safety  Do not drive or use machinery if you are sleepy. Ask your health care provider if it is safe for you to drive. Wear a life jacket when swimming or spending time near water. General instructions  Take over-the-counter and prescription medicines only as told by your health care provider. This includes supplements. Avoid drinking alcohol or caffeinated beverages. Keep a sleep log that will help your health care provider manage your condition. This may include information about: What time you go to bed each night. How often you wake up at night. How  many hours you sleep at night. How often and for how long you nap during the day. Any observations from others, such as leg movements during sleep, sleep walking, or  snoring. Keep all follow-up visits. This is important. Contact a health care provider if: You have new symptoms. Your symptoms get worse. Get help right away if: You have thoughts about hurting yourself or someone else. Get help right away if you feel like you may hurt yourself or others, or have thoughts about taking your own life. Go to your nearest emergency room or: Call 911. Call the National Suicide Prevention Lifeline at 501-036-0415 or 988. This is open 24 hours a day. Text the Crisis Text Line at 323-684-7584. Summary Hypersomnia refers to a condition in which you feel very tired during the day even though you get plenty of sleep at night. A person with this condition may take naps during the day and may find it very difficult to wake up from sleep. Hypersomnia may affect a person's ability to think, concentrate, drive, or remember things. Treatment may include a regular sleep routine and making some lifestyle changes. This information is not intended to replace advice given to you by your health care provider. Make sure you discuss any questions you have with your health care provider. Document Revised: 02/25/2021 Document Reviewed: 02/25/2021 Elsevier Patient Education  2024 Elsevier Inc.    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  There is also very high fatigue score at 50 out of 63 points.   The patient does not report very vivid dreams or nightmares she has no history of parasomnia, sleep paralysis or nightmare disorder.   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  Most shifts workers cannot sleep a regular sleep time in daytime but this has never been a problem for her she will sleep 8 hours in the daytime.  It would also not interfere with her nocturnal sleep if she  took a multi hour nap.       3) in addition her husband has witnessed irregular breathing at night and loud snoring.  There is some risk factors for possible sleep apnea, including body mass index, the dental position and overbite, and a high-grade Mallampati.   So I want to first screen this patient for sleep apnea is the most common cause of excessive daytime sleepiness ,   it may also give [place] additional tools to treat her morning headaches.  Sleep-related headaches are very frequently related to nocturnal hypoxia.     Some patients will wake up with a sharp although this was a dull form of headache this patient has sharp cluster like stabbing sensation headaches.  These are more commonly seen in patients that are also known to have migraines.   And even that this patient is a very excessively daytime sleepy, she does not subscribe to power naps, she does not have vivid dreams or hypnagogic hypnopompic hallucinations, there is no history of cataplexy or sleep paralysis.  The suspicion for narcolepsy is therefore very low.       Plan : [facility] patient will need to undergo HST fro screening for apnea and hypoxemia

## 2023-04-21 NOTE — Telephone Encounter (Signed)
Pt states Ajovy PA needed since December. Please do asap. Thank you!

## 2023-04-23 ENCOUNTER — Other Ambulatory Visit (HOSPITAL_COMMUNITY): Payer: Self-pay

## 2023-04-23 ENCOUNTER — Telehealth: Payer: Self-pay

## 2023-04-23 NOTE — Telephone Encounter (Signed)
PA request has been Submitted. New Encounter created for follow up. For additional info see Pharmacy Prior Auth telephone encounter from 04/23/2023.

## 2023-04-23 NOTE — Telephone Encounter (Signed)
Pharmacy Patient Advocate Encounter   Received notification from Physician's Office that prior authorization for AJOVY (fremanezumab-vfrm) injection 225MG /1.5ML auto-injectors is required/requested.   Insurance verification completed.   The patient is insured through [facility] .   Per test claim: PA required; PA submitted to above mentioned insurance via CoverMyMeds Key/confirmation #/EOC [facility] Status is pending

## 2023-04-29 ENCOUNTER — Ambulatory Visit: Payer: 59 | Admitting: Neurology

## 2023-04-29 DIAGNOSIS — G4719 Other hypersomnia: Secondary | ICD-10-CM

## 2023-04-29 DIAGNOSIS — R519 Headache, unspecified: Secondary | ICD-10-CM

## 2023-04-29 DIAGNOSIS — G43831 Menstrual migraine, intractable, with status migrainosus: Secondary | ICD-10-CM

## 2023-04-29 DIAGNOSIS — G4710 Hypersomnia, unspecified: Secondary | ICD-10-CM

## 2023-04-29 DIAGNOSIS — E661 Drug-induced obesity: Secondary | ICD-10-CM

## 2023-04-29 DIAGNOSIS — G4726 Circadian rhythm sleep disorder, shift work type: Secondary | ICD-10-CM

## 2023-04-29 DIAGNOSIS — R0683 Snoring: Secondary | ICD-10-CM

## 2023-05-07 ENCOUNTER — Encounter: Payer: Self-pay | Admitting: Neurology

## 2023-05-13 NOTE — Progress Notes (Signed)
[facility]  [name] 48 year old female Jan 12, 1976   HOME SLEEP TEST REPORT ( by Watch PAT)  MAIL OUT - Data available 05-13-2023 STUDY DATE:  05-06-2023    ORDERING CLINICIAN: Melvyn Novas, MD  REFERRING CLINICIAN: Naomie Dean, MD  PCP Irven Coe, MD    CLINICAL INFORMATION/HISTORY: I have the pleasure of meeing [name] on  04/21/23 a right-handed female with a possible sleep disorder.  She was referred through Dr Lucia Gaskins, MD , at [facility].  The patient currently works nights - 12 hours 4 days on and 4 days off- gets off work 6.30 AM.  She is a Agricultural engineer.  Caffeine intake in form of Coffee( at night - 1-3 cups) Soda( 1) Tea ( 1/d ) or energy drinks. The patient goes to bed at 7 AM after night shift  and continues to sleep for 7-8 hours, wakes rarely for bathroom breaks, and rises at 3 Pm,  goes to work at 6 Pm.   ROS : Fatigue, sleepiness , long sleep time, hypersomnia  morning headaches, with sharp  stabbing sensation  Her previous sleep study was a home sleep test through an Motley device, performed through [facility] on 08-22-2021.  At the time , her BMI was 42.8 ,her weight 238 pounds with a height of 62.5 inches.  The referring physician was Dr Sara Chu.    The home sleep test began at 9:47 PM and ended at 7:28 AM and the patient was monitored for a total of 581 minutes, 0 apneas were found but 7 hypopneas were observed. the oxygen nadir was 90%, the  average heart rate was 84 bpm, the home sleep test revealed mild snoring no significant apnea. Unfortunately, the Alice device cannot differentiate between recording time and actual sleep time.  As the patient reported this was an unusual night- there were some disturbances, she may have not slept enough for a valid test result.   Plan :  [employer] patient will need to undergo HST for screening for apnea and hypoxemia         Epworth sleepiness score:15/ 24 points /  FSS endorsed at 50/ 63 points.    BMI:42  kg/m   Neck Circumference: 14.5" , overbite .    FINDINGS:   Sleep Summary:   Total Recording Time (hours, min): 9 hours 59 minutes       Total Sleep Time (hours, min): 9 hours 2 minutes               Percent REM (%): 25%      Sleep latency was 70 minutes long and REM sleep latency 49 minutes long.  There were 39 minutes of wakefulness after sleep onset.                                  Respiratory Indices, following AASM criteria:   Calculated pAHI (per hour):      0.8/h                       REM pAHI: 0.5/h                                                NREM pAHI:    0.9/h  Positional AHI: This patient slept equal amounts of time in supine and in nonsupine position.  Supine sleep 272 minutes with an associated AHI of 0.9/h, followed by prone sleep 203 minutes with an associated AHI of 0.6/h, 50 minutes in left lateral sleep were associated with 0 AHI.  Snoring statistics show a mean volume of 41 dB less than 20% of total sleep time were associated with snoring.                                                 Oxygen Saturation Statistics:   Oxygen Saturation (%) Mean:     96%          Minimum oxygen saturation (%):     91%       O2 Saturation Range (%):     Between the nadir at 91% and a maximum of 98%                                  O2 Saturation (minutes) <89%:      No hypoxemia was noted, 0 minutes.  Pulse Rate Statistics:   Pulse Mean (bpm):    74 bpm             Pulse Range:     Between 62 and 97 bpm            IMPRESSION:  This HST confirms the presence of snoring but not of sleep apnea.  There was also no hypoxia noted which would have explained the described symptom of morning headaches.     RECOMMENDATION: This home sleep test did not detect sleep disordered breathing.  The patient clearly states that she is excessively daytime sleepy and felt very much fatigued, she describes morning headaches.  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  Sleep onset latency was not too short and the REM sleep latency was not short enough to indicate possible narcolepsy.  I would concentrate on weight loss, reducing the risk of idiopathic hypertension as a cause for headaches.   Concentrate on prevention of allergic rhinitis and sinusitis. Elevate the head of bed by 15 degrees, and keep up with hydration, eat healthy snacks at work, and reduce sodium intake. .   INTERPRETING PHYSICIAN:   Melvyn Novas, MD  [facility] Board certified by The [employer] of Sleep Medicine and Diplomate of the [employer] of Sleep Medicine. Board certified In Neurology through the ABPN, Fellow of the [employer] of Neurology.

## 2023-05-19 NOTE — Telephone Encounter (Signed)
Pharmacy Patient Advocate Encounter  Received notification from [facility] that Prior Authorization for AJOVY (fremanezumab-vfrm) injection 225MG /1.5ML auto-injectors has been DENIED.  Full denial letter will be uploaded to the media tab. See denial reason below.   PA #/Case ID/Reference #: PA Case ID #: [id number]

## 2023-05-20 ENCOUNTER — Telehealth: Payer: Self-pay | Admitting: Pharmacist

## 2023-05-20 NOTE — Telephone Encounter (Signed)
Will forward to the appeals Pharmacist for review.

## 2023-05-20 NOTE — Telephone Encounter (Signed)
Emgality denied.  From initial visit with Dr. Lucia Gaskins she came to [place] on emgality and still had headaches.  Can we appeal.

## 2023-05-20 NOTE — Telephone Encounter (Signed)
Appeal has been submitted for Ajovy. Will advise when response is received, please be advised that most companies may take 30 days to make a decision. Appeal letter and supporting documentation have been faxed to [phone] on 05/20/2023 @3 :46 pm.  Thank you, Dellie Burns, PharmD Clinical Pharmacist  [place]  Direct Dial: [phone]

## 2023-05-21 ENCOUNTER — Encounter: Payer: Self-pay | Admitting: Neurology

## 2023-05-21 DIAGNOSIS — E661 Drug-induced obesity: Secondary | ICD-10-CM | POA: Insufficient documentation

## 2023-05-21 NOTE — Procedures (Signed)
[facility]  [name] 48 year old female Jan 12, 1976   HOME SLEEP TEST REPORT ( by Watch PAT)  MAIL OUT - Data available 05-13-2023 STUDY DATE:  05-06-2023    ORDERING CLINICIAN: Melvyn Novas, MD  REFERRING CLINICIAN: Naomie Dean, MD  PCP Irven Coe, MD    CLINICAL INFORMATION/HISTORY: I have the pleasure of meeing [name] on  04/21/23 a right-handed female with a possible sleep disorder.  She was referred through Dr Lucia Gaskins, MD , at [facility].  The patient currently works nights - 12 hours 4 days on and 4 days off- gets off work 6.30 AM.  She is a Agricultural engineer.  Caffeine intake in form of Coffee( at night - 1-3 cups) Soda( 1) Tea ( 1/d ) or energy drinks. The patient goes to bed at 7 AM after night shift  and continues to sleep for 7-8 hours, wakes rarely for bathroom breaks, and rises at 3 Pm,  goes to work at 6 Pm.   ROS : Fatigue, sleepiness , long sleep time, hypersomnia  morning headaches, with sharp  stabbing sensation  Her previous sleep study was a home sleep test through an Motley device, performed through [facility] on 08-22-2021.  At the time , her BMI was 42.8 ,her weight 238 pounds with a height of 62.5 inches.  The referring physician was Dr Sara Chu.    The home sleep test began at 9:47 PM and ended at 7:28 AM and the patient was monitored for a total of 581 minutes, 0 apneas were found but 7 hypopneas were observed. the oxygen nadir was 90%, the  average heart rate was 84 bpm, the home sleep test revealed mild snoring no significant apnea. Unfortunately, the Alice device cannot differentiate between recording time and actual sleep time.  As the patient reported this was an unusual night- there were some disturbances, she may have not slept enough for a valid test result.   Plan :  [facility] patient will need to undergo HST for screening for apnea and hypoxemia         Epworth sleepiness score:15/ 24 points /  FSS endorsed at 50/ 63 points.    BMI:42  kg/m   Neck Circumference: 14.5" , overbite .    FINDINGS:   Sleep Summary:   Total Recording Time (hours, min): 9 hours 59 minutes       Total Sleep Time (hours, min): 9 hours 2 minutes               Percent REM (%): 25%      Sleep latency was 70 minutes long and REM sleep latency 49 minutes long.  There were 39 minutes of wakefulness after sleep onset.                                  Respiratory Indices, following AASM criteria:   Calculated pAHI (per hour):      0.8/h                       REM pAHI: 0.5/h                                                NREM pAHI:    0.9/h  Positional AHI: This patient slept equal amounts of time in supine and in nonsupine position.  Supine sleep 272 minutes with an associated AHI of 0.9/h, followed by prone sleep 203 minutes with an associated AHI of 0.6/h, 50 minutes in left lateral sleep were associated with 0 AHI.  Snoring statistics show a mean volume of 41 dB less than 20% of total sleep time were associated with snoring.                                                 Oxygen Saturation Statistics:   Oxygen Saturation (%) Mean:     96%          Minimum oxygen saturation (%):     91%       O2 Saturation Range (%):     Between the nadir at 91% and a maximum of 98%                                  O2 Saturation (minutes) <89%:      No hypoxemia was noted, 0 minutes.  Pulse Rate Statistics:   Pulse Mean (bpm):    74 bpm             Pulse Range:     Between 62 and 97 bpm            IMPRESSION:  This HST confirms the presence of snoring but not of sleep apnea.  There was also no hypoxia noted which would have explained the described symptom of morning headaches.     RECOMMENDATION: This home sleep test did not detect sleep disordered breathing.  The patient clearly states that she is excessively daytime sleepy and felt very much fatigued, she describes morning headaches.  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  Sleep onset latency was not too short and the REM sleep latency was not short enough to indicate possible narcolepsy.  I would concentrate on weight loss, reducing the risk of idiopathic hypertension as a cause for headaches.   Concentrate on prevention of allergic rhinitis and sinusitis. Elevate the head of bed by 15 degrees, and keep up with hydration, eat healthy snacks at work, and reduce sodium intake. .   INTERPRETING PHYSICIAN:   Melvyn Novas, MD  [facility] Board certified by The [employer] of Sleep Medicine and Diplomate of the [employer] of Sleep Medicine. Board certified In Neurology through the ABPN, Fellow of the [employer] Neu[employer].

## 2023-05-26 ENCOUNTER — Other Ambulatory Visit: Payer: Self-pay | Admitting: Neurology

## 2023-05-26 DIAGNOSIS — G43009 Migraine without aura, not intractable, without status migrainosus: Secondary | ICD-10-CM

## 2023-05-27 ENCOUNTER — Encounter: Payer: Self-pay | Admitting: Neurology

## 2023-06-11 ENCOUNTER — Other Ambulatory Visit (HOSPITAL_COMMUNITY): Payer: Self-pay

## 2023-06-12 ENCOUNTER — Other Ambulatory Visit: Payer: Self-pay | Admitting: *Deleted

## 2023-06-12 ENCOUNTER — Encounter: Payer: Self-pay | Admitting: Hematology and Oncology

## 2023-06-12 DIAGNOSIS — C50511 Malignant neoplasm of lower-outer quadrant of right female breast: Secondary | ICD-10-CM

## 2023-06-12 NOTE — Progress Notes (Signed)
 [employer] annual renewal orders placed per MD request.

## 2023-06-18 ENCOUNTER — Other Ambulatory Visit (HOSPITAL_COMMUNITY): Payer: Self-pay

## 2023-06-18 NOTE — Telephone Encounter (Signed)
Appeal denied by insurance.

## 2023-07-06 NOTE — Telephone Encounter (Signed)
 LVM and sent mychart msg to schedule botox appointment with NP

## 2023-07-06 NOTE — Telephone Encounter (Signed)
 Patient left a voice mail asking for a call back to schedule her first botox appointment please

## 2023-07-07 ENCOUNTER — Telehealth (INDEPENDENT_AMBULATORY_CARE_PROVIDER_SITE_OTHER): Payer: 59 | Admitting: Neurology

## 2023-07-07 DIAGNOSIS — G43709 Chronic migraine without aura, not intractable, without status migrainosus: Secondary | ICD-10-CM | POA: Diagnosis not present

## 2023-07-07 NOTE — Progress Notes (Unsigned)
 [facility]    Provider:  Dr Lucia Gaskins Requesting Provider: Marcelle Overlie, MD Primary Care Provider:  Irven Coe, MD  Virtual Visit via Video Note  I connected with [name] on 07/07/2023 at  3:00 PM EDT by a video enabled telemedicine application and verified that I am speaking with the correct person using two identifiers.  Location: Patient: home Provider: office   I discussed the limitations of evaluation and management by telemedicine and the availability of in person appointments. The patient expressed understanding and agreed to proceed.   Follow Up Instructions:    I discussed the assessment and treatment plan with the patient. The patient was provided an opportunity to ask questions and all were answered. The patient agreed with the plan and demonstrated an understanding of the instructions.   The patient was advised to call back or seek an in-person evaluation if the symptoms worsen or if the condition fails to improve as anticipated.  I provided over 20 minutes of non-face-to-face time during this encounter.   Anson Fret, MD   CC:  Migraines  07/08/2023: Has an appointment in May for botox. We discussed getting her in sooner sent note to staff and we can try and get her specialty botox here: Lawanna Kobus do you mind placing [name] Female, 48 y.o., [birth year] for botox april 14th at 430 then I can transition to NP.  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  She would like to decrease topiramate so take a 1/2 tablet and if migraines get worse go up again and wait for botox. Otherwise can stop it entirely after botox.   No other focal neurologic deficits, associated symptoms, inciting events or modifiable factors.  Patient complains of symptoms per HPI as well as the following symptoms: none . Pertinent negatives and positives per HPI. All others negative    04/29/2023 Dr. Vickey Huger:  The home sleep test began at 9:47 PM and ended at 7:28 AM and the patient was monitored for a total of 581 minutes, 0 apneas were found but 7 hypopneas were observed. the oxygen nadir was 90%, the  average heart rate was 84 bpm, the home sleep test revealed mild snoring no significant apnea.  Calculated pAHI (per hour):      0.8/h                       REM pAHI: 0.5/h                                                NREM pAHI:    0.9/h                          Positional AHI: This patient slept equal amounts of time in supine and in nonsupine position.  Supine sleep 272 minutes with an associated AHI of 0.9/h, followed by prone sleep 203 minutes with an associated AHI of 0.6/h, 50 minutes in left lateral sleep were associated with 0 AHI.   Snoring statistics show a mean volume of 41 dB less than 20% of total sleep time were associated with snoring.  Oxygen Saturation Statistics:   Oxygen Saturation (%) Mean:     96%          Minimum oxygen saturation (%):     91%       O2 Saturation Range (%):     Between the nadir at 91% and a maximum of 98%                                  O2 Saturation (minutes) <89%:      No hypoxemia was noted, 0 minutes.   RECOMMENDATION: This home sleep test did not detect sleep disordered breathing.  The patient clearly states that she is excessively daytime sleepy and felt very much fatigued, she describes morning headaches.  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   Sleep onset latency was not too short and the REM sleep latency was not short enough to indicate possible narcolepsy.   I would concentrate on weight loss, reducing the risk of idiopathic hypertension as a cause for headaches.   Concentrate on prevention of allergic rhinitis and sinusitis. Elevate the head of bed by 15 degrees, and keep up with hydration, eat healthy snacks at work, and  reduce sodium intake.  HPI 02/09/2023:  [name] is a 48 y.o. female here as requested by Marcelle Overlie, MD for Migraines. 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  Her blood pressure spikes during a migraine. The first time it happened she turned to the left and her face was numb. Her BP was 220 systolic. Her bp is usually 120/80 except when the migraines are severe can be very random and no rhyme or reason. She went to urgent care and she went to [facility] she had a scan and was told her BP increase was possibly part of a migraine aura. This was in 2022. She can feel the tingling and numbness on the left side of the face and she takes her BP and she will start getting a headache. She was pre-treated with tylenol and the migraine was relived and the BP improved with treating of migraine and drinking fluids. Started with Aimovig and she took it for years then she went without it due to insurance and then started the Emgality in the spring/summer and she has been on that less than a year. Prior to emgality she would have 15 migraine days a month and > 8 migraine days mod to severe. She wakes with migraines. She takes ubrelvy/maxalt/phenergan. On Emgality she still has migraines.  Weather is huge trigger, stress is a trigger.   Reviewed notes, labs and imaging from outside physicians, which showed :  Medications Tried to Date and Ineffective: acetaminophen, zolmitriptan, rizatriptan, sumatriptan, Zofran/ondansetron, Celebrex, prednisone, venlafaxine/Effexor, Zoloft, Migrelief, topiramate, ubrelvy, emagality, prpramolol c/I due to hypotention  11/20/2022 ct head :  CT HEAD WITHOUT CONTRAST   TECHNIQUE: Contiguous axial images were obtained from the base of the skull through the vertex without intravenous contrast.   COMPARISON:  None.   FINDINGS: Brain: The brain shows a normal appearance without evidence of malformation, atrophy, old or acute small or large vessel infarction, mass lesion, hemorrhage, hydrocephalus or extra-axial collection.   Vascular: No hyperdense vessel. No evidence of atherosclerotic calcification.   Skull: Normal.  No traumatic finding.  No focal bone lesion.   Sinuses/Orbits: Sinuses are clear. Orbits appear normal. Mastoids are clear.   Other: None significant     Latest Ref Rng & Units 10/30/2020    7:27 PM 09/08/2019    9:24 AM 11/01/2018   11:30 AM  CMP  Glucose 70 - 99 mg/dL 161  98  096   BUN 6 - 20 mg/dL 12  10  10    Creatinine 0.44 - 1.00 mg/dL 0.45  4.09  8.11   Sodium 135 - 145 mmol/L 139  139  139   Potassium 3.5 - 5.1 mmol/L 3.6  4.2  3.7   Chloride 98 - 111 mmol/L 110  110  109   CO2 22 - 32 mmol/L 19  19  20    Calcium 8.9 - 10.3 mg/dL 8.1  9.3  9.0   Total Protein 6.5 - 8.1 g/dL  7.1  6.7   Total Bilirubin 0.3 - 1.2 mg/dL  0.2  0.2   Alkaline Phos 38 - 126 U/L  94  86   AST 15 - 41 U/L  16  17   ALT 0 - 44 U/L  19  15       Latest Ref Rng & Units 10/30/2020    7:27 PM 11/01/2018   11:30 AM  CBC  WBC 4.0 - 10.5 K/uL 7.7  7.7   Hemoglobin 12.0 - 15.0 g/dL 91.4  78.2   Hematocrit 36.0 - 46.0 % 41.7  47.4   Platelets 150 - 400 K/uL 235  248      Review of Systems: Patient complains of symptoms per HPI as well  as the following symptoms anxiety. Pertinent negatives and positives per HPI. All others negative.   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  Other Topics Concern   Not on file  Social History Narrative   Lives husband    Pt works    Social Drivers of Corporate investment banker Strain: Low Risk  (04/02/2022)   Received from [employer], [facility]   Overall Financial Resource Strain (CARDIA)    Difficulty of Paying Living Expenses: Not hard at all  Food Insecurity: No Food Insecurity (04/02/2022)   Received from [facility], [facility]   Hunger Vital Sign    Worried About Running Out of Food in the Last Year: Never true    Ran Out of Food in the Last Year: Never true  Transportation Needs: No Transportation Needs (04/02/2022)   Received from [facility], [facility]   [facility] - Transportation    Lack of Transportation (Medical): No    Lack of Transportation (Non-Medical): No  Physical Activity: Unknown (04/02/2022)   Received from [facility]   Exercise Vital Sign    Days of Exercise per Week: 0 days    Minutes of Exercise per Session: Not on file  Recent Concern: Physical Activity - Inactive (04/02/2022)   Received from [facility], [facility]   Exercise Vital Sign    Days of Exercise per Week: 0 days    Minutes of Exercise per Session: 0 min  Stress: No Stress Concern Present (04/02/2022)   Received from [facility], [facility]  [facility] - Occupational Stress Questionnaire    Feeling of Stress : Only a little  Social Connections: Socially Integrated (04/02/2022)   Received from [facility], [facility]   Social Network    How would you rate your social network (family, work, friends)?: Good participation with social networks  Intimate Partner Violence: Not At Risk (04/02/2022)   Received from [facility], [facility]   HITS    Over the last 12 months how often did your partner physically hurt you?: Never    Over the last 12 months how often did your partner insult you or talk down to you?: Never    Over the last 12 months how often did your partner threaten you with physical harm?: Never    Over the last 12 months how often did your partner scream or curse at you?: Never    Family History  Problem Relation Age of Onset   Hypertension Mother    Migraines Mother    Hypertension Father    Migraines Sister    Melanoma Maternal Aunt 45       sun exposure   Migraines Paternal Aunt    Heart failure Maternal Grandmother    Emphysema Maternal Grandmother    Migraines Maternal Grandmother    Diabetes Maternal Grandfather    Parkinson's disease Paternal Grandmother    Heart disease Paternal Grandfather    Prostate cancer Paternal Grandfather 60       not metastatic   Cervical cancer Maternal Great-grandmother    Breast cancer Neg Hx    Sleep apnea Neg Hx     Past Medical History:  Diagnosis Date   Allergy    Breast cancer (HCC) 08/2018   Right breast cancer   Depression    Family history of cervical cancer    Family history of melanoma    Family history of prostate cancer    GERD (gastroesophageal reflux disease)    Migraine    Personal history of radiation therapy     Patient Active Problem List   Diagnosis Date Noted   Chronic migraine without aura without status migrainosus, not intractable 07/08/2023   Class 3 drug-induced obesity with serious comorbidity and body mass index (BMI) of 40.0 to  44.9 in adult [facility]) 05/21/2023   Shifting sleep-work schedule 04/21/2023   Snoring 04/21/2023   Excessive daytime sleepiness 04/21/2023   Sleep related headaches 04/21/2023   Intractable menstrual migraine with status migrainosus 04/21/2023   Genetic testing 10/13/2018   Family history of melanoma    Family history of prostate cancer    Family history of cervical cancer    Malignant neoplasm of lower-outer quadrant of right breast of female, estrogen receptor negative (HCC) 10/06/2018  Obesity, Class II, BMI 35-39.9 02/02/2018   Elevated LDL cholesterol level 01/05/2017   Dermatitis 01/31/2016   Migraine without aura 04/21/2011   Esophageal reflux 04/21/2011   Depression with anxiety 04/21/2011   Major depressive disorder, single episode 04/21/2011    Past Surgical History:  Procedure Laterality Date   ADENOIDECTOMY     APPENDECTOMY     BREAST LUMPECTOMY     BREAST LUMPECTOMY WITH RADIOACTIVE SEED AND SENTINEL LYMPH NODE BIOPSY Right 11/03/2018   Procedure: RIGHT BREAST LUMPECTOMY WITH RADIOACTIVE SEED AND RIGHT SENTINEL LYMPH NODE MAPPING;  Surgeon: Harriette Bouillon, MD;  Location: [facility];  Service: General;  Laterality: Right;   CESAREAN SECTION     OPERATIVE ULTRASOUND Right 11/03/2018   Procedure: OPERATIVE ULTRASOUND;  Surgeon: Harriette Bouillon, MD;  Location: [facility];  Service: General;  Laterality: Right;   PORT-A-CATH REMOVAL N/A 12/09/2018   Procedure: PORT REMOVAL;  Surgeon: Harriette Bouillon, MD;  Location: [facility];  Service: General;  Laterality: N/A;   PORTACATH PLACEMENT Right 11/03/2018   Procedure: INSERTION PORT-A-CATH;  Surgeon: Harriette Bouillon, MD;  Location: [facility];  Service: General;  Laterality: Right;    Current Outpatient Medications  Medication Sig Dispense Refill   botulinum toxin Type A (BOTOX) 200 units injection Provider to inject 155 units into the muscles of the head and neck every 12  weeks. Discard remainder. (Patient not taking: Reported on 04/21/2023) 1 each 3   Fremanezumab-vfrm (AJOVY) 225 MG/1.5ML SOAJ Inject 225 mg into the skin every 30 (thirty) days. Please run copay card: BIN# [id number] PCN# [id number] GRP# [id number] ID# [id number] 1.5 mL 11   NUVARING 0.12-0.015 MG/24HR vaginal ring Place 1 each vaginally every 30 (thirty) days.     pantoprazole (PROTONIX) 40 MG tablet Take 1 tablet (40 mg total) by mouth daily. 90 tablet 1   promethazine (PHENERGAN) 25 MG tablet TAKE 1 TABLET EVERY 8 HOURS AS NEEDED FOR NAUSEA. 40 tablet 2   propranolol (INDERAL) 20 MG tablet Take 1 tablet (20 mg total) by mouth 2 (two) times daily as needed. Take for migraine and HTN during migraines 60 tablet 6   rizatriptan (MAXALT) 10 MG tablet Take 10 mg by mouth as needed for migraine. May repeat in 2 hours if needed     rizatriptan (MAXALT-MLT) 10 MG disintegrating tablet TAKE 1 TABLET AS NEEDED FOR MIGRAINE. MAY REPEAT IN 2 HOURS IF NEEDED. MAX OF 3/24 HOURS. 12 tablet 2   saccharomyces boulardii (FLORASTOR) 250 MG capsule Take 1 capsule (250 mg total) by mouth 2 (two) times daily. 180 capsule 3   sertraline (ZOLOFT) 100 MG tablet Take 1.5 tablets (150 mg total) by mouth daily. PATIENT NEEDS OFFICE VISIT FOR ADDITIONAL REFILLS 45 tablet 0   SUMAtriptan (TOSYMRA) 10 MG/ACT SOLN Place 1 spray into the nose every hour as needed (max 3 doses per day). 6 each 11   topiramate (TOPAMAX) 100 MG tablet Take one tablet (100 mg dose) by mouth daily. 90 tablet 2   Ubrogepant (UBRELVY) 100 MG TABS Take 1 tablet by mouth as needed.     No current facility-administered medications for this visit.    Allergies as of 07/07/2023   (No Known Allergies)    Vitals: There were no vitals taken for this visit. Last Weight:  Wt Readings from Last 1 Encounters:  04/21/23 229 lb (103.9 kg)   Last Height:   Ht Readings from Last 1 Encounters:  04/21/23 5\' 2"  (1.575  m)     Physical exam: Exam: Gen: NAD, conversant       CV: No palpitations or chest pain or SOB. VS: Breathing at a normal rate. Not febrile. Eyes: Conjunctivae clear without exudates or hemorrhage  Neuro: Detailed Neurologic Exam  Speech:    Speech is normal; fluent and spontaneous with normal comprehension.  Cognition:    The patient is oriented to person, place, and time;     recent and remote memory intact;     language fluent;     normal attention, concentration, fund of knowledge Cranial Nerves:    The pupils are equal, round, and reactive to light. Visual fields are full Extraocular movements are intact.  The face is symmetric with normal sensation. The palate elevates in the midline. Hearing intact. Voice is normal. Shoulder shrug is normal. The tongue has normal motion without fasciculations.   Coordination: normal  Gait:    No abnormalities noted or reported  Motor Observation:   no involuntary movements noted. Tone:    Appears normal  Posture:    Posture is normal. normal erect    Strength:    Strength is anti-gravity and symmetric in the upper and lower limbs.      Sensation: intact to LT, no reports of numbness or tingling or paresthesias         Assessment/Plan:  patient with chronic migraines.   Pending botox for migraines Can decrease topiramate to 50mg  and then stop after botox. Go back to 100mg  if migraines worsen  Cc: Marcelle Overlie, MD,  Irven Coe, MD  Naomie Dean, MD  [facility] [address] [place], [place] [address]  Phone [phone] Fax [phone]

## 2023-07-07 NOTE — Telephone Encounter (Signed)
 I called the set up the delivery with [facility] and was told by Toni Amend in the PA department that her insurance doesn't cover SP and was transferred to [employer]. Who said that isn't true but instead we went ahead and submitted a new PA for BUY AND BILL. Auth: [id number] exp. 07/07/23-07/06/24 and the old Berkley Harvey is no longer valid.  Ref: Milas Kocher 07/07/23 2:57PM central time

## 2023-07-08 ENCOUNTER — Encounter: Payer: Self-pay | Admitting: Neurology

## 2023-07-08 DIAGNOSIS — G43709 Chronic migraine without aura, not intractable, without status migrainosus: Secondary | ICD-10-CM | POA: Insufficient documentation

## 2023-07-13 ENCOUNTER — Ambulatory Visit: Admitting: Neurology

## 2023-07-13 DIAGNOSIS — G43709 Chronic migraine without aura, not intractable, without status migrainosus: Secondary | ICD-10-CM | POA: Diagnosis not present

## 2023-07-13 MED ORDER — ONABOTULINUMTOXINA 200 UNITS IJ SOLR
155.0000 [IU] | Freq: Once | INTRAMUSCULAR | Status: AC
  Administered 2023-07-13: 155 [IU] via INTRAMUSCULAR

## 2023-07-13 NOTE — Progress Notes (Signed)
 Consent Form Botulism Toxin Injection For Chronic Migraine  07/13/2023: First botox     Reviewed orally with patient, additionally signature is on file:  Botulism toxin has been approved by the Federal drug administration for treatment of chronic migraine. Botulism toxin does not cure chronic migraine and it may not be effective in some patients.  The administration of botulism toxin is accomplished by injecting a small amount of toxin into the muscles of the neck and head. Dosage must be titrated for each individual. Any benefits resulting from botulism toxin tend to wear off after 3 months with a repeat injection required if benefit is to be maintained. Injections are usually done every 3-4 months with maximum effect peak achieved by about 2 or 3 weeks. Botulism toxin is expensive and you should be sure of what costs you will incur resulting from the injection.  The side effects of botulism toxin use for chronic migraine may include:   -Transient, and usually mild, facial weakness with facial injections  -Transient, and usually mild, head or neck weakness with head/neck injections  -Reduction or loss of forehead facial animation due to forehead muscle weakness  -Eyelid drooping  -Dry eye  -Pain at the site of injection or bruising at the site of injection  -Double vision  -Potential unknown long term risks  Contraindications: You should not have Botox if you are pregnant, nursing, allergic to albumin, have an infection, skin condition, or muscle weakness at the site of the injection, or have myasthenia gravis, Lambert-Eaton syndrome, or ALS.  It is also possible that as with any injection, there may be an allergic reaction or no effect from the medication. Reduced effectiveness after repeated injections is sometimes seen and rarely infection at the injection site may occur. All care will be taken to prevent these side effects. If therapy is given over a long time, atrophy and wasting in  the muscle injected may occur. Occasionally the patient's become refractory to treatment because they develop antibodies to the toxin. In this event, therapy needs to be modified.  I have read the above information and consent to the administration of botulism toxin.    BOTOX PROCEDURE NOTE FOR MIGRAINE HEADACHE    Contraindications and precautions discussed with patient(above). Aseptic procedure was observed and patient tolerated procedure. Procedure performed by Dr. Criselda Dolly  The condition has existed for more than 6 months, and pt does not have a diagnosis of ALS, Myasthenia Gravis or Lambert-Eaton Syndrome.  Risks and benefits of injections discussed and pt agrees to proceed with the procedure.  Written consent obtained  These injections are medically necessary. Pt  receives good benefits from these injections. These injections do not cause sedations or hallucinations which the oral therapies may cause.  Description of procedure:  The patient was placed in a sitting position. The standard protocol was used for Botox as follows, with 5 units of Botox injected at each site:   -Procerus muscle, midline injection  -Corrugator muscle, bilateral injection  -Frontalis muscle, bilateral injection, with 2 sites each side, medial injection was performed in the upper one third of the frontalis muscle, in the region vertical from the medial inferior edge of the superior orbital rim. The lateral injection was again in the upper one third of the forehead vertically above the lateral limbus of the cornea, 1.5 cm lateral to the medial injection site.  -Temporalis muscle injection, 4 sites, bilaterally. The first injection was 3 cm above the tragus of the ear, second injection site  was 1.5 cm to 3 cm up from the first injection site in line with the tragus of the ear. The third injection site was 1.5-3 cm forward between the first 2 injection sites. The fourth injection site was 1.5 cm posterior to  the second injection site.   -Occipitalis muscle injection, 3 sites, bilaterally. The first injection was done one half way between the occipital protuberance and the tip of the mastoid process behind the ear. The second injection site was done lateral and superior to the first, 1 fingerbreadth from the first injection. The third injection site was 1 fingerbreadth superiorly and medially from the first injection site.  -Cervical paraspinal muscle injection, 2 sites, bilateral knee first injection site was 1 cm from the midline of the cervical spine, 3 cm inferior to the lower border of the occipital protuberance. The second injection site was 1.5 cm superiorly and laterally to the first injection site.  -Trapezius muscle injection was performed at 3 sites, bilaterally. The first injection site was in the upper trapezius muscle halfway between the inflection point of the neck, and the acromion. The second injection site was one half way between the acromion and the first injection site. The third injection was done between the first injection site and the inflection point of the neck.   Will return for repeat injection in 3 months.   200 units of Botox was used, 45Unot injected was wasted. The patient tolerated the procedure well, there were no complications of the above procedure.

## 2023-07-13 NOTE — Progress Notes (Signed)
 Botox- 200 units x 1 vial Lot: [id number] Expiration: 06/2025 NDC: 2952-8413-24  Bacteriostatic 0.9% Sodium Chloride- * mL  Lot: [id number] Expiration: 01/30/2024 NDC: 2536-6440-34  Dx: V42.595 B/B Witnessed by Leeann Must, RN

## 2023-07-14 ENCOUNTER — Telehealth: Payer: Self-pay | Admitting: Neurology

## 2023-07-14 NOTE — Telephone Encounter (Signed)
 Angel @[facility] has called to schedule delivery of Botox: details: signature required, quantity of 1, SDV, 84 day supply, delivery date 44-17

## 2023-08-04 LAB — SIGNATERA
SIGNATERA MTM READOUT: 0 MTM/ml
SIGNATERA TEST RESULT: NEGATIVE

## 2023-08-19 ENCOUNTER — Ambulatory Visit: Admitting: Adult Health

## 2023-09-15 NOTE — Telephone Encounter (Signed)
 Since [facility] ended up filling Botox  last time, I submitted another PA with them as the servicing provider and received approval.   Auth#: [id number] (09/15/23-09/14/24)

## 2023-09-17 ENCOUNTER — Other Ambulatory Visit: Payer: Self-pay | Admitting: Hematology and Oncology

## 2023-09-17 DIAGNOSIS — Z1231 Encounter for screening mammogram for malignant neoplasm of breast: Secondary | ICD-10-CM

## 2023-09-25 ENCOUNTER — Ambulatory Visit
Admission: RE | Admit: 2023-09-25 | Discharge: 2023-09-25 | Disposition: A | Discharge status: Home / Self Care | Source: Ambulatory Visit | Attending: Hematology and Oncology

## 2023-09-25 DIAGNOSIS — Z1231 Encounter for screening mammogram for malignant neoplasm of breast: Secondary | ICD-10-CM

## 2023-10-13 NOTE — Progress Notes (Deleted)
 10/13/23 ALL: [name] returns for Botox . First procedure with Dr Ines 06/2023.     Consent Form Botulism Toxin Injection For Chronic Migraine    Reviewed orally with patient, additionally signature is on file:  Botulism toxin has been approved by the Federal drug administration for treatment of chronic migraine. Botulism toxin does not cure chronic migraine and it may not be effective in some patients.  The administration of botulism toxin is accomplished by injecting a small amount of toxin into the muscles of the neck and head. Dosage must be titrated for each individual. Any benefits resulting from botulism toxin tend to wear off after 3 months with a repeat injection required if benefit is to be maintained. Injections are usually done every 3-4 months with maximum effect peak achieved by about 2 or 3 weeks. Botulism toxin is expensive and you should be sure of what costs you will incur resulting from the injection.  The side effects of botulism toxin use for chronic migraine may include:   -Transient, and usually mild, facial weakness with facial injections  -Transient, and usually mild, head or neck weakness with head/neck injections  -Reduction or loss of forehead facial animation due to forehead muscle weakness  -Eyelid drooping  -Dry eye  -Pain at the site of injection or bruising at the site of injection  -Double vision  -Potential unknown long term risks   Contraindications: You should not have Botox  if you are pregnant, nursing, allergic to albumin, have an infection, skin condition, or muscle weakness at the site of the injection, or have myasthenia gravis, Lambert-Eaton syndrome, or ALS.  It is also possible that as with any injection, there may be an allergic reaction or no effect from the medication. Reduced effectiveness after repeated injections is sometimes seen and rarely infection at the injection site may occur. All care will be taken to prevent these side  effects. If therapy is given over a long time, atrophy and wasting in the muscle injected may occur. Occasionally the patient's become refractory to treatment because they develop antibodies to the toxin. In this event, therapy needs to be modified.  I have read the above information and consent to the administration of botulism toxin.    BOTOX  PROCEDURE NOTE FOR MIGRAINE HEADACHE  Contraindications and precautions discussed with patient(above). Aseptic procedure was observed and patient tolerated procedure. Procedure performed by Greig Forbes, FNP-C.   The condition has existed for more than 6 months, and pt does not have a diagnosis of ALS, Myasthenia Gravis or Lambert-Eaton Syndrome.  Risks and benefits of injections discussed and pt agrees to proceed with the procedure.  Written consent obtained  These injections are medically necessary. Pt  receives good benefits from these injections. These injections do not cause sedations or hallucinations which the oral therapies may cause.   Description of procedure:  The patient was placed in a sitting position. The standard protocol was used for Botox  as follows, with 5 units of Botox  injected at each site:  -Procerus muscle, midline injection  -Corrugator muscle, bilateral injection  -Frontalis muscle, bilateral injection, with 2 sites each side, medial injection was performed in the upper one third of the frontalis muscle, in the region vertical from the medial inferior edge of the superior orbital rim. The lateral injection was again in the upper one third of the forehead vertically above the lateral limbus of the cornea, 1.5 cm lateral to the medial injection site.  -Temporalis muscle injection, 4 sites, bilaterally. The first  injection was 3 cm above the tragus of the ear, second injection site was 1.5 cm to 3 cm up from the first injection site in line with the tragus of the ear. The third injection site was 1.5-3 cm forward between the first  2 injection sites. The fourth injection site was 1.5 cm posterior to the second injection site. 5th site laterally in the temporalis  muscleat the level of the outer canthus.  -Occipitalis muscle injection, 3 sites, bilaterally. The first injection was done one half way between the occipital protuberance and the tip of the mastoid process behind the ear. The second injection site was done lateral and superior to the first, 1 fingerbreadth from the first injection. The third injection site was 1 fingerbreadth superiorly and medially from the first injection site.  -Cervical paraspinal muscle injection, 2 sites, bilaterally. The first injection site was 1 cm from the midline of the cervical spine, 3 cm inferior to the lower border of the occipital protuberance. The second injection site was 1.5 cm superiorly and laterally to the first injection site.  -Trapezius muscle injection was performed at 3 sites, bilaterally. The first injection site was in the upper trapezius muscle halfway between the inflection point of the neck, and the acromion. The second injection site was one half way between the acromion and the first injection site. The third injection was done between the first injection site and the inflection point of the neck.   Will return for repeat injection in 3 months.   A total of 200 units of Botox  was prepared, 155 units of Botox  was injected as documented above, any Botox  not injected was wasted. The patient tolerated the procedure well, there were no complications of the above procedure.

## 2023-10-14 ENCOUNTER — Ambulatory Visit: Admitting: Family Medicine

## 2023-10-14 ENCOUNTER — Encounter: Payer: Self-pay | Admitting: Family Medicine

## 2023-10-14 DIAGNOSIS — G43709 Chronic migraine without aura, not intractable, without status migrainosus: Secondary | ICD-10-CM

## 2023-10-15 ENCOUNTER — Ambulatory Visit: Admitting: Family Medicine

## 2023-10-15 ENCOUNTER — Encounter: Payer: Self-pay | Admitting: Family Medicine

## 2023-10-15 VITALS — BP 152/106 | HR 116 | Resp 98

## 2023-10-15 DIAGNOSIS — G43709 Chronic migraine without aura, not intractable, without status migrainosus: Secondary | ICD-10-CM | POA: Diagnosis not present

## 2023-10-15 MED ORDER — ONABOTULINUMTOXINA 200 UNITS IJ SOLR
155.0000 [IU] | Freq: Once | INTRAMUSCULAR | Status: AC
  Administered 2023-10-15: 155 [IU] via INTRAMUSCULAR

## 2023-10-15 NOTE — Progress Notes (Signed)
 Botox -200U x 1vial Lot: [id number] Expiration:02/2026 NDC: 0023-3921-02  Bacteriostatic 0.9% Sodium Chloride - 4mL total Lot: [id number] Expiration: 01/28/2025 NDC: 9590-8033-97  Specialty pharmacy Dx: H56.290  Witnessed by: Briant RAMAN, CMA

## 2023-10-15 NOTE — Progress Notes (Signed)
 10/15/23 ALL: [name] returns for Botox . First procedure with Dr Ines 06/2023. She reports improvement in headaches for about 1-2 months. She has reduced topiramate to 50mg  daily. She uses either rizatriptan or Tosymra  with Ubrelvy  for abortive therapy about 4-5 times a month. Only takes propranolol  as needed when BP is elevated due to migraines.     Consent Form Botulism Toxin Injection For Chronic Migraine    Reviewed orally with patient, additionally signature is on file:  Botulism toxin has been approved by the Federal drug administration for treatment of chronic migraine. Botulism toxin does not cure chronic migraine and it may not be effective in some patients.  The administration of botulism toxin is accomplished by injecting a small amount of toxin into the muscles of the neck and head. Dosage must be titrated for each individual. Any benefits resulting from botulism toxin tend to wear off after 3 months with a repeat injection required if benefit is to be maintained. Injections are usually done every 3-4 months with maximum effect peak achieved by about 2 or 3 weeks. Botulism toxin is expensive and you should be sure of what costs you will incur resulting from the injection.  The side effects of botulism toxin use for chronic migraine may include:   -Transient, and usually mild, facial weakness with facial injections  -Transient, and usually mild, head or neck weakness with head/neck injections  -Reduction or loss of forehead facial animation due to forehead muscle weakness  -Eyelid drooping  -Dry eye  -Pain at the site of injection or bruising at the site of injection  -Double vision  -Potential unknown long term risks   Contraindications: You should not have Botox  if you are pregnant, nursing, allergic to albumin, have an infection, skin condition, or muscle weakness at the site of the injection, or have myasthenia gravis, Lambert-Eaton syndrome, or ALS.  It is also  possible that as with any injection, there may be an allergic reaction or no effect from the medication. Reduced effectiveness after repeated injections is sometimes seen and rarely infection at the injection site may occur. All care will be taken to prevent these side effects. If therapy is given over a long time, atrophy and wasting in the muscle injected may occur. Occasionally the patient's become refractory to treatment because they develop antibodies to the toxin. In this event, therapy needs to be modified.  I have read the above information and consent to the administration of botulism toxin.    BOTOX  PROCEDURE NOTE FOR MIGRAINE HEADACHE  Contraindications and precautions discussed with patient(above). Aseptic procedure was observed and patient tolerated procedure. Procedure performed by Greig Forbes, FNP-C.   The condition has existed for more than 6 months, and pt does not have a diagnosis of ALS, Myasthenia Gravis or Lambert-Eaton Syndrome.  Risks and benefits of injections discussed and pt agrees to proceed with the procedure.  Written consent obtained  These injections are medically necessary. Pt  receives good benefits from these injections. These injections do not cause sedations or hallucinations which the oral therapies may cause.   Description of procedure:  The patient was placed in a sitting position. The standard protocol was used for Botox  as follows, with 5 units of Botox  injected at each site:  -Procerus muscle, midline injection  -Corrugator muscle, bilateral injection  -Frontalis muscle, bilateral injection, with 2 sites each side, medial injection was performed in the upper one third of the frontalis muscle, in the region vertical from the medial inferior  edge of the superior orbital rim. The lateral injection was again in the upper one third of the forehead vertically above the lateral limbus of the cornea, 1.5 cm lateral to the medial injection site.  -Temporalis  muscle injection, 4 sites, bilaterally. The first injection was 3 cm above the tragus of the ear, second injection site was 1.5 cm to 3 cm up from the first injection site in line with the tragus of the ear. The third injection site was 1.5-3 cm forward between the first 2 injection sites. The fourth injection site was 1.5 cm posterior to the second injection site. 5th site laterally in the temporalis  muscleat the level of the outer canthus.  -Occipitalis muscle injection, 3 sites, bilaterally. The first injection was done one half way between the occipital protuberance and the tip of the mastoid process behind the ear. The second injection site was done lateral and superior to the first, 1 fingerbreadth from the first injection. The third injection site was 1 fingerbreadth superiorly and medially from the first injection site.  -Cervical paraspinal muscle injection, 2 sites, bilaterally. The first injection site was 1 cm from the midline of the cervical spine, 3 cm inferior to the lower border of the occipital protuberance. The second injection site was 1.5 cm superiorly and laterally to the first injection site.  -Trapezius muscle injection was performed at 3 sites, bilaterally. The first injection site was in the upper trapezius muscle halfway between the inflection point of the neck, and the acromion. The second injection site was one half way between the acromion and the first injection site. The third injection was done between the first injection site and the inflection point of the neck.   Will return for repeat injection in 3 months.   A total of 200 units of Botox  was prepared, 155 units of Botox  was injected as documented above, any Botox  not injected was wasted. The patient tolerated the procedure well, there were no complications of the above procedure.

## 2023-11-09 ENCOUNTER — Other Ambulatory Visit: Payer: Self-pay | Admitting: Family Medicine

## 2023-11-09 ENCOUNTER — Encounter: Payer: Self-pay | Admitting: Family Medicine

## 2023-11-09 MED ORDER — UBRELVY 100 MG PO TABS: 100.0000 mg | ORAL_TABLET | ORAL | 5 refills | Status: AC | PRN

## 2023-11-23 ENCOUNTER — Encounter: Payer: Self-pay | Admitting: Neurology

## 2023-12-17 ENCOUNTER — Telehealth: Payer: Self-pay | Admitting: Neurology

## 2023-12-17 NOTE — Telephone Encounter (Signed)
 Jillian from [facility] called to set up Botox  delivery   Botox  delivery date is  12/23/23 Quantity of 1  Units 200 For a 84 day supply

## 2023-12-31 ENCOUNTER — Telehealth: Payer: Self-pay | Admitting: Neurology

## 2023-12-31 NOTE — Telephone Encounter (Signed)
 Spoke with patient and rescheduled appt with Dr. Margaret for 02/02/24 at 9:30am

## 2023-12-31 NOTE — Telephone Encounter (Signed)
 LVM and sent mychart msg informing pt of need to reschedule 01/11/24 appt - MD departure

## 2023-12-31 NOTE — Telephone Encounter (Signed)
 Pt called returning call , Inform she will get call back , Pt would like you to call  work number   [phone]

## 2024-01-05 ENCOUNTER — Other Ambulatory Visit: Payer: Self-pay | Admitting: *Deleted

## 2024-01-05 DIAGNOSIS — G43009 Migraine without aura, not intractable, without status migrainosus: Secondary | ICD-10-CM

## 2024-01-05 MED ORDER — RIZATRIPTAN BENZOATE 10 MG PO TBDP: ORAL_TABLET | ORAL | 1 refills | Status: DC

## 2024-01-05 NOTE — Telephone Encounter (Signed)
 Received Rizatriptan refill request from [facility]. Will send to pt's next physician to review & approve.

## 2024-01-07 ENCOUNTER — Ambulatory Visit: Admitting: Family Medicine

## 2024-01-11 ENCOUNTER — Ambulatory Visit: Admitting: Neurology

## 2024-01-11 NOTE — Progress Notes (Unsigned)
 01/12/24 ALL: Janica returns for Botox . She continues to do well. Migraines are well managed. She averages about 3-4 migraine days a month. She has reduced topiramate to 50mg  every other day. She is monitoring intensity of migraines as she does feel they have been harder to treat. Abortive meds work well.   10/15/2023 ALL: Timothy returns for Botox . First procedure with Dr Ines 06/2023. She reports improvement in headaches for about 1-2 months. She has reduced topiramate to 50mg  daily. She uses either rizatriptan or Tosymra  with Ubrelvy  for abortive therapy about 4-5 times a month. Only takes propranolol  as needed when BP is elevated due to migraines.     Consent Form Botulism Toxin Injection For Chronic Migraine    Reviewed orally with patient, additionally signature is on file:  Botulism toxin has been approved by the Federal drug administration for treatment of chronic migraine. Botulism toxin does not cure chronic migraine and it may not be effective in some patients.  The administration of botulism toxin is accomplished by injecting a small amount of toxin into the muscles of the neck and head. Dosage must be titrated for each individual. Any benefits resulting from botulism toxin tend to wear off after 3 months with a repeat injection required if benefit is to be maintained. Injections are usually done every 3-4 months with maximum effect peak achieved by about 2 or 3 weeks. Botulism toxin is expensive and you should be sure of what costs you will incur resulting from the injection.  The side effects of botulism toxin use for chronic migraine may include:   -Transient, and usually mild, facial weakness with facial injections  -Transient, and usually mild, head or neck weakness with head/neck injections  -Reduction or loss of forehead facial animation due to forehead muscle weakness  -Eyelid drooping  -Dry eye  -Pain at the site of injection or bruising at the site of  injection  -Double vision  -Potential unknown long term risks   Contraindications: You should not have Botox  if you are pregnant, nursing, allergic to albumin, have an infection, skin condition, or muscle weakness at the site of the injection, or have myasthenia gravis, Lambert-Eaton syndrome, or ALS.  It is also possible that as with any injection, there may be an allergic reaction or no effect from the medication. Reduced effectiveness after repeated injections is sometimes seen and rarely infection at the injection site may occur. All care will be taken to prevent these side effects. If therapy is given over a long time, atrophy and wasting in the muscle injected may occur. Occasionally the patient's become refractory to treatment because they develop antibodies to the toxin. In this event, therapy needs to be modified.  I have read the above information and consent to the administration of botulism toxin.    BOTOX  PROCEDURE NOTE FOR MIGRAINE HEADACHE  Contraindications and precautions discussed with patient(above). Aseptic procedure was observed and patient tolerated procedure. Procedure performed by Greig Forbes, FNP-C.   The condition has existed for more than 6 months, and pt does not have a diagnosis of ALS, Myasthenia Gravis or Lambert-Eaton Syndrome.  Risks and benefits of injections discussed and pt agrees to proceed with the procedure.  Written consent obtained  These injections are medically necessary. Pt  receives good benefits from these injections. These injections do not cause sedations or hallucinations which the oral therapies may cause.   Description of procedure:  The patient was placed in a sitting position. The standard protocol was used  for Botox  as follows, with 5 units of Botox  injected at each site:  -Procerus muscle, midline injection  -Corrugator muscle, bilateral injection  -Frontalis muscle, bilateral injection, with 2 sites each side, medial injection was  performed in the upper one third of the frontalis muscle, in the region vertical from the medial inferior edge of the superior orbital rim. The lateral injection was again in the upper one third of the forehead vertically above the lateral limbus of the cornea, 1.5 cm lateral to the medial injection site.  -Temporalis muscle injection, 4 sites, bilaterally. The first injection was 3 cm above the tragus of the ear, second injection site was 1.5 cm to 3 cm up from the first injection site in line with the tragus of the ear. The third injection site was 1.5-3 cm forward between the first 2 injection sites. The fourth injection site was 1.5 cm posterior to the second injection site. 5th site laterally in the temporalis  muscleat the level of the outer canthus.  -Occipitalis muscle injection, 3 sites, bilaterally. The first injection was done one half way between the occipital protuberance and the tip of the mastoid process behind the ear. The second injection site was done lateral and superior to the first, 1 fingerbreadth from the first injection. The third injection site was 1 fingerbreadth superiorly and medially from the first injection site.  -Cervical paraspinal muscle injection, 2 sites, bilaterally. The first injection site was 1 cm from the midline of the cervical spine, 3 cm inferior to the lower border of the occipital protuberance. The second injection site was 1.5 cm superiorly and laterally to the first injection site.  -Trapezius muscle injection was performed at 3 sites, bilaterally. The first injection site was in the upper trapezius muscle halfway between the inflection point of the neck, and the acromion. The second injection site was one half way between the acromion and the first injection site. The third injection was done between the first injection site and the inflection point of the neck.   Will return for repeat injection in 3 months.   A total of 200 units of Botox  was prepared,  155 units of Botox  was injected as documented above, any Botox  not injected was wasted. The patient tolerated the procedure well, there were no complications of the above procedure.

## 2024-01-12 ENCOUNTER — Ambulatory Visit: Admitting: Family Medicine

## 2024-01-12 ENCOUNTER — Other Ambulatory Visit: Payer: Self-pay | Admitting: *Deleted

## 2024-01-12 VITALS — BP 120/84

## 2024-01-12 DIAGNOSIS — G43009 Migraine without aura, not intractable, without status migrainosus: Secondary | ICD-10-CM

## 2024-01-12 DIAGNOSIS — G43709 Chronic migraine without aura, not intractable, without status migrainosus: Secondary | ICD-10-CM

## 2024-01-12 MED ORDER — ONABOTULINUMTOXINA 200 UNITS IJ SOLR
155.0000 [IU] | Freq: Once | INTRAMUSCULAR | Status: AC
  Administered 2024-01-12: 155 [IU] via INTRAMUSCULAR

## 2024-01-12 MED ORDER — TOSYMRA 10 MG/ACT NA SOLN: 1.0000 | NASAL | 0 refills | Status: DC | PRN

## 2024-01-12 NOTE — Progress Notes (Signed)
 Botox - 200 units x 1 vial Lot: [id number] Expiration: 2027/06 NDC: 0023-3921-02  Bacteriostatic 0.9% Sodium Chloride - 4 mL  Lot: [id number] Expiration: 01/28/25 NDC: [id number]  Dx:  H56.290   S/P  Witnessed by JWEBB

## 2024-02-03 ENCOUNTER — Ambulatory Visit: Admitting: Diagnostic Neuroimaging

## 2024-02-03 ENCOUNTER — Encounter: Payer: Self-pay | Admitting: Diagnostic Neuroimaging

## 2024-02-03 VITALS — BP 138/90 | HR 87 | Ht 62.0 in | Wt 253.0 lb

## 2024-02-03 DIAGNOSIS — G43009 Migraine without aura, not intractable, without status migrainosus: Secondary | ICD-10-CM | POA: Diagnosis not present

## 2024-02-03 MED ORDER — EMGALITY 120 MG/ML ~~LOC~~ SOAJ: 120.0000 mg | SUBCUTANEOUS | 4 refills | Status: AC

## 2024-02-03 NOTE — Progress Notes (Unsigned)
 [facility]  PATIENT: [name] DOB: 1975-08-11  REFERRING CLINICIAN: Leonel Cole, MD HISTORY FROM: patient REASON FOR VISIT: new consult   HISTORICAL  CHIEF COMPLAINT:  Chief Complaint  Patient presents with   Headache    Rm 6 alone  Pt is well, reports she is doing well on botox . She may have at least 5 migraines a month now.  No other concerns.     HISTORY OF PRESENT ILLNESS:   48 year old female here for evaluation of headaches.  History of headaches starting around age 19 or 48 years old.  This typically started with her menstrual cycle.  She describes painful unilateral right or left sided headaches associated with sensitivity to sound with sharp sensations, nausea or vomiting.  No visual aura.  Triggers include weather changes, stress and hormonal cycle.  Over time headaches have continued but changed.  Now they consist of increasing throbbing sensation with headaches.  Has tried a variety of migraine prevention and rescue medications over the years.  August 2022 patient had an episode of left facial numbness without headache.  Nowadays patient having at least 5 migraine headaches per month.  She may have 5 to 10 days of total headache per month.    REVIEW OF SYSTEMS: Full 14 system review of systems performed and negative with exception of: As per HPI.  ALLERGIES: No Known Allergies  HOME MEDICATIONS: Outpatient Medications Prior to Visit  Medication Sig Dispense Refill   botulinum toxin Type A  (BOTOX ) 200 units injection Provider to inject 155 units into the muscles of the head and neck every 12 weeks. Discard remainder. 1 each 3   NUVARING 0.12-0.015 MG/24HR vaginal ring Place 1 each vaginally every 30 (thirty) days.     pantoprazole  (PROTONIX ) 40 MG tablet Take 1 tablet (40 mg total) by mouth daily. 90 tablet 1   promethazine (PHENERGAN) 25 MG tablet TAKE 1 TABLET EVERY 8 HOURS AS NEEDED FOR NAUSEA. 40 tablet 2   rizatriptan  (MAXALT-MLT) 10 MG disintegrating tablet TAKE 1 TABLET AS NEEDED FOR MIGRAINE. MAY REPEAT IN 2 HOURS IF NEEDED. MAX OF 3/24 HOURS. 12 tablet 1   saccharomyces boulardii (FLORASTOR) 250 MG capsule Take 1 capsule (250 mg total) by mouth 2 (two) times daily. 180 capsule 3   sertraline  (ZOLOFT ) 100 MG tablet Take 1.5 tablets (150 mg total) by mouth daily. PATIENT NEEDS OFFICE VISIT FOR ADDITIONAL REFILLS 45 tablet 0   SUMAtriptan  (TOSYMRA ) 10 MG/ACT SOLN Place 1 spray into the nose every hour as needed (max 3 doses per day). 6 each 0   topiramate (TOPAMAX) 100 MG tablet Take one tablet (100 mg dose) by mouth daily. (Patient taking differently: 50 mg every other day) 90 tablet 2   Ubrogepant  (UBRELVY ) 100 MG TABS Take 1 tablet (100 mg total) by mouth as needed. 16 tablet 5   propranolol  (INDERAL ) 20 MG tablet Take 1 tablet (20 mg total) by mouth 2 (two) times daily as needed. Take for migraine and HTN during migraines 60 tablet 6   rizatriptan (MAXALT) 10 MG tablet Take 10 mg by mouth as needed for migraine. May repeat in 2 hours if needed     No facility-administered medications prior to visit.      PHYSICAL EXAM  GENERAL EXAM/CONSTITUTIONAL: Vitals:  Vitals:   02/03/24 0909  BP: (!) 138/90  Pulse: 87  Weight: 253 lb (114.8 kg)  Height: 5' 2 (1.575 m)   Body mass index is 46.27 kg/m. Wt Readings from  Last 3 Encounters:  02/03/24 253 lb (114.8 kg)  04/21/23 229 lb (103.9 kg)  03/20/23 228 lb 8 oz (103.6 kg)   Patient is in no distress; well developed, nourished and groomed; neck is supple  CARDIOVASCULAR: Examination of carotid arteries is normal; no carotid bruits Regular rate and rhythm, no murmurs Examination of peripheral vascular system by observation and palpation is normal  EYES: Ophthalmoscopic exam of optic discs and posterior segments is normal; no papilledema or hemorrhages No results found.  MUSCULOSKELETAL: Gait, strength, tone, movements noted in Neurologic exam  below  NEUROLOGIC: MENTAL STATUS:      No data to display         awake, alert, oriented to person, place and time recent and remote memory intact normal attention and concentration language fluent, comprehension intact, naming intact fund of knowledge appropriate  CRANIAL NERVE:  2nd - no papilledema on fundoscopic exam 2nd, 3rd, 4th, 6th - pupils equal and reactive to light, visual fields full to confrontation, extraocular muscles intact, no nystagmus 5th - facial sensation symmetric 7th - facial strength symmetric 8th - hearing intact 9th - palate elevates symmetrically, uvula midline 11th - shoulder shrug symmetric 12th - tongue protrusion midline  MOTOR:  normal bulk and tone, full strength in the BUE, BLE  SENSORY:  normal and symmetric to light touch, temperature, vibration  COORDINATION:  finger-nose-finger, fine finger movements normal  REFLEXES:  deep tendon reflexes present and symmetric  GAIT/STATION:  narrow based gait     DIAGNOSTIC DATA (LABS, IMAGING, TESTING) - I reviewed patient records, labs, notes, testing and imaging myself where available.  Lab Results  Component Value Date   WBC 7.7 10/30/2020   HGB 13.8 10/30/2020   HCT 41.7 10/30/2020   MCV 86.7 10/30/2020   PLT 235 10/30/2020      Component Value Date/Time   NA 139 10/30/2020 1927   K 3.6 10/30/2020 1927   CL 110 10/30/2020 1927   CO2 19 (L) 10/30/2020 1927   GLUCOSE 114 (H) 10/30/2020 1927   BUN 12 10/30/2020 1927   CREATININE 0.84 10/30/2020 1927   CREATININE 0.98 09/08/2019 0924   CALCIUM 8.1 (L) 10/30/2020 1927   PROT 7.1 09/08/2019 0924   ALBUMIN 3.3 (L) 09/08/2019 0924   AST 16 09/08/2019 0924   ALT 19 09/08/2019 0924   ALKPHOS 94 09/08/2019 0924   BILITOT 0.2 (L) 09/08/2019 0924   GFRNONAA >60 10/30/2020 1927   GFRNONAA >60 09/08/2019 0924   GFRAA >60 09/08/2019 0924   No results found for: CHOL, HDL, LDLCALC, LDLDIRECT, TRIG, CHOLHDL No results  found for: HGBA1C No results found for: VITAMINB12 No results found for: TSH  10/30/20  - Normal head CT.   04/21/23 HST - This HST confirms the presence of snoring but not of sleep apnea.  There was also no hypoxia noted which would have explained the described symptom of morning headaches.    ASSESSMENT AND PLAN  48 y.o. year old female here with:   Medications tried: acetaminophen , zolmitriptan, rizatriptan, sumatriptan , Zofran /ondansetron , Celebrex, prednisone, venlafaxine/Effexor, Zoloft , Migrelief, topiramate, ubrelvy , emgality, aimovig , propranolol  (hypotension)  Dx:  1. Migraine without aura and without status migrainosus, not intractable     PLAN:  MIGRAINE WITHOUT AURA (now ~5-10 days HA / month; previously ~15 days per month)  MIGRAINE PREVENTION  LIFESTYLE CHANGES -Stop or avoid smoking -Decrease or avoid caffeine / alcohol -Eat and sleep on a regular schedule -Exercise several times per week  - continue botox  injections every  3 months - consider to stop topiramate 50mg  every other night  - stop propranolol  (not usually regularly) - start galazanezumab (Emgality) 240mg  loading dose; then 120mg  monthly   MIGRAINE RESCUE  - continue ibuprofen , tylenol  as needed - continue rizatriptan (Maxalt) 10mg  as needed for breakthrough headache; may repeat x 1 after 2 hours; max 2 tabs per day or 8 per month - continue tosymra  (sumatriptan  spray 10mg ) as needed - continue ubrogepant  (Ubrelvy ) 100mg  as needed for breakthrough headache; may repeat x 1 after 2 hours; max 2 tabs per day or 8 per month - consider rimegepant (Nurtec) 75mg  as needed for breakthrough headache; max 8 per month  Meds ordered this encounter  Medications   Galcanezumab-gnlm (EMGALITY) 120 MG/ML SOAJ    Sig: Inject 120 mg into the skin every 30 (thirty) days. Start 240mg  injection loading dose x 1; after 1 month start 120mg  monthly injections    Dispense:  4 mL    Refill:  4   Return in  about 3 months (around 05/05/2024) for with NP (Amy Lomax) for botox .    EDUARD FABIENE HANLON, MD 02/03/2024, 10:27 AM Certified in Neurology, Neurophysiology and Neuroimaging  [facility] [address] [place], [place] [address] [phone]

## 2024-02-03 NOTE — Patient Instructions (Signed)
 MIGRAINE PREVENTION  LIFESTYLE CHANGES -Stop or avoid smoking -Decrease or avoid caffeine / alcohol -Eat and sleep on a regular schedule -Exercise several times per week  - continue botox  injections every 3 months - consider to stop topiramate 50mg  every other night  - stop propranolol  (not usually regularly) - start galazanezumab (Emgality) 240mg  loading dose; then 120mg  monthly   MIGRAINE RESCUE  - continue ibuprofen , tylenol  as needed - continue rizatriptan (Maxalt) 10mg  as needed for breakthrough headache; may repeat x 1 after 2 hours; max 2 tabs per day or 8 per month - continue tosymra  (sumatriptan  spray 10mg ) as needed - continue ubrogepant  (Ubrelvy ) 100mg  as needed for breakthrough headache; may repeat x 1 after 2 hours; max 2 tabs per day or 8 per month - consider rimegepant (Nurtec) 75mg  as needed for breakthrough headache

## 2024-02-11 ENCOUNTER — Other Ambulatory Visit: Payer: Self-pay | Admitting: Diagnostic Neuroimaging

## 2024-02-11 ENCOUNTER — Other Ambulatory Visit: Payer: Self-pay | Admitting: *Deleted

## 2024-02-11 DIAGNOSIS — G43009 Migraine without aura, not intractable, without status migrainosus: Secondary | ICD-10-CM

## 2024-02-11 DIAGNOSIS — G43709 Chronic migraine without aura, not intractable, without status migrainosus: Secondary | ICD-10-CM

## 2024-02-11 MED ORDER — TOSYMRA 10 MG/ACT NA SOLN: 1.0000 | NASAL | 2 refills | Status: DC | PRN

## 2024-02-11 MED ORDER — TOSYMRA 10 MG/ACT NA SOLN: 1.0000 | NASAL | 0 refills | Status: DC | PRN

## 2024-02-14 LAB — SIGNATERA ONLY (NATERA MANAGED)
SIGNATERA MTM READOUT: 0 MTM/ml
SIGNATERA TEST RESULT: NEGATIVE

## 2024-02-15 ENCOUNTER — Encounter: Payer: Self-pay | Admitting: Family Medicine

## 2024-02-23 ENCOUNTER — Other Ambulatory Visit (HOSPITAL_COMMUNITY): Payer: Self-pay

## 2024-02-23 ENCOUNTER — Telehealth: Payer: Self-pay

## 2024-02-23 NOTE — Telephone Encounter (Signed)
 Clinical questions have been answered and PA submitted. PA currently Pending. Please be advised that most companies allow up to 30 days to make a decision. We will advise when a determination has been made, or follow up in 1 week.   Please reach out to our team, Rx Prior Auth Pool, if you haven't heard back in a week.

## 2024-02-23 NOTE — Telephone Encounter (Signed)
 Pharmacy Patient Advocate Encounter   Received notification from CoverMyMeds that prior authorization for Emgality  loaidng dose 120mg /ml autoinjector is required/requested.   Insurance verification completed.   The patient is insured through [facility].   Per test claim: PA required; PA started via CoverMyMeds. KEY [id number] . Waiting for clinical questions to populate.

## 2024-03-02 ENCOUNTER — Other Ambulatory Visit (HOSPITAL_COMMUNITY): Payer: Self-pay

## 2024-03-02 NOTE — Telephone Encounter (Signed)
 Pharmacy Patient Advocate Encounter  Received notification from [employer] that Prior Authorization for Emgality  has been APPROVED from 02/29/2024 to 03/08/2024. Ran test claim, Copay is $35.00 for 2ml loading dose. This test claim was processed through [facility]- copay amounts may vary at other pharmacies due to pharmacy/plan contracts, or as the patient moves through the different stages of their insurance plan.   PA #/Case ID/Reference #:  [id number]

## 2024-03-03 ENCOUNTER — Other Ambulatory Visit: Payer: Self-pay | Admitting: *Deleted

## 2024-03-03 DIAGNOSIS — G43709 Chronic migraine without aura, not intractable, without status migrainosus: Secondary | ICD-10-CM

## 2024-03-03 MED ORDER — ONABOTULINUMTOXINA 200 UNITS IJ SOLR: INTRAMUSCULAR | 3 refills | Status: AC

## 2024-03-08 ENCOUNTER — Ambulatory Visit: Attending: Hematology and Oncology | Admitting: Hematology and Oncology

## 2024-03-08 VITALS — BP 102/86 | HR 115 | Temp 97.6°F | Resp 18 | Ht 62.0 in | Wt 260.2 lb

## 2024-03-08 DIAGNOSIS — Z853 Personal history of malignant neoplasm of breast: Secondary | ICD-10-CM | POA: Diagnosis present

## 2024-03-08 DIAGNOSIS — C50511 Malignant neoplasm of lower-outer quadrant of right female breast: Secondary | ICD-10-CM

## 2024-03-08 DIAGNOSIS — Z923 Personal history of irradiation: Secondary | ICD-10-CM | POA: Insufficient documentation

## 2024-03-08 DIAGNOSIS — Z171 Estrogen receptor negative status [ER-]: Secondary | ICD-10-CM | POA: Diagnosis not present

## 2024-03-08 DIAGNOSIS — I890 Lymphedema, not elsewhere classified: Secondary | ICD-10-CM | POA: Insufficient documentation

## 2024-03-08 NOTE — Progress Notes (Signed)
 Patient Care Team: Leonel Cole, MD as PCP - General (Family Medicine) Odean Potts, MD as Consulting Physician (Hematology and Oncology) Dewey Rush, MD as Consulting Physician (Radiation Oncology) Vanderbilt Ned, MD as Consulting Physician (General Surgery) Margaret Eduard SAUNDERS, MD as Consulting Physician (Neurology)  DIAGNOSIS:  Encounter Diagnosis  Name Primary?   Malignant neoplasm of lower-outer quadrant of right breast of female, estrogen receptor negative (HCC) Yes    SUMMARY OF ONCOLOGIC HISTORY: Oncology History  Malignant neoplasm of lower-outer quadrant of right breast of female, estrogen receptor negative (HCC)  09/27/2018 Cancer Staging   Staging form: Breast, AJCC 8th Edition - Clinical stage from 09/27/2018: Stage IB (cT1b, cN0, cM0, G3, ER-, PR-, HER2-) - Signed by Crawford Morna Pickle, NP on 10/06/2018   10/06/2018 Initial Diagnosis   Screening mammogram detected right breast mass. Diagnostic mammogram showed 8mm mass with adjacent 3mm mass in the right breast with no axillary adenopathy. 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  The report date is 10/13/2018.  The Common Hereditary Gene Panel offered by [facility] includes sequencing and/or deletion duplication testing of the following 48 genes: APC, ATM, AXIN2, BARD1, BMPR1A, BRCA1, BRCA2, BRIP1, CDH1, CDK4, CDKN2A (p14ARF), CDKN2A (p16INK4a), CHEK2, CTNNA1, DICER1, EPCAM (Deletion/duplication testing only), GREM1 (promoter region deletion/duplication testing only), KIT, MEN1, MLH1, MSH2, MSH3, MSH6, MUTYH, NBN, NF1, NHTL1, PALB2, PDGFRA, PMS2, POLD1, POLE, PTEN, RAD50, RAD51C, RAD51D, RNF43, SDHB, SDHC, SDHD, SMAD4, SMARCA4. STK11, TP53, TSC1, TSC2, and VHL.  The following  genes were evaluated for sequence changes only: SDHA and HOXB13 c.251G>A variant only.    11/03/2018 Surgery   Right lumpectomy ([place]) [phone]): IDC, 0.5cm, grade 3, clear margins, 2 axillary lymph nodes negative.  Triple negative with a Ki-67 of 90%   11/10/2018 Cancer Staging   Staging form: Breast, AJCC 8th Edition - Pathologic: Stage IB (pT1a, pN0, cM0, G3, ER-, PR-, HER2-) - Signed by Odean Potts, MD on 11/10/2018   12/28/2018 - 02/10/2019 Radiation Therapy   The patient initially received a dose of 50.4 Gy in 28 fractions to the breast using whole-breast tangent fields. This was delivered using a 3-D conformal technique. The patient then received a boost to the seroma. This delivered an additional 10 Gy in 5 fractions using a 3-field photon boost technique. The total dose was 60.4 Gy.     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  She is five and a half years from initial diagnosis and treatment for right breast cancer. She is asymptomatic with no new pain, breast mass, or breast discomfort. 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 Her most recent Signatera minimal residual disease blood test was negative, and her surveillance mammogram in June was unremarkable. She has not had genetic testing. Her cancer was detected by routine mammogram, and there is no family history of hereditary cancer syndromes.  She has mild right-sided lymphedema that she manages at home with a pump as needed. She notes occasional fullness in the affected area  without significant discomfort or functional limitation. She has a stable surgical scar and no new symptoms related  to prior treatments.  Mar 20, 2023: Surveillance visit for history of right breast IDC/DCIS, stage IB, triple negative; breast exam and surveillance mammogram were benign, Signatera testing negative for minimal residual disease. No breast pain or discomfort reported, ECOG performance status 1, advised to continue regular exercise and return as needed.     ALLERGIES:  has no known allergies.  MEDICATIONS:  Current Outpatient Medications  Medication Sig Dispense Refill   botulinum toxin Type A  (BOTOX ) 200 units injection Provider to inject 155 units into the muscles of the head and neck every 12 weeks. Discard remainder. 1 each 3   Galcanezumab -gnlm (EMGALITY ) 120 MG/ML SOAJ Inject 120 mg into the skin every 30 (thirty) days. Start 240mg  injection loading dose x 1; after 1 month start 120mg  monthly injections 4 mL 4   NUVARING 0.12-0.015 MG/24HR vaginal ring Place 1 each vaginally every 30 (thirty) days.     pantoprazole  (PROTONIX ) 40 MG tablet Take 1 tablet (40 mg total) by mouth daily. 90 tablet 1   promethazine (PHENERGAN) 25 MG tablet TAKE 1 TABLET EVERY 8 HOURS AS NEEDED FOR NAUSEA. 40 tablet 2   rizatriptan  (MAXALT -MLT) 10 MG disintegrating tablet TAKE 1 TABLET AS NEEDED FOR MIGRAINE. MAY REPEAT IN 2 HOURS IF NEEDED. MAX OF 3/24 HOURS. 12 tablet 1   saccharomyces boulardii (FLORASTOR) 250 MG capsule Take 1 capsule (250 mg total) by mouth 2 (two) times daily. 180 capsule 3   sertraline  (ZOLOFT ) 100 MG tablet Take 1.5 tablets (150 mg total) by mouth daily. PATIENT NEEDS OFFICE VISIT FOR ADDITIONAL REFILLS 45 tablet 0   SUMAtriptan  (TOSYMRA ) 10 MG/ACT SOLN Place 1 spray into the nose every hour as needed (max 3 doses per day). 6 each 2   topiramate (TOPAMAX) 100 MG tablet Take one tablet (100 mg dose) by mouth daily. (Patient taking differently: 50 mg every other day) 90 tablet 2   Ubrogepant  (UBRELVY ) 100 MG TABS Take 1 tablet (100 mg total) by mouth as needed. 16 tablet 5   No current  facility-administered medications for this visit.    PHYSICAL EXAMINATION: ECOG PERFORMANCE STATUS: 1 - Symptomatic but completely ambulatory  There were no vitals filed for this visit. There were no vitals filed for this visit.  Physical Exam Breast exam: No palpable lumps or nodules in bilateral breasts or axilla     (exam performed in the presence of a chaperone)  LABORATORY DATA:  I have reviewed the data as listed    Latest Ref Rng & Units 10/30/2020    7:27 PM 09/08/2019    9:24 AM 11/01/2018   11:30 AM  CMP  Glucose 70 - 99 mg/dL 885  98  893   BUN 6 - 20 mg/dL 12  10  10    Creatinine 0.44 - 1.00 mg/dL 9.15  9.01  9.02   Sodium 135 - 145 mmol/L 139  139  139   Potassium 3.5 - 5.1 mmol/L 3.6  4.2  3.7   Chloride 98 - 111 mmol/L 110  110  109   CO2 22 - 32 mmol/L 19  19  20    Calcium 8.9 - 10.3 mg/dL 8.1  9.3  9.0   Total Protein 6.5 - 8.1 g/dL  7.1  6.7   Total Bilirubin 0.3 - 1.2 mg/dL  0.2  0.2   Alkaline Phos 38 - 126 U/L  94  86  AST 15 - 41 U/L  16  17   ALT 0 - 44 U/L  19  15     Lab Results  Component Value Date   WBC 7.7 10/30/2020   HGB 13.8 10/30/2020   HCT 41.7 10/30/2020   MCV 86.7 10/30/2020   PLT 235 10/30/2020   NEUTROABS 4.9 10/30/2020    ASSESSMENT & PLAN:  Malignant neoplasm of lower-outer quadrant of right breast of female, estrogen receptor negative (HCC) 10/06/2018:Screening mammogram detected right breast mass. Diagnostic mammogram showed 8mm mass with adjacent 3mm mass in the right breast with no axillary adenopathy. 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    Recommendations: 1. Breast conserving surgery 11/03/2018: IDC 0.5 cm, grade 3, 0/2 lymph nodes negative, triple negative, Ki-67 90% 2. 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 ---------------------------------------------------------------------------------------------------------------------------------------- Breast Cancer Surveillance: 1. Mammogram: 09/25/2023: Benign, density cat C 2. Breast Exam: 03/08/2024 benign 3.  Signatera 02/14/2024: Negative   Back pain and abdominal discomfort: CT CAP 09/16/2019: No evidence of metastatic disease. Recommended continuing Signatera for minimal residual disease detection.    We discussed the role of breast MRIs for surveillance.  Radiology recommended breast MRI given her young age.  We discussed the pros and cons of breast MRI and felt that it was not necessary in her case.  General Health Maintenance -Encouraged to incorporate regular exercise, even short duration, into daily routine.     Return to clinic in 1 year for follow-up     No orders of the defined types were placed in this encounter.  The patient has a good understanding of the overall plan. she agrees with it. she will call with any problems that may develop before the next visit here.  I personally spent a total of 30 minutes in the care of the patient today including preparing to see the patient, getting/reviewing separately obtained history, performing a medically appropriate exam/evaluation, counseling and educating, placing orders, referring and communicating with other health care professionals, documenting clinical information in the EHR, independently interpreting results, communicating results, and coordinating care.   Viinay K Chancy Smigiel, MD 03/08/24

## 2024-03-08 NOTE — Assessment & Plan Note (Addendum)
 10/06/2018:Screening mammogram detected right breast mass. Diagnostic mammogram showed 8mm mass with adjacent 3mm mass in the right breast with no axillary adenopathy. 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    Recommendations: 1. Breast conserving surgery 11/03/2018: IDC 0.5 cm, grade 3, 0/2 lymph nodes negative, triple negative, Ki-67 90% 2. 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 ---------------------------------------------------------------------------------------------------------------------------------------- Breast Cancer Surveillance: 1. Mammogram: 09/25/2023: Benign, density cat C 2. Breast Exam: 03/08/2024 benign 3.  Signatera 02/14/2024: Negative   Back pain and abdominal discomfort: CT CAP 09/16/2019: No evidence of metastatic disease. Recommended continuing Signatera for minimal residual disease detection.    General Health Maintenance -Encouraged to incorporate regular exercise, even short duration, into daily routine.     Return to clinic in 1 year for follow-up

## 2024-03-09 ENCOUNTER — Telehealth: Payer: Self-pay | Admitting: Diagnostic Neuroimaging

## 2024-03-09 NOTE — Telephone Encounter (Signed)
 Joven @ [facility] [phone] has called to schedule delivery for 12/11 address provided content of delivery: Botox  SDV 200 units, quantity of 1, 84 days

## 2024-03-25 ENCOUNTER — Other Ambulatory Visit: Payer: Self-pay | Admitting: Diagnostic Neuroimaging

## 2024-03-25 DIAGNOSIS — G43009 Migraine without aura, not intractable, without status migrainosus: Secondary | ICD-10-CM

## 2024-03-28 NOTE — Telephone Encounter (Signed)
 Last OV 02/03/24 Next OV 04/19/24  Last refill 01/05/24 Qty #12/1

## 2024-04-05 ENCOUNTER — Telehealth: Payer: Self-pay | Admitting: Diagnostic Neuroimaging

## 2024-04-05 NOTE — Telephone Encounter (Signed)
 [facility] Manager called stating they gave the patient the wrong dose on 03/30/24. A nurse did infrom them that Dr. Margaret was out, but to give the patient their missing dose since it was their mistake. The patient called today and was informed that the pharmacy made a mistake and she will need to get a new prior authorization in order to get her missing dose. I call the pharmacy and informed them since the patient picked up the injection on 03/04/24 and there PA was approved until 03/08/24 they owe her a pen and she should not need to get a new PA for her to get her missing dose. Patient had a headache yesterday and is due for her next injection 04/06/24. She was able to pick up her injection on 04/01/24 for tomorrow's does.   - the pharmacy plans to call me back after they speak to their third party source about the mistake and see if they can correct it. If she is able to get the extra pen she will only do one injection as directed

## 2024-04-05 NOTE — Telephone Encounter (Signed)
 This can go to CMA. Pt has order for Emgality .

## 2024-04-05 NOTE — Telephone Encounter (Signed)
 Pt called to request to speak to Nurse the patient believe that  Pharmacy gave wrong medication  . And would like to discuss what  she is suppose to have Galcanezumab -gnlm (EMGALITY ) 120 MG/ML [facility] [place], [place] - [address] (Ph: [phone])

## 2024-04-11 NOTE — Telephone Encounter (Signed)
 Patient was able to pick up missing pen on the 6th per the pharmamcy.

## 2024-04-18 ENCOUNTER — Ambulatory Visit: Admitting: Family Medicine

## 2024-04-19 ENCOUNTER — Encounter: Payer: Self-pay | Admitting: Family Medicine

## 2024-04-19 ENCOUNTER — Ambulatory Visit: Admitting: Family Medicine

## 2024-04-19 DIAGNOSIS — G43709 Chronic migraine without aura, not intractable, without status migrainosus: Secondary | ICD-10-CM

## 2024-04-19 DIAGNOSIS — G43009 Migraine without aura, not intractable, without status migrainosus: Secondary | ICD-10-CM

## 2024-04-19 MED ORDER — ONABOTULINUMTOXINA 200 UNITS IJ SOLR
155.0000 [IU] | Freq: Once | INTRAMUSCULAR | Status: AC
  Administered 2024-04-19: 155 [IU] via INTRAMUSCULAR

## 2024-04-19 NOTE — Progress Notes (Signed)
 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

## 2024-04-19 NOTE — Progress Notes (Signed)
 "   04/19/24 ALL: [name] returns for Botox . Migraines remain well managed. She may have 3-4 per month. She is taking topiramate every other day. Rizatriptan , Tosymra  and phenergan help with abortive therapy.   01/12/2024 ALL: [name] returns for Botox . She continues to do well. Migraines are well managed. She averages about 3-4 migraine days a month. She has reduced topiramate to 50mg  every other day. She is monitoring intensity of migraines as she does feel they have been harder to treat. Abortive meds work well.   10/15/2023 ALL: [name] returns for Botox . First procedure with Dr Ines 06/2023. She reports improvement in headaches for about 1-2 months. She has reduced topiramate to 50mg  daily. She uses either rizatriptan  or Tosymra  with Ubrelvy  for abortive therapy about 4-5 times a month. Only takes propranolol  as needed when BP is elevated due to migraines.     Consent Form Botulism Toxin Injection For Chronic Migraine    Reviewed orally with patient, additionally signature is on file:  Botulism toxin has been approved by the Federal drug administration for treatment of chronic migraine. Botulism toxin does not cure chronic migraine and it may not be effective in some patients.  The administration of botulism toxin is accomplished by injecting a small amount of toxin into the muscles of the neck and head. Dosage must be titrated for each individual. Any benefits resulting from botulism toxin tend to wear off after 3 months with a repeat injection required if benefit is to be maintained. Injections are usually done every 3-4 months with maximum effect peak achieved by about 2 or 3 weeks. Botulism toxin is expensive and you should be sure of what costs you will incur resulting from the injection.  The side effects of botulism toxin use for chronic migraine may include:   -Transient, and usually mild, facial weakness with facial injections  -Transient, and usually mild, head or neck weakness  with head/neck injections  -Reduction or loss of forehead facial animation due to forehead muscle weakness  -Eyelid drooping  -Dry eye  -Pain at the site of injection or bruising at the site of injection  -Double vision  -Potential unknown long term risks   Contraindications: You should not have Botox  if you are pregnant, nursing, allergic to albumin, have an infection, skin condition, or muscle weakness at the site of the injection, or have myasthenia gravis, Lambert-Eaton syndrome, or ALS.  It is also possible that as with any injection, there may be an allergic reaction or no effect from the medication. Reduced effectiveness after repeated injections is sometimes seen and rarely infection at the injection site may occur. All care will be taken to prevent these side effects. If therapy is given over a long time, atrophy and wasting in the muscle injected may occur. Occasionally the patient's become refractory to treatment because they develop antibodies to the toxin. In this event, therapy needs to be modified.  I have read the above information and consent to the administration of botulism toxin.    BOTOX  PROCEDURE NOTE FOR MIGRAINE HEADACHE  Contraindications and precautions discussed with patient(above). Aseptic procedure was observed and patient tolerated procedure. Procedure performed by Greig Forbes, FNP-C.   The condition has existed for more than 6 months, and pt does not have a diagnosis of ALS, Myasthenia Gravis or Lambert-Eaton Syndrome.  Risks and benefits of injections discussed and pt agrees to proceed with the procedure.  Written consent obtained  These injections are medically necessary. Pt  receives good benefits from these  injections. These injections do not cause sedations or hallucinations which the oral therapies may cause.   Description of procedure:  The patient was placed in a sitting position. The standard protocol was used for Botox  as follows, with 5 units of  Botox  injected at each site:  -Procerus muscle, midline injection  -Corrugator muscle, bilateral injection  -Frontalis muscle, bilateral injection, with 2 sites each side, medial injection was performed in the upper one third of the frontalis muscle, in the region vertical from the medial inferior edge of the superior orbital rim. The lateral injection was again in the upper one third of the forehead vertically above the lateral limbus of the cornea, 1.5 cm lateral to the medial injection site.  -Temporalis muscle injection, 4 sites, bilaterally. The first injection was 3 cm above the tragus of the ear, second injection site was 1.5 cm to 3 cm up from the first injection site in line with the tragus of the ear. The third injection site was 1.5-3 cm forward between the first 2 injection sites. The fourth injection site was 1.5 cm posterior to the second injection site. 5th site laterally in the temporalis  muscleat the level of the outer canthus.  -Occipitalis muscle injection, 3 sites, bilaterally. The first injection was done one half way between the occipital protuberance and the tip of the mastoid process behind the ear. The second injection site was done lateral and superior to the first, 1 fingerbreadth from the first injection. The third injection site was 1 fingerbreadth superiorly and medially from the first injection site.  -Cervical paraspinal muscle injection, 2 sites, bilaterally. The first injection site was 1 cm from the midline of the cervical spine, 3 cm inferior to the lower border of the occipital protuberance. The second injection site was 1.5 cm superiorly and laterally to the first injection site.  -Trapezius muscle injection was performed at 3 sites, bilaterally. The first injection site was in the upper trapezius muscle halfway between the inflection point of the neck, and the acromion. The second injection site was one half way between the acromion and the first injection site.  The third injection was done between the first injection site and the inflection point of the neck.   Will return for repeat injection in 3 months.   A total of 200 units of Botox  was prepared, 155 units of Botox  was injected as documented above, any Botox  not injected was wasted. The patient tolerated the procedure well, there were no complications of the above procedure.  "

## 2024-05-03 ENCOUNTER — Other Ambulatory Visit: Payer: Self-pay

## 2024-05-03 DIAGNOSIS — G43709 Chronic migraine without aura, not intractable, without status migrainosus: Secondary | ICD-10-CM

## 2024-05-03 MED ORDER — TOSYMRA 10 MG/ACT NA SOLN: 1.0000 | NASAL | 2 refills | Status: AC | PRN

## 2024-05-03 NOTE — Telephone Encounter (Signed)
 continue tosymra  (sumatriptan  spray 10mg ) as needed  Last seen 02/03/2024  Next office visit 08/01/24 Last filled 04/08/2024 through [facility] not [place]. Patient wants it back through [facility]

## 2024-08-01 ENCOUNTER — Ambulatory Visit: Admitting: Family Medicine

## 2025-03-08 ENCOUNTER — Inpatient Hospital Stay: Admitting: Hematology and Oncology
